# Patient Record
Sex: Female | Born: 1940 | Race: White | Hispanic: No | Smoking: Current every day smoker
Health system: Southern US, Community
[De-identification: ages and names within clinical notes are randomized; demographics above are authoritative.]

## PROBLEM LIST (undated history)

## (undated) DIAGNOSIS — G8929 Other chronic pain: Secondary | ICD-10-CM

## (undated) DIAGNOSIS — N183 Chronic kidney disease, stage 3 unspecified: Secondary | ICD-10-CM

## (undated) DIAGNOSIS — I10 Essential (primary) hypertension: Secondary | ICD-10-CM

## (undated) DIAGNOSIS — K8020 Calculus of gallbladder without cholecystitis without obstruction: Secondary | ICD-10-CM

## (undated) DIAGNOSIS — K635 Polyp of colon: Secondary | ICD-10-CM

## (undated) DIAGNOSIS — G4700 Insomnia, unspecified: Secondary | ICD-10-CM

## (undated) DIAGNOSIS — G629 Polyneuropathy, unspecified: Secondary | ICD-10-CM

## (undated) DIAGNOSIS — I4891 Unspecified atrial fibrillation: Secondary | ICD-10-CM

## (undated) DIAGNOSIS — I499 Cardiac arrhythmia, unspecified: Secondary | ICD-10-CM

## (undated) DIAGNOSIS — F419 Anxiety disorder, unspecified: Secondary | ICD-10-CM

## (undated) DIAGNOSIS — M1990 Unspecified osteoarthritis, unspecified site: Secondary | ICD-10-CM

## (undated) DIAGNOSIS — F32A Depression, unspecified: Secondary | ICD-10-CM

## (undated) DIAGNOSIS — F329 Major depressive disorder, single episode, unspecified: Secondary | ICD-10-CM

## (undated) DIAGNOSIS — M549 Dorsalgia, unspecified: Secondary | ICD-10-CM

## (undated) HISTORY — DX: Calculus of gallbladder without cholecystitis without obstruction: K80.20

## (undated) HISTORY — DX: Essential (primary) hypertension: I10

## (undated) HISTORY — DX: Anxiety disorder, unspecified: F41.9

## (undated) HISTORY — DX: Chronic kidney disease, stage 3 unspecified: N18.30

## (undated) HISTORY — DX: Insomnia, unspecified: G47.00

## (undated) HISTORY — DX: Cardiac arrhythmia, unspecified: I49.9

## (undated) HISTORY — PX: ABDOMINAL HYSTERECTOMY: SHX81

## (undated) HISTORY — DX: Depression, unspecified: F32.A

## (undated) HISTORY — DX: Chronic kidney disease, stage 3 (moderate): N18.3

## (undated) HISTORY — DX: Unspecified atrial fibrillation: I48.91

## (undated) HISTORY — DX: Polyneuropathy, unspecified: G62.9

## (undated) HISTORY — DX: Polyp of colon: K63.5

## (undated) HISTORY — DX: Dorsalgia, unspecified: M54.9

## (undated) HISTORY — DX: Major depressive disorder, single episode, unspecified: F32.9

## (undated) HISTORY — DX: Other chronic pain: G89.29

---

## 2004-12-12 ENCOUNTER — Ambulatory Visit: Payer: Self-pay

## 2004-12-15 ENCOUNTER — Ambulatory Visit: Payer: Self-pay

## 2005-11-21 ENCOUNTER — Ambulatory Visit: Payer: Self-pay

## 2007-01-13 ENCOUNTER — Emergency Department: Payer: Self-pay | Admitting: Emergency Medicine

## 2007-01-17 ENCOUNTER — Ambulatory Visit (HOSPITAL_COMMUNITY): Admission: RE | Admit: 2007-01-17 | Discharge: 2007-01-17 | Payer: Self-pay | Admitting: *Deleted

## 2007-03-04 ENCOUNTER — Ambulatory Visit: Payer: Self-pay | Admitting: Anesthesiology

## 2007-04-01 ENCOUNTER — Ambulatory Visit: Payer: Self-pay | Admitting: Anesthesiology

## 2007-05-05 ENCOUNTER — Ambulatory Visit: Payer: Self-pay | Admitting: Anesthesiology

## 2007-06-18 ENCOUNTER — Ambulatory Visit: Payer: Self-pay | Admitting: Anesthesiology

## 2007-07-02 ENCOUNTER — Ambulatory Visit: Payer: Self-pay | Admitting: *Deleted

## 2007-07-28 ENCOUNTER — Ambulatory Visit: Payer: Self-pay | Admitting: Anesthesiology

## 2007-09-16 ENCOUNTER — Ambulatory Visit: Payer: Self-pay | Admitting: Anesthesiology

## 2007-11-12 ENCOUNTER — Ambulatory Visit: Payer: Self-pay | Admitting: *Deleted

## 2007-11-24 ENCOUNTER — Ambulatory Visit: Payer: Self-pay | Admitting: Gastroenterology

## 2008-05-24 ENCOUNTER — Ambulatory Visit (HOSPITAL_COMMUNITY): Admission: RE | Admit: 2008-05-24 | Discharge: 2008-05-24 | Payer: Self-pay | Admitting: Neurosurgery

## 2008-06-10 ENCOUNTER — Ambulatory Visit (HOSPITAL_COMMUNITY): Admission: RE | Admit: 2008-06-10 | Discharge: 2008-06-10 | Payer: Self-pay | Admitting: Neurosurgery

## 2008-07-09 LAB — HM DEXA SCAN

## 2010-06-04 ENCOUNTER — Encounter: Payer: Self-pay | Admitting: Neurosurgery

## 2010-11-08 ENCOUNTER — Observation Stay: Payer: Self-pay | Admitting: Internal Medicine

## 2010-12-05 ENCOUNTER — Emergency Department: Payer: Self-pay | Admitting: *Deleted

## 2011-02-08 ENCOUNTER — Ambulatory Visit: Payer: Self-pay | Admitting: Urology

## 2011-02-16 ENCOUNTER — Ambulatory Visit: Payer: Self-pay | Admitting: Urology

## 2012-07-18 ENCOUNTER — Encounter: Payer: Self-pay | Admitting: Family Medicine

## 2012-07-18 DIAGNOSIS — F329 Major depressive disorder, single episode, unspecified: Secondary | ICD-10-CM | POA: Insufficient documentation

## 2012-08-04 ENCOUNTER — Telehealth: Payer: Self-pay | Admitting: Family Medicine

## 2012-08-04 NOTE — Telephone Encounter (Signed)
[facility]

## 2012-08-04 NOTE — Telephone Encounter (Signed)
Ok to refill 

## 2012-08-13 ENCOUNTER — Telehealth: Payer: Self-pay | Admitting: Family Medicine

## 2012-08-13 NOTE — Telephone Encounter (Signed)
?  ok to refill °

## 2012-08-13 NOTE — Telephone Encounter (Signed)
Ok to refill on Friday 4/4.  Too soon today

## 2012-08-14 ENCOUNTER — Encounter: Payer: Self-pay | Admitting: Family Medicine

## 2012-08-15 MED ORDER — HYDROCODONE-ACETAMINOPHEN 5-325 MG PO TABS: 1.0000 | ORAL_TABLET | Freq: Two times a day (BID) | ORAL | Status: DC

## 2012-08-15 NOTE — Telephone Encounter (Signed)
rx refilled.

## 2012-08-15 NOTE — Telephone Encounter (Signed)
This encounter was created in error - please disregard.

## 2012-08-17 ENCOUNTER — Other Ambulatory Visit: Payer: Self-pay | Admitting: Family Medicine

## 2012-09-04 ENCOUNTER — Telehealth: Payer: Self-pay | Admitting: Family Medicine

## 2012-09-04 MED ORDER — DIAZEPAM 5 MG PO TABS: 5.0000 mg | ORAL_TABLET | Freq: Two times a day (BID) | ORAL | Status: DC | PRN

## 2012-09-04 NOTE — Telephone Encounter (Signed)
?   OK to Refill  

## 2012-09-04 NOTE — Telephone Encounter (Signed)
Ok to refill 

## 2012-09-04 NOTE — Telephone Encounter (Signed)
Rx Refilled  

## 2012-09-05 ENCOUNTER — Encounter: Payer: Self-pay | Admitting: Family Medicine

## 2012-09-05 NOTE — Telephone Encounter (Signed)
This encounter was created in error - please disregard.

## 2012-09-14 ENCOUNTER — Other Ambulatory Visit: Payer: Self-pay | Admitting: Family Medicine

## 2012-09-15 NOTE — Telephone Encounter (Signed)
?   OK to Refill  

## 2012-09-15 NOTE — Telephone Encounter (Signed)
Ok to refill if it has been 1 month. 

## 2012-10-13 ENCOUNTER — Other Ambulatory Visit: Payer: Self-pay | Admitting: Family Medicine

## 2012-10-13 NOTE — Telephone Encounter (Signed)
Ok to refill 

## 2012-10-13 NOTE — Telephone Encounter (Signed)
?   OK to Refill  

## 2012-10-24 ENCOUNTER — Other Ambulatory Visit: Payer: Self-pay | Admitting: Family Medicine

## 2012-10-24 NOTE — Telephone Encounter (Signed)
Ok to refill 

## 2012-10-24 NOTE — Telephone Encounter (Signed)
?   OK to Refill  

## 2012-10-27 ENCOUNTER — Encounter: Payer: Self-pay | Admitting: Family Medicine

## 2012-10-27 ENCOUNTER — Ambulatory Visit (INDEPENDENT_AMBULATORY_CARE_PROVIDER_SITE_OTHER): Payer: Medicare Other | Admitting: Family Medicine

## 2012-10-27 VITALS — BP 100/62 | HR 78 | Temp 98.2°F | Resp 16 | Wt 172.0 lb

## 2012-10-27 DIAGNOSIS — R630 Anorexia: Secondary | ICD-10-CM

## 2012-10-27 DIAGNOSIS — R300 Dysuria: Secondary | ICD-10-CM

## 2012-10-27 DIAGNOSIS — M25559 Pain in unspecified hip: Secondary | ICD-10-CM

## 2012-10-27 LAB — URINALYSIS, ROUTINE W REFLEX MICROSCOPIC
Ketones, ur: NEGATIVE mg/dL
Nitrite: NEGATIVE
Protein, ur: NEGATIVE mg/dL
Specific Gravity, Urine: 1.02 (ref 1.005–1.030)
Urobilinogen, UA: 0.2 mg/dL (ref 0.0–1.0)

## 2012-10-27 LAB — URINALYSIS, MICROSCOPIC ONLY: Crystals: NONE SEEN

## 2012-10-27 NOTE — Progress Notes (Signed)
Subjective:    Patient ID: Catherine Orozco, female    DOB: 01/24/1941, 72 y.o.   MRN: [id number]  HPI Patient presents with 2 weeks of urinary frequency and hesitancy and mild dysuria. She denies fevers chills or CVA tenderness. She does have some suprapubic discomfort. Urinalysis has shown below and is relatively benign. Office Visit on 10/27/2012  Component Date Value Range Status  . Color, Urine 10/27/2012 YELLOW  YELLOW Final  . APPearance 10/27/2012 CLEAR  CLEAR Final  . Specific Gravity, Urine 10/27/2012 1.020  1.005 - 1.030 Final  . pH 10/27/2012 6.0  5.0 - 8.0 Final  . Glucose, UA 10/27/2012 NEG  NEG mg/dL Final  . Bilirubin Urine 10/27/2012 NEG  NEG Final  . Ketones, ur 10/27/2012 NEG  NEG mg/dL Final  . Hgb urine dipstick 10/27/2012 TRACE* NEG Final  . Protein, ur 10/27/2012 NEG  NEG mg/dL Final  . Urobilinogen, UA 10/27/2012 0.2  0.0 - 1.0 mg/dL Final  . Nitrite 40/98/1191 NEG  NEG Final  . Leukocytes, UA 10/27/2012 NEG  NEG Final  . Squamous Epithelial / LPF 10/27/2012 RARE  RARE Final  . Crystals 10/27/2012 NONE SEEN  NONE SEEN Final  . Casts 10/27/2012 Granular casts noted  NONE SEEN Final  . WBC, UA 10/27/2012 0-2  <3 WBC/hpf Final  . RBC / HPF 10/27/2012 0-2  <3 RBC/hpf Final  . Bacteria, UA 10/27/2012 RARE  RARE Final    However she is also complaining of fatigue, loss of appetite, and severe pain in her right hip. On exam the area she is referring to as her hip however is the superior iliac crest in her right pelvis. It is extremely tender to palpation. She has no pain with range of motion in the right hip. She is also complaining of some right-sided lower back pain.  The patient winces when I palpate the right anterior superior iliac spine. Past Medical History  Diagnosis Date  . Anxiety   . Depression   . 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 Hypertension   . Insomnia   . Peripheral neuropathy     no lumbar radiculopathy   Current Outpatient Prescriptions  on File Prior to Visit  Medication Sig Dispense Refill  . amLODipine (NORVASC) 5 MG tablet Take 5 mg by mouth daily.      . diazepam (VALIUM) 5 MG tablet TAKE 1 TABLET BY MOUTH TWICE A DAY  60 tablet  0  . gabapentin (NEURONTIN) 600 MG tablet Take 600 mg by mouth 2 (two) times daily.      . hydrochlorothiazide (HYDRODIURIL) 25 MG tablet Take 25 mg by mouth daily.      Marland Kitchen HYDROcodone-acetaminophen (NORCO/VICODIN) 5-325 MG per tablet TAKE 1 TABLET BY MOUTH TWICE A DAY  60 tablet  0  . traZODone (DESYREL) 50 MG tablet Take 50 mg by mouth at bedtime. 1-2 qhs prn       No current facility-administered medications on file prior to visit.   Allergies  Allergen Reactions  . Paxil (Paroxetine Hcl)     Can't sleep   History   Social History  . Marital Status: Widowed    Spouse Name: N/A    Number of Children: N/A  . Years of Education: N/A   Occupational History  . Not on file.   Social History Main Topics  . Smoking status: Current Every Day Smoker -- 1.00 packs/day  . Smokeless tobacco: Current User  . Alcohol Use: No  .  Drug Use: No  . Sexually Active: Not on file   Other Topics Concern  . Not on file   Social History Narrative  . No narrative on file     Review of Systems  All other systems reviewed and are negative.       Objective:   Physical Exam  Vitals reviewed. Constitutional: She appears well-developed and well-nourished.  Neck: Neck supple. No thyromegaly present.  Cardiovascular: Normal rate, regular rhythm and normal heart sounds.   No murmur heard. Pulmonary/Chest: Effort normal and breath sounds normal. No respiratory distress. She has no wheezes. She has no rales.  Abdominal: Soft. Bowel sounds are normal. She exhibits no distension. There is no tenderness. There is no rebound and no guarding.  Musculoskeletal:       Right hip: She exhibits tenderness (tender to palpation of the right anterior superior iliac spine) and bony tenderness. She exhibits  normal range of motion, normal strength, no swelling, no crepitus, no deformity and no laceration.  Lymphadenopathy:    She has no cervical adenopathy.          Assessment & Plan:  1. Dysuria Urinalysis is negative, I will await the results of the urine culture. - Urinalysis, Routine w reflex microscopic - Urine culture  2. Pain in joint, pelvic region and thigh, unspecified laterality The patient seems to be having bony pain within the pelvis itself.  I will obtain an x-ray of the pelvis and both hips to rule out skeletal neoplasm or possible occult fracture. - DG Hip Bilateral W/Pelvis; Future - Urine culture  3. Anorexia Given her poor appetite all began with baseline labs including a CMP, CBC and sedimentation rate. I will await the results of these studies prior to formulating a further plan of care. - COMPLETE METABOLIC PANEL WITH GFR - CBC with Differential - Sedimentation rate

## 2012-10-28 ENCOUNTER — Ambulatory Visit
Admission: RE | Admit: 2012-10-28 | Discharge: 2012-10-28 | Disposition: A | Discharge status: Home / Self Care | Payer: Medicare Other | Source: Ambulatory Visit | Attending: Family Medicine | Admitting: Family Medicine

## 2012-10-28 DIAGNOSIS — M25559 Pain in unspecified hip: Secondary | ICD-10-CM

## 2012-10-28 LAB — COMPLETE METABOLIC PANEL WITH GFR
AST: 27 U/L (ref 0–37)
Albumin: 4.2 g/dL (ref 3.5–5.2)
Alkaline Phosphatase: 100 U/L (ref 39–117)
BUN: 14 mg/dL (ref 6–23)
GFR, Est Non African American: 59 mL/min — ABNORMAL LOW
Glucose, Bld: 100 mg/dL — ABNORMAL HIGH (ref 70–99)
Potassium: 3.5 mEq/L (ref 3.5–5.3)
Total Bilirubin: 0.3 mg/dL (ref 0.3–1.2)

## 2012-10-28 LAB — CBC WITH DIFFERENTIAL/PLATELET
Eosinophils Relative: 1 % (ref 0–5)
HCT: 43.4 % (ref 36.0–46.0)
Hemoglobin: 15.4 g/dL — ABNORMAL HIGH (ref 12.0–15.0)
Lymphocytes Relative: 36 % (ref 12–46)
Lymphs Abs: 4.2 10*3/uL — ABNORMAL HIGH (ref 0.7–4.0)
MCV: 86.8 fL (ref 78.0–100.0)
Monocytes Absolute: 0.9 10*3/uL (ref 0.1–1.0)
Monocytes Relative: 7 % (ref 3–12)
Neutro Abs: 6.5 10*3/uL (ref 1.7–7.7)
RBC: 5 MIL/uL (ref 3.87–5.11)
WBC: 11.7 10*3/uL — ABNORMAL HIGH (ref 4.0–10.5)

## 2012-10-30 ENCOUNTER — Other Ambulatory Visit: Payer: Self-pay | Admitting: Family Medicine

## 2012-10-30 MED ORDER — MELOXICAM 15 MG PO TABS: 15.0000 mg | ORAL_TABLET | Freq: Every day | ORAL | Status: DC

## 2012-10-30 NOTE — Telephone Encounter (Signed)
rx filled

## 2012-11-10 ENCOUNTER — Other Ambulatory Visit: Payer: Self-pay | Admitting: Family Medicine

## 2012-11-11 NOTE — Telephone Encounter (Signed)
LOV note and XRay of Pelvis, Hips reviewed.  Approved. # 60 / 0 refills

## 2012-11-11 NOTE — Telephone Encounter (Signed)
?   OK to Refill  

## 2012-11-12 NOTE — Telephone Encounter (Signed)
Med refilled.

## 2012-11-18 ENCOUNTER — Other Ambulatory Visit: Payer: Self-pay | Admitting: Family Medicine

## 2012-11-25 ENCOUNTER — Ambulatory Visit: Payer: Medicare Other | Admitting: Family Medicine

## 2012-11-27 ENCOUNTER — Encounter: Payer: Self-pay | Admitting: Family Medicine

## 2012-11-27 ENCOUNTER — Ambulatory Visit (INDEPENDENT_AMBULATORY_CARE_PROVIDER_SITE_OTHER): Payer: Medicare Other | Admitting: Family Medicine

## 2012-11-27 VITALS — BP 110/68 | HR 74 | Temp 98.3°F | Resp 18 | Wt 174.0 lb

## 2012-11-27 DIAGNOSIS — I4891 Unspecified atrial fibrillation: Secondary | ICD-10-CM

## 2012-11-27 DIAGNOSIS — I499 Cardiac arrhythmia, unspecified: Secondary | ICD-10-CM

## 2012-11-27 LAB — COMPLETE METABOLIC PANEL WITH GFR
BUN: 18 mg/dL (ref 6–23)
CO2: 25 mEq/L (ref 19–32)
Calcium: 10 mg/dL (ref 8.4–10.5)
Chloride: 101 mEq/L (ref 96–112)
Creat: 1.18 mg/dL — ABNORMAL HIGH (ref 0.50–1.10)
GFR, Est African American: 54 mL/min — ABNORMAL LOW
Total Bilirubin: 0.4 mg/dL (ref 0.3–1.2)

## 2012-11-27 LAB — CBC WITH DIFFERENTIAL/PLATELET
Basophils Absolute: 0.1 10*3/uL (ref 0.0–0.1)
Eosinophils Absolute: 0.2 10*3/uL (ref 0.0–0.7)
Eosinophils Relative: 2 % (ref 0–5)
HCT: 43.2 % (ref 36.0–46.0)
Lymphocytes Relative: 36 % (ref 12–46)
Lymphs Abs: 3.8 10*3/uL (ref 0.7–4.0)
MCH: 31.1 pg (ref 26.0–34.0)
MCV: 86.1 fL (ref 78.0–100.0)
Monocytes Absolute: 0.8 10*3/uL (ref 0.1–1.0)
Platelets: 249 10*3/uL (ref 150–400)
RDW: 13 % (ref 11.5–15.5)
WBC: 10.6 10*3/uL — ABNORMAL HIGH (ref 4.0–10.5)

## 2012-11-27 LAB — TSH: TSH: 2.006 u[IU]/mL (ref 0.350–4.500)

## 2012-11-27 MED ORDER — METOPROLOL TARTRATE 25 MG PO TABS: 25.0000 mg | ORAL_TABLET | Freq: Two times a day (BID) | ORAL | Status: DC

## 2012-11-27 MED ORDER — APIXABAN 5 MG PO TABS: 5.0000 mg | ORAL_TABLET | Freq: Two times a day (BID) | ORAL | Status: DC

## 2012-11-27 NOTE — Progress Notes (Signed)
Subjective:    Patient ID: Catherine Orozco, female    DOB: Nov 10, 1940, 72 y.o.   MRN: [id number]  HPI 10/27/12 Patient presents with 2 weeks of urinary frequency and hesitancy and mild dysuria. She denies fevers chills or CVA tenderness. She does have some suprapubic discomfort. Urinalysis has shown below and is relatively benign. No visits with results within 1 Day(s) from this visit. Latest known visit with results is:  Office Visit on 10/27/2012  Component Date Value Range Status  . Color, Urine 10/27/2012 YELLOW  YELLOW Final  . APPearance 10/27/2012 CLEAR  CLEAR Final  . Specific Gravity, Urine 10/27/2012 1.020  1.005 - 1.030 Final  . pH 10/27/2012 6.0  5.0 - 8.0 Final  . Glucose, UA 10/27/2012 NEG  NEG mg/dL Final  . Bilirubin Urine 10/27/2012 NEG  NEG Final  . Ketones, ur 10/27/2012 NEG  NEG mg/dL Final  . Hgb urine dipstick 10/27/2012 TRACE* NEG Final  . Protein, ur 10/27/2012 NEG  NEG mg/dL Final  . Urobilinogen, UA 10/27/2012 0.2  0.0 - 1.0 mg/dL Final  . Nitrite 40/98/1191 NEG  NEG Final  . Leukocytes, UA 10/27/2012 NEG  NEG Final  . Sodium 10/27/2012 139  135 - 145 mEq/L Final  . Potassium 10/27/2012 3.5  3.5 - 5.3 mEq/L Final  . Chloride 10/27/2012 101  96 - 112 mEq/L Final  . CO2 10/27/2012 27  19 - 32 mEq/L Final  . Glucose, Bld 10/27/2012 100* 70 - 99 mg/dL Final  . BUN 47/82/9562 14  6 - 23 mg/dL Final  . Creat 13/12/6576 0.97  0.50 - 1.10 mg/dL Final  . Total Bilirubin 10/27/2012 0.3  0.3 - 1.2 mg/dL Final  . Alkaline Phosphatase 10/27/2012 100  39 - 117 U/L Final  . AST 10/27/2012 27  0 - 37 U/L Final  . ALT 10/27/2012 18  0 - 35 U/L Final  . Total Protein 10/27/2012 6.6  6.0 - 8.3 g/dL Final  . Albumin 46/96/2952 4.2  3.5 - 5.2 g/dL Final  . Calcium 84/13/2440 9.6  8.4 - 10.5 mg/dL Final  . GFR, Est African American 10/27/2012 68   Final  . GFR, Est Non African American 10/27/2012 59*  Final   Comment:                            The estimated GFR is a  calculation valid for adults (>=35 years old)                          that uses the CKD-EPI algorithm to adjust for age and sex. It is                            not to be used for children, pregnant women, hospitalized patients,                             patients on dialysis, or with rapidly changing kidney function.                          According to the NKDEP, eGFR >89 is normal, 60-89 shows mild                          impairment, 30-59 shows moderate impairment,  15-29 shows severe                          impairment and <15 is ESRD.                             . WBC 10/27/2012 11.7* 4.0 - 10.5 K/uL Final  . RBC 10/27/2012 5.00  3.87 - 5.11 MIL/uL Final  . Hemoglobin 10/27/2012 15.4* 12.0 - 15.0 g/dL Final  . HCT 40/98/1191 43.4  36.0 - 46.0 % Final  . MCV 10/27/2012 86.8  78.0 - 100.0 fL Final  . MCH 10/27/2012 30.8  26.0 - 34.0 pg Final  . MCHC 10/27/2012 35.5  30.0 - 36.0 g/dL Final  . RDW 47/82/9562 14.1  11.5 - 15.5 % Final  . Platelets 10/27/2012 292  150 - 400 K/uL Final  . Neutrophils Relative % 10/27/2012 56  43 - 77 % Final  . Neutro Abs 10/27/2012 6.5  1.7 - 7.7 K/uL Final  . Lymphocytes Relative 10/27/2012 36  12 - 46 % Final  . Lymphs Abs 10/27/2012 4.2* 0.7 - 4.0 K/uL Final  . Monocytes Relative 10/27/2012 7  3 - 12 % Final  . Monocytes Absolute 10/27/2012 0.9  0.1 - 1.0 K/uL Final  . Eosinophils Relative 10/27/2012 1  0 - 5 % Final  . Eosinophils Absolute 10/27/2012 0.1  0.0 - 0.7 K/uL Final  . Basophils Relative 10/27/2012 0  0 - 1 % Final  . Basophils Absolute 10/27/2012 0.1  0.0 - 0.1 K/uL Final  . Smear Review 10/27/2012 Criteria for review not met   Final  . Sed Rate 10/27/2012 13  0 - 22 mm/hr Final  . Squamous Epithelial / LPF 10/27/2012 RARE  RARE Final  . Crystals 10/27/2012 NONE SEEN  NONE SEEN Final  . Casts 10/27/2012 Granular casts noted  NONE SEEN Final  . WBC, UA 10/27/2012 0-2  <3 WBC/hpf Final  . RBC / HPF 10/27/2012 0-2  <3 RBC/hpf Final   . Bacteria, UA 10/27/2012 RARE  RARE Final  . Colony Count 10/27/2012 NO GROWTH   Final  . Organism ID, Bacteria 10/27/2012 NO GROWTH   Final    However she is also complaining of fatigue, loss of appetite, and severe pain in her right hip. On exam the area she is referring to as her hip however is the superior iliac crest in her right pelvis. It is extremely tender to palpation. She has no pain with range of motion in the right hip. She is also complaining of some right-sided lower back pain.  The patient winces when I palpate the right anterior superior iliac spine.  11/27/2012 X-ray was normal except for some degenerative changes in the right hip.  We started the patient on meloxicam 15 mg by mouth daily for her hip pain. She states that the pain in her right hip is now essentially resolved. She has full range of motion without pain in the hip. However on her physical exam today there is a coincidental finding of an irregular heartbeat. Past Medical History  Diagnosis Date  . Anxiety   . Depression   . 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 Hypertension   . Insomnia   . Peripheral neuropathy     no lumbar radiculopathy   Current Outpatient Prescriptions on File Prior to Visit  Medication Sig Dispense Refill  . amLODipine (NORVASC)  5 MG tablet Take 5 mg by mouth daily.      . diazepam (VALIUM) 5 MG tablet TAKE 1 TABLET BY MOUTH TWICE A DAY  60 tablet  0  . gabapentin (NEURONTIN) 600 MG tablet Take 600 mg by mouth 2 (two) times daily.      . hydrochlorothiazide (HYDRODIURIL) 25 MG tablet Take 25 mg by mouth daily.      Marland Kitchen HYDROcodone-acetaminophen (NORCO/VICODIN) 5-325 MG per tablet TAKE 1 TABLET BY MOUTH TWICE A DAY AS NEEDED  60 tablet  0  . hydrOXYzine (ATARAX/VISTARIL) 25 MG tablet TAKE 1 TABLET BY MOUTH EVERY EVENING AT BEDTIME (DISCONTINUE TRAZODOONE)  30 tablet  2  . meloxicam (MOBIC) 15 MG tablet Take 1 tablet (15 mg total) by mouth daily.  30 tablet  2  . pyridOXINE (VITAMIN  B-6) 100 MG tablet Take 100 mg by mouth daily.      . traZODone (DESYREL) 50 MG tablet Take 50 mg by mouth at bedtime. 1-2 qhs prn      . vitamin E 400 UNIT capsule Take 400 Units by mouth daily.       No current facility-administered medications on file prior to visit.   Allergies  Allergen Reactions  . Paxil (Paroxetine Hcl)     Can't sleep   History   Social History  . Marital Status: Widowed    Spouse Name: N/A    Number of Children: N/A  . Years of Education: N/A   Occupational History  . Not on file.   Social History Main Topics  . Smoking status: Current Every Day Smoker -- 1.00 packs/day  . Smokeless tobacco: Current User  . Alcohol Use: No  . Drug Use: No  . Sexually Active: Not on file   Other Topics Concern  . Not on file   Social History Narrative  . No narrative on file     Review of Systems  All other systems reviewed and are negative.       Objective:   Physical Exam  Vitals reviewed. Constitutional: She appears well-developed and well-nourished.  Neck: Neck supple. No thyromegaly present.  Cardiovascular: Normal rate and normal heart sounds.  An irregular rhythm present.  No murmur heard. Pulmonary/Chest: Effort normal and breath sounds normal. No respiratory distress. She has no wheezes. She has no rales.  Abdominal: Soft. Bowel sounds are normal. She exhibits no distension. There is no tenderness. There is no rebound and no guarding.  Musculoskeletal:       Right hip: She exhibits normal range of motion, normal strength, no tenderness, no swelling, no crepitus, no deformity and no laceration.  Lymphadenopathy:    She has no cervical adenopathy.    EKG shows an irregularly irregular rhythm with a rate of 102 beats per minute no discernible PR interval normal axis without ischemia or infarction. There are definite P waves but they are irregular in morphology with variable PP intervals.  I cannot rule out frequent PACs but I believe the patient  is in atrial fib.      Assessment & Plan:  1. Irregular heart beat - EKG 12-Lead  2. Atrial fibrillation Begin Eliquis 5 mg pobid.  Discontinue Mobic.  Begin Lopressor 25 mg twice a day.  Consult cardiology for an echocardiogram. - CBC with Differential - COMPLETE METABOLIC PANEL WITH GFR - TSH - metoprolol tartrate (LOPRESSOR) 25 MG tablet; Take 1 tablet (25 mg total) by mouth 2 (two) times daily.  Dispense: 180 tablet; Refill: 3

## 2012-12-01 ENCOUNTER — Other Ambulatory Visit: Payer: Self-pay | Admitting: Family Medicine

## 2012-12-02 NOTE — Telephone Encounter (Signed)
Ok to refill 

## 2012-12-02 NOTE — Telephone Encounter (Signed)
Med phoned in °

## 2012-12-10 ENCOUNTER — Ambulatory Visit: Payer: Medicare Other | Admitting: Cardiology

## 2012-12-10 ENCOUNTER — Other Ambulatory Visit: Payer: Self-pay | Admitting: Family Medicine

## 2012-12-10 NOTE — Telephone Encounter (Signed)
?   OK to Refill  

## 2012-12-11 NOTE — Telephone Encounter (Signed)
Ok to refill 

## 2012-12-11 NOTE — Telephone Encounter (Signed)
Med refilled.

## 2012-12-23 ENCOUNTER — Ambulatory Visit (INDEPENDENT_AMBULATORY_CARE_PROVIDER_SITE_OTHER): Payer: Medicare Other | Admitting: Cardiovascular Disease

## 2012-12-23 ENCOUNTER — Encounter: Payer: Self-pay | Admitting: Cardiovascular Disease

## 2012-12-23 VITALS — BP 112/70 | HR 57 | Ht 65.0 in | Wt 170.1 lb

## 2012-12-23 DIAGNOSIS — I4891 Unspecified atrial fibrillation: Secondary | ICD-10-CM

## 2012-12-23 DIAGNOSIS — F17200 Nicotine dependence, unspecified, uncomplicated: Secondary | ICD-10-CM | POA: Insufficient documentation

## 2012-12-23 DIAGNOSIS — I10 Essential (primary) hypertension: Secondary | ICD-10-CM

## 2012-12-23 DIAGNOSIS — Z79899 Other long term (current) drug therapy: Secondary | ICD-10-CM

## 2012-12-23 HISTORY — DX: Unspecified atrial fibrillation: I48.91

## 2012-12-23 NOTE — Assessment & Plan Note (Addendum)
We had a long discussion about atrial fibrillation . Asymptomatic paroxysmal atrial fibrillation, last seen on EKG in July 2014. Back in normal sinus rhythm today.  We will order an echocardiogram. Encouraged her to stay on eliquis and we have given samples, coupon card, and patient assistance paperwork. Suggested she also look at the Price of pradaxa or xarelto with her pharmacist.

## 2012-12-23 NOTE — Assessment & Plan Note (Signed)
We have encouraged her to continue to work on weaning her cigarettes and smoking cessation. She will continue to work on this and does not want any assistance with chantix.  

## 2012-12-23 NOTE — Patient Instructions (Addendum)
Please ask the pharmacy which is the cheapest: Eliquis, pradaxa or xarelto  Please cut the amlodipine in 1/2 daily If blood pressure continues to drop low, hold the amlodipine  We will order an echocardiogram for atrial fibrillation Lab draw when you come in for echocardiogram  Please call us if you have new issues that need to be addressed before your next appt.  Your physician wants you to follow-up in: 6 months.  You will receive a reminder letter in the mail two months in advance. If you don't receive a letter, please call our office to schedule the follow-up appointment.

## 2012-12-23 NOTE — Progress Notes (Signed)
Patient ID: Catherine Orozco, female    DOB: 10/29/1940, 72 y.o.   MRN: [id number]  HPI Comments: Catherine Orozco is a very pleasant 72 year old woman with a history of smoking for 50 years, lower extremity neuropathy, DJD per the patient/chronic back pain,  strong family history of heart disease with father who passed away from MI at age 66, mother with stroke in her 71s, who presents by referral from Dr. Tanya Nones  for atrial fibrillation.  She was recently seen in his clinic in July 2014 with an EKG showing atrial fibrillation. She was asymptomatic per the patient. She denies any lower extremity edema, shortness of breath, orthopnea, chest pain. She is unaware that she was having arrhythmia. She presents today with her daughter. Daughter reports that mother has otherwise been doing well, is relatively active with no complaints.   At her office visit with Dr. Tanya Nones him a she was started on metoprolol 25 mg twice a day, eliquis 5 mg twice a day. She reports that she is unable to afford the eliquis as it is $70.  EKG today shows normal sinus rhythm with rate 57 beats per minute, no significant ST or T wave changes    Outpatient Encounter Prescriptions as of 12/23/2012  Medication Sig Dispense Refill  . amLODipine (NORVASC) 5 MG tablet Take 5 mg by mouth daily.      Marland Kitchen apixaban (ELIQUIS) 5 MG TABS tablet Take 1 tablet (5 mg total) by mouth 2 (two) times daily.  60 tablet  5  . diazepam (VALIUM) 5 MG tablet TAKE 1 TABLET BY MOUTH TWICE A DAY  60 tablet  0  . gabapentin (NEURONTIN) 600 MG tablet Take 600 mg by mouth 2 (two) times daily.      . hydrochlorothiazide (HYDRODIURIL) 25 MG tablet Take 25 mg by mouth daily.      Marland Kitchen HYDROcodone-acetaminophen (NORCO/VICODIN) 5-325 MG per tablet TAKE 1 TABLET BY MOUTH TWICE A DAY AS NEEDED  60 tablet  0  . hydrOXYzine (ATARAX/VISTARIL) 25 MG tablet TAKE 1 TABLET BY MOUTH EVERY EVENING AT BEDTIME (DISCONTINUE TRAZODOONE)  30 tablet  2  . metoprolol tartrate  (LOPRESSOR) 25 MG tablet Take 1 tablet (25 mg total) by mouth 2 (two) times daily.  180 tablet  3  . pyridOXINE (VITAMIN B-6) 100 MG tablet Take 100 mg by mouth daily.      . vitamin E 400 UNIT capsule Take 400 Units by mouth daily.      . [DISCONTINUED] traZODone (DESYREL) 50 MG tablet Take 50 mg by mouth at bedtime. 1-2 qhs prn       No facility-administered encounter medications on file as of 12/23/2012.     Review of Systems  Constitutional: Negative.   HENT: Negative.   Eyes: Negative.   Respiratory: Negative.   Cardiovascular: Negative.   Gastrointestinal: Negative.   Musculoskeletal: Negative.   Skin: Negative.   Neurological: Negative.   Psychiatric/Behavioral: Negative.   All other systems reviewed and are negative.    BP 112/70  Pulse 57  Ht 5\' 5"  (1.651 m)  Wt 170 lb 1.9 oz (77.166 kg)  BMI 28.31 kg/m2  Physical Exam  Nursing note and vitals reviewed. Constitutional: She is oriented to person, place, and time. She appears well-developed and well-nourished.  HENT:  Head: Normocephalic.  Nose: Nose normal.  Mouth/Throat: Oropharynx is clear and moist.  Eyes: Conjunctivae are normal. Pupils are equal, round, and reactive to light.  Neck: Normal range of motion. Neck supple. No JVD  present.  Cardiovascular: Normal rate, regular rhythm, S1 normal, S2 normal, normal heart sounds and intact distal pulses.  Exam reveals no gallop and no friction rub.   No murmur heard. Pulmonary/Chest: Effort normal and breath sounds normal. No respiratory distress. She has no wheezes. She has no rales. She exhibits no tenderness.  Abdominal: Soft. Bowel sounds are normal. She exhibits no distension. There is no tenderness.  Musculoskeletal: Normal range of motion. She exhibits no edema and no tenderness.  Lymphadenopathy:    She has no cervical adenopathy.  Neurological: She is alert and oriented to person, place, and time. Coordination normal.  Skin: Skin is warm and dry. No rash  noted. No erythema.  Psychiatric: She has a normal mood and affect. Her behavior is normal. Judgment and thought content normal.    Assessment and Plan

## 2012-12-23 NOTE — Assessment & Plan Note (Signed)
Blood pressure running on the low side. We will decrease her amlodipine down to 2.5 mg daily. If blood pressure continues to run low, could hold the amlodipine. This could potentially be contributing to her foot swelling.

## 2013-01-05 ENCOUNTER — Other Ambulatory Visit: Payer: Self-pay | Admitting: Family Medicine

## 2013-01-05 NOTE — Telephone Encounter (Signed)
ok 

## 2013-01-05 NOTE — Telephone Encounter (Signed)
?   OK to Refill  

## 2013-01-07 ENCOUNTER — Other Ambulatory Visit: Payer: Self-pay | Admitting: Family Medicine

## 2013-01-08 ENCOUNTER — Telehealth: Payer: Self-pay | Admitting: Family Medicine

## 2013-01-08 NOTE — Telephone Encounter (Signed)
Has form for patient assistance for Eluquis.  Told to drop off and we can complete and fax to company.

## 2013-01-08 NOTE — Telephone Encounter (Signed)
Last OV 11/27/12  Last refill 12/10/12  #60  OK refill?

## 2013-01-08 NOTE — Telephone Encounter (Signed)
rx called in

## 2013-01-08 NOTE — Telephone Encounter (Signed)
ok 

## 2013-01-23 ENCOUNTER — Ambulatory Visit (INDEPENDENT_AMBULATORY_CARE_PROVIDER_SITE_OTHER): Payer: Medicare Other

## 2013-01-23 ENCOUNTER — Other Ambulatory Visit: Payer: Self-pay

## 2013-01-23 ENCOUNTER — Other Ambulatory Visit (INDEPENDENT_AMBULATORY_CARE_PROVIDER_SITE_OTHER): Payer: Medicare Other

## 2013-01-23 DIAGNOSIS — I4891 Unspecified atrial fibrillation: Secondary | ICD-10-CM

## 2013-01-23 DIAGNOSIS — Z79899 Other long term (current) drug therapy: Secondary | ICD-10-CM

## 2013-01-24 LAB — LIPID PANEL
Cholesterol, Total: 237 mg/dL — ABNORMAL HIGH (ref 100–199)
HDL: 38 mg/dL — ABNORMAL LOW (ref >39)
Triglycerides: 637 mg/dL (ref 0–149)

## 2013-01-27 ENCOUNTER — Telehealth: Payer: Self-pay

## 2013-01-27 NOTE — Telephone Encounter (Signed)
Spoke w/ Gunnar Fusi. Will leave samples of Eliquis 5 mg at the front desk.   Will call with echo results after Dr. Mariah Milling has reviewed.

## 2013-01-27 NOTE — Telephone Encounter (Signed)
Pt would like samples of Eliquis, also would like echo results.

## 2013-01-30 ENCOUNTER — Telehealth: Payer: Self-pay | Admitting: Family Medicine

## 2013-01-30 MED ORDER — APIXABAN 5 MG PO TABS: 5.0000 mg | ORAL_TABLET | Freq: Two times a day (BID) | ORAL | Status: DC

## 2013-01-30 NOTE — Telephone Encounter (Signed)
rx was printed and faxed

## 2013-01-30 NOTE — Telephone Encounter (Signed)
Eliquis 5 mg 1 BID #180  This needs to be sent to [facility], [id number], [place], [place] [address], [phone], [phone]. (Sorry, I could not find it in the pharmacy list)

## 2013-02-02 ENCOUNTER — Telehealth: Payer: Self-pay

## 2013-02-02 NOTE — Telephone Encounter (Signed)
Message copied by Marilynne Halsted on Mon Feb 02, 2013  8:20 AM ------      Message from: Antonieta Iba      Created: Thu Jan 29, 2013 10:23 PM       Recent Normal echo ------

## 2013-02-04 ENCOUNTER — Telehealth: Payer: Self-pay

## 2013-02-04 NOTE — Telephone Encounter (Signed)
Pt notified of normal ECHO results.

## 2013-02-06 ENCOUNTER — Encounter: Payer: Self-pay | Admitting: Family Medicine

## 2013-02-06 ENCOUNTER — Other Ambulatory Visit: Payer: Self-pay | Admitting: Family Medicine

## 2013-02-06 NOTE — Telephone Encounter (Signed)
ok 

## 2013-02-06 NOTE — Telephone Encounter (Signed)
?   OK to Refill  

## 2013-02-16 ENCOUNTER — Other Ambulatory Visit: Payer: Self-pay | Admitting: Family Medicine

## 2013-02-16 NOTE — Telephone Encounter (Signed)
?   OK to Refill  

## 2013-02-16 NOTE — Telephone Encounter (Signed)
ok 

## 2013-02-17 ENCOUNTER — Other Ambulatory Visit: Payer: Self-pay | Admitting: Family Medicine

## 2013-02-17 NOTE — Telephone Encounter (Signed)
Just refilled yesterday.  Pharmacy called never received, verbal order given

## 2013-02-27 ENCOUNTER — Other Ambulatory Visit: Payer: Self-pay | Admitting: Physician Assistant

## 2013-02-27 ENCOUNTER — Telehealth: Payer: Self-pay

## 2013-02-27 MED ORDER — FENOFIBRATE 145 MG PO TABS: 145.0000 mg | ORAL_TABLET | Freq: Every day | ORAL | Status: DC

## 2013-02-27 NOTE — Telephone Encounter (Signed)
Spoke w/ pt.  She is aware of results. She is agreeable to starting fenofibrate 145 mg po daily and have lab work in about 6 weeks.

## 2013-02-27 NOTE — Telephone Encounter (Signed)
Message copied by Marilynne Halsted on Fri Feb 27, 2013  3:59 PM ------      Message from: Odella Aquas A      Created: Fri Feb 27, 2013  3:23 PM       Markedly elevated triglycerides. Recommend diet, exercise and smoking cessation. Likely needs fibrate and even statin based on history. Start fibrate first, if tolerates well, consider adding statin to avoid risk of myopathy/rhabdo. Start fenofibrate 145mg  PO daily. Recheck lipids & LFTs in 6 weeks after starting. LFTs normal 11/2012. ------

## 2013-03-05 ENCOUNTER — Telehealth: Payer: Self-pay | Admitting: Family Medicine

## 2013-03-05 MED ORDER — HYDROCODONE-ACETAMINOPHEN 5-325 MG PO TABS: ORAL_TABLET | ORAL | Status: DC

## 2013-03-05 NOTE — Telephone Encounter (Signed)
ok 

## 2013-03-05 NOTE — Telephone Encounter (Signed)
Rx printed ready for approval signature and pt aware

## 2013-03-05 NOTE — Telephone Encounter (Signed)
Ok to refill Norco

## 2013-03-05 NOTE — Telephone Encounter (Signed)
Needs her NORCO/VICODIN refilled

## 2013-04-02 ENCOUNTER — Other Ambulatory Visit: Payer: Self-pay | Admitting: Family Medicine

## 2013-04-02 NOTE — Telephone Encounter (Signed)
?   OK to Refill  

## 2013-04-02 NOTE — Telephone Encounter (Signed)
ok 

## 2013-04-06 ENCOUNTER — Telehealth: Payer: Self-pay | Admitting: Family Medicine

## 2013-04-06 NOTE — Telephone Encounter (Signed)
?   OK to Refill  

## 2013-04-06 NOTE — Telephone Encounter (Signed)
Patient needs her NORCO/VICODIN refilled

## 2013-04-07 MED ORDER — HYDROCODONE-ACETAMINOPHEN 5-325 MG PO TABS: ORAL_TABLET | ORAL | Status: DC

## 2013-04-07 NOTE — Telephone Encounter (Signed)
RX printed, left up front and patient aware to pick up  

## 2013-04-07 NOTE — Telephone Encounter (Signed)
ok 

## 2013-04-20 ENCOUNTER — Other Ambulatory Visit: Payer: Self-pay | Admitting: Family Medicine

## 2013-04-21 NOTE — Telephone Encounter (Signed)
ok 

## 2013-04-21 NOTE — Telephone Encounter (Signed)
rx sent

## 2013-04-21 NOTE — Telephone Encounter (Signed)
?   OK to Refill  

## 2013-04-28 ENCOUNTER — Telehealth: Payer: Self-pay | Admitting: Family Medicine

## 2013-04-28 NOTE — Telephone Encounter (Signed)
ok 

## 2013-04-28 NOTE — Telephone Encounter (Signed)
Pt daughter is calling because the mother is needing a refill on her hydrocodone its not due till the 25th but we are out of the office and she wants to know if she can go ahead and pick it up Call back number is  [phone]

## 2013-04-28 NOTE — Telephone Encounter (Signed)
?   OK to Refill  

## 2013-04-29 MED ORDER — HYDROCODONE-ACETAMINOPHEN 5-325 MG PO TABS: ORAL_TABLET | ORAL | Status: DC

## 2013-04-29 NOTE — Telephone Encounter (Signed)
Informed dtr that it is fine for her to get rxs early per Dr. Tanya Nones. rxs printed, signed and left up front to be picked by at her convenience due to the office being closed for the holidays.

## 2013-05-11 ENCOUNTER — Other Ambulatory Visit: Payer: Self-pay | Admitting: Family Medicine

## 2013-05-20 ENCOUNTER — Telehealth: Payer: Self-pay | Admitting: Family Medicine

## 2013-05-20 MED ORDER — HYDROCHLOROTHIAZIDE 25 MG PO TABS: 25.0000 mg | ORAL_TABLET | Freq: Every day | ORAL | Status: DC

## 2013-05-20 MED ORDER — GABAPENTIN 600 MG PO TABS: 600.0000 mg | ORAL_TABLET | Freq: Two times a day (BID) | ORAL | Status: DC

## 2013-05-20 MED ORDER — AMLODIPINE BESYLATE 5 MG PO TABS: 5.0000 mg | ORAL_TABLET | Freq: Every day | ORAL | Status: DC

## 2013-05-20 NOTE — Telephone Encounter (Signed)
Pt switched insurances and she is now going to use [facility] mail order and she has a form that needs to be sent in with the prescriptions is that something that we will send or does she need to come pick it up she is wanting to know Pt is needing refills on Gabapentin, Norvasc, and Hydrochlorothiazide Call [name] at  [phone]

## 2013-05-20 NOTE — Telephone Encounter (Signed)
Med sent to pharm and [facility] aware per vm

## 2013-06-22 ENCOUNTER — Other Ambulatory Visit: Payer: Self-pay | Admitting: Family Medicine

## 2013-06-22 NOTE — Telephone Encounter (Signed)
?   OK to Refill  

## 2013-06-23 NOTE — Telephone Encounter (Signed)
ok 

## 2013-06-29 ENCOUNTER — Ambulatory Visit: Payer: Medicare Other | Admitting: Cardiovascular Disease

## 2013-07-04 ENCOUNTER — Other Ambulatory Visit: Payer: Self-pay | Admitting: Physician Assistant

## 2013-07-07 ENCOUNTER — Ambulatory Visit: Payer: Medicare Other | Admitting: Cardiovascular Disease

## 2013-07-09 ENCOUNTER — Ambulatory Visit: Payer: Self-pay | Admitting: Cardiovascular Disease

## 2013-07-16 ENCOUNTER — Other Ambulatory Visit: Payer: Self-pay | Admitting: Family Medicine

## 2013-07-16 ENCOUNTER — Telehealth: Payer: Self-pay | Admitting: *Deleted

## 2013-07-16 NOTE — Telephone Encounter (Signed)
Medication filled x1 with no refills.   Requires office visit before any further refills can be given.   Letter sent.  

## 2013-07-16 NOTE — Telephone Encounter (Signed)
Received fax from [facility] requesting PA for Hydroxyzine. PA submitted.

## 2013-07-17 NOTE — Telephone Encounter (Signed)
Approved until 07/17/2014 - faxed to pharmacy.

## 2013-07-29 ENCOUNTER — Telehealth: Payer: Self-pay | Admitting: Family Medicine

## 2013-07-29 NOTE — Telephone Encounter (Signed)
Call back number is [phone] Pt is needing her hydrocodone refilled

## 2013-07-30 NOTE — Telephone Encounter (Signed)
?   OK to Refill  

## 2013-07-30 NOTE — Telephone Encounter (Signed)
ok 

## 2013-07-31 MED ORDER — HYDROCODONE-ACETAMINOPHEN 5-325 MG PO TABS: ORAL_TABLET | ORAL | Status: DC

## 2013-07-31 NOTE — Telephone Encounter (Signed)
Med printed ready for provider signature and pt pic up

## 2013-08-04 ENCOUNTER — Ambulatory Visit (INDEPENDENT_AMBULATORY_CARE_PROVIDER_SITE_OTHER): Payer: Medicare Other | Admitting: Family Medicine

## 2013-08-04 ENCOUNTER — Encounter: Payer: Self-pay | Admitting: Family Medicine

## 2013-08-04 VITALS — BP 126/74 | HR 78 | Temp 97.5°F | Resp 16 | Ht 65.0 in | Wt 179.0 lb

## 2013-08-04 DIAGNOSIS — G589 Mononeuropathy, unspecified: Secondary | ICD-10-CM

## 2013-08-04 DIAGNOSIS — I4891 Unspecified atrial fibrillation: Secondary | ICD-10-CM

## 2013-08-04 DIAGNOSIS — G629 Polyneuropathy, unspecified: Secondary | ICD-10-CM

## 2013-08-04 DIAGNOSIS — E785 Hyperlipidemia, unspecified: Secondary | ICD-10-CM

## 2013-08-04 LAB — CBC WITH DIFFERENTIAL/PLATELET
Basophils Absolute: 0 10*3/uL (ref 0.0–0.1)
Basophils Relative: 0 % (ref 0–1)
Eosinophils Absolute: 0.1 10*3/uL (ref 0.0–0.7)
Eosinophils Relative: 1 % (ref 0–5)
HCT: 44.1 % (ref 36.0–46.0)
Hemoglobin: 16 g/dL — ABNORMAL HIGH (ref 12.0–15.0)
LYMPHS PCT: 36 % (ref 12–46)
Lymphs Abs: 3.1 10*3/uL (ref 0.7–4.0)
MCH: 30.1 pg (ref 26.0–34.0)
MCHC: 36.3 g/dL — ABNORMAL HIGH (ref 30.0–36.0)
MCV: 83.1 fL (ref 78.0–100.0)
Monocytes Absolute: 0.5 10*3/uL (ref 0.1–1.0)
Monocytes Relative: 6 % (ref 3–12)
NEUTROS ABS: 5 10*3/uL (ref 1.7–7.7)
Neutrophils Relative %: 57 % (ref 43–77)
PLATELETS: 360 10*3/uL (ref 150–400)
RBC: 5.31 MIL/uL — ABNORMAL HIGH (ref 3.87–5.11)
RDW: 13.8 % (ref 11.5–15.5)
WBC: 8.7 10*3/uL (ref 4.0–10.5)

## 2013-08-04 MED ORDER — HYDROCODONE-ACETAMINOPHEN 5-325 MG PO TABS: ORAL_TABLET | ORAL | Status: DC

## 2013-08-04 MED ORDER — LYRICA 100 MG PO CAPS: ORAL_CAPSULE | ORAL | Status: DC

## 2013-08-04 NOTE — Progress Notes (Signed)
Subjective:    Patient ID: Catherine Orozco, female    DOB: 1940-05-21, 73 y.o.   MRN: [id number]  HPI  Patient has a history of atrial fibrillation. However today she is in normal sinus rhythm. He denies any palpitations. She cannot feel any change in her heart rhythm or heart rate. He denies any presyncope or syncope. She denies any chest pain shortness of breath or dyspnea on exertion. She continues to take eliquis 5 mg by mouth twice a day for stroke prevention. She also has a history of hypertension. Her blood pressures well controlled today at 126/74. She also has a history of hyperlipidemia/hypertriglyceridemia. She is currently on fenofibrate 145 mg by mouth daily. She denies any myalgias or right upper quadrant pain. She is due for a fasting lipid panel. She also has a history of chronic low back pain and neuropathic pain due to  peripheral neuropathy.  She is currently on gabapentin 600 mg by mouth twice a day and Norco 5/325 one by mouth twice a day. She is requesting that we increase the Norco to 3 times a day because the pain returns during the middle of the day and is severe. She has failed Cymbalta. She has also seen a neurologist but did not experience any relief from her pain.  She does not believe she has tried Lyrica. Past Medical History  Diagnosis Date  . Anxiety   . Depression   . 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 Hypertension   . Insomnia   . Peripheral neuropathy     no lumbar radiculopathy  . A-fib    Current Outpatient Prescriptions on File Prior to Visit  Medication Sig Dispense Refill  . amLODipine (NORVASC) 5 MG tablet Take 1 tablet (5 mg total) by mouth daily.  90 tablet  1  . apixaban (ELIQUIS) 5 MG TABS tablet Take 1 tablet (5 mg total) by mouth 2 (two) times daily.  180 tablet  4  . diazepam (VALIUM) 5 MG tablet TAKE 1 TABLET BY MOUTH TWICE A DAY AS NEEDED  60 tablet  2  . fenofibrate (TRICOR) 145 MG tablet TAKE 1 TABLET BY MOUTH EVERY DAY  30 tablet  3    . gabapentin (NEURONTIN) 600 MG tablet Take 1 tablet (600 mg total) by mouth 2 (two) times daily.  180 tablet  1  . hydrochlorothiazide (HYDRODIURIL) 25 MG tablet Take 1 tablet (25 mg total) by mouth daily.  90 tablet  1  . hydrOXYzine (ATARAX/VISTARIL) 25 MG tablet TAKE 1 TABLET BY MOUTH AT BEDTIME (DISCONTINUE TRAZODONE)  30 tablet  0  . metoprolol tartrate (LOPRESSOR) 25 MG tablet Take 1 tablet (25 mg total) by mouth 2 (two) times daily.  180 tablet  3  . pyridOXINE (VITAMIN B-6) 100 MG tablet Take 100 mg by mouth daily.      . vitamin E 400 UNIT capsule Take 400 Units by mouth daily.       No current facility-administered medications on file prior to visit.   Allergies  Allergen Reactions  . Paxil [Paroxetine Hcl]     Can't sleep   History   Social History  . Marital Status: Widowed    Spouse Name: N/A    Number of Children: N/A  . Years of Education: N/A   Occupational History  . Not on file.   Social History Main Topics  . Smoking status: Current Every Day Smoker -- 1.00 packs/day for 55 years  . Smokeless  tobacco: Current User  . Alcohol Use: No  . Drug Use: No  . Sexual Activity: Not on file   Other Topics Concern  . Not on file   Social History Narrative  . No narrative on file     Review of Systems  All other systems reviewed and are negative.       Objective:   Physical Exam  Vitals reviewed. Cardiovascular: Normal rate, regular rhythm and normal heart sounds.  Exam reveals no gallop and no friction rub.   No murmur heard. Pulmonary/Chest: Effort normal and breath sounds normal. No respiratory distress. She has no wheezes. She has no rales. She exhibits no tenderness.  Abdominal: Soft. Bowel sounds are normal. She exhibits no distension and no mass. There is no tenderness. There is no rebound and no guarding.  Musculoskeletal: She exhibits no edema.          Assessment & Plan:  1. HLD (hyperlipidemia) Check a CBC and fasting lipid panel and  CMP. Goal LDL is less than 130. Patient's blood pressure is well controlled. Will not make any changes to her blood pressure medication - CBC with Differential - Lipid panel - COMPLETE METABOLIC PANEL WITH GFR  2. Atrial fibrillation Patient is currently in normal sinus rhythm. I believe the patient may benefit from a one-month event monitor to see if she is to he experiencing paroxysmal A. fib ablation. If not we could discontinue eliquis.  I have recommended that she discuss this with her cardiologist.  3. Neuropathy Continue Vicodin BID and discontinue gabapentin and replace with Lyrica 100 mg by mouth twice a day. If this does not benefit the patient I would increase the Norco to 3 times a day.

## 2013-08-05 LAB — LIPID PANEL
CHOLESTEROL: 228 mg/dL — AB (ref 0–200)
HDL: 48 mg/dL (ref >39)
Total CHOL/HDL Ratio: 4.8 Ratio
Triglycerides: 537 mg/dL — ABNORMAL HIGH (ref <150)

## 2013-08-05 LAB — COMPLETE METABOLIC PANEL WITH GFR
ALBUMIN: 4.4 g/dL (ref 3.5–5.2)
ALK PHOS: 45 U/L (ref 39–117)
ALT: 18 U/L (ref 0–35)
AST: 30 U/L (ref 0–37)
BUN: 15 mg/dL (ref 6–23)
CO2: 24 mEq/L (ref 19–32)
CREATININE: 1 mg/dL (ref 0.50–1.10)
Calcium: 9.9 mg/dL (ref 8.4–10.5)
Chloride: 100 mEq/L (ref 96–112)
GFR, Est African American: 65 mL/min
GFR, Est Non African American: 57 mL/min — ABNORMAL LOW
Glucose, Bld: 127 mg/dL — ABNORMAL HIGH (ref 70–99)
POTASSIUM: 3.7 meq/L (ref 3.5–5.3)
Sodium: 139 mEq/L (ref 135–145)
Total Bilirubin: 0.4 mg/dL (ref 0.2–1.2)
Total Protein: 6.9 g/dL (ref 6.0–8.3)

## 2013-08-07 ENCOUNTER — Ambulatory Visit (INDEPENDENT_AMBULATORY_CARE_PROVIDER_SITE_OTHER): Payer: Medicare Other | Admitting: Cardiovascular Disease

## 2013-08-07 ENCOUNTER — Encounter: Payer: Self-pay | Admitting: Cardiovascular Disease

## 2013-08-07 VITALS — BP 132/72 | HR 60 | Ht 65.0 in | Wt 179.5 lb

## 2013-08-07 DIAGNOSIS — M549 Dorsalgia, unspecified: Secondary | ICD-10-CM

## 2013-08-07 DIAGNOSIS — E785 Hyperlipidemia, unspecified: Secondary | ICD-10-CM

## 2013-08-07 DIAGNOSIS — I4891 Unspecified atrial fibrillation: Secondary | ICD-10-CM

## 2013-08-07 DIAGNOSIS — E7849 Other hyperlipidemia: Secondary | ICD-10-CM | POA: Insufficient documentation

## 2013-08-07 DIAGNOSIS — F17200 Nicotine dependence, unspecified, uncomplicated: Secondary | ICD-10-CM

## 2013-08-07 DIAGNOSIS — G8929 Other chronic pain: Secondary | ICD-10-CM

## 2013-08-07 DIAGNOSIS — I10 Essential (primary) hypertension: Secondary | ICD-10-CM

## 2013-08-07 MED ORDER — ATORVASTATIN CALCIUM 10 MG PO TABS: 10.0000 mg | ORAL_TABLET | Freq: Every day | ORAL | Status: DC

## 2013-08-07 MED ORDER — FENOFIBRATE 145 MG PO TABS: 145.0000 mg | ORAL_TABLET | Freq: Every day | ORAL | Status: DC

## 2013-08-07 NOTE — Assessment & Plan Note (Signed)
She is maintaining normal sinus rhythm. She has history of asymptomatic atrial fibrillation. As she is unaware when she has arrhythmia, would recommend she continue her current medications.

## 2013-08-07 NOTE — Progress Notes (Signed)
Patient ID: Catherine Orozco, female    DOB: 08/04/1940, 73 y.o.   MRN: [id number]  HPI Comments: Catherine Orozco is a very pleasant 73 year old woman with a history of smoking for 50 years, lower extremity neuropathy, DJD per the patient/chronic back pain,  strong family history of heart disease with father who passed away from MI at age 38, mother with stroke in her 45s, history of atrial fibrillation.  seen in his clinic in July 2014 with an EKG showing atrial fibrillation. She was asymptomatic.  No lower extremity edema, shortness of breath, orthopnea, chest pain. She was unaware that she was having arrhythmia.   Underlies clinic visit, she was doing well with no complaints. Was active at baseline  She presents today by herself. Again with no new cardiac complaints. She continues to smoke one pack per day. Significant back pain, neuropathy, leg pain. She takes pain medication She is tolerating anticoagulation and metoprolol twice a day  EKG today shows normal sinus rhythm with rate 60 beats per minute, no significant ST or T wave changes    Outpatient Encounter Prescriptions as of 08/07/2013  Medication Sig  . amLODipine (NORVASC) 5 MG tablet Take 1 tablet (5 mg total) by mouth daily.  Marland Kitchen apixaban (ELIQUIS) 5 MG TABS tablet Take 1 tablet (5 mg total) by mouth 2 (two) times daily.  . diazepam (VALIUM) 5 MG tablet TAKE 1 TABLET BY MOUTH TWICE A DAY AS NEEDED  . fenofibrate (TRICOR) 145 MG tablet TAKE 1 TABLET BY MOUTH EVERY DAY  . gabapentin (NEURONTIN) 600 MG tablet Take 1 tablet (600 mg total) by mouth 2 (two) times daily.  Marland Kitchen HYDROcodone-acetaminophen (NORCO/VICODIN) 5-325 MG per tablet TAKE 1 TABLET BY MOUTH TWICE A DAY AS NEEDED  . HYDROcodone-acetaminophen (NORCO/VICODIN) 5-325 MG per tablet TAKE 1 TABLET BY MOUTH TWICE A DAY AS NEEDED  . hydrOXYzine (ATARAX/VISTARIL) 25 MG tablet TAKE 1 TABLET BY MOUTH AT BEDTIME (DISCONTINUE TRAZODONE)  . metoprolol tartrate (LOPRESSOR) 25 MG tablet  Take 1 tablet (25 mg total) by mouth 2 (two) times daily.  Marland Kitchen pyridOXINE (VITAMIN B-6) 100 MG tablet Take 100 mg by mouth daily.  . vitamin E 400 UNIT capsule Take 400 Units by mouth daily.   Review of Systems  Constitutional: Negative.   HENT: Negative.   Eyes: Negative.   Respiratory: Negative.   Cardiovascular: Negative.   Gastrointestinal: Negative.   Endocrine: Negative.   Musculoskeletal: Positive for arthralgias and back pain.  Skin: Negative.   Allergic/Immunologic: Negative.   Neurological: Negative.   Hematological: Negative.   Psychiatric/Behavioral: Negative.   All other systems reviewed and are negative.    BP 132/72  Pulse 60  Ht 5\' 5"  (1.651 m)  Wt 179 lb 8 oz (81.421 kg)  BMI 29.87 kg/m2  Physical Exam  Nursing note and vitals reviewed. Constitutional: She is oriented to person, place, and time. She appears well-developed and well-nourished.  HENT:  Head: Normocephalic.  Nose: Nose normal.  Mouth/Throat: Oropharynx is clear and moist.  Eyes: Conjunctivae are normal. Pupils are equal, round, and reactive to light.  Neck: Normal range of motion. Neck supple. No JVD present.  Cardiovascular: Normal rate, regular rhythm, S1 normal, S2 normal, normal heart sounds and intact distal pulses.  Exam reveals no gallop and no friction rub.   No murmur heard. Pulmonary/Chest: Effort normal and breath sounds normal. No respiratory distress. She has no wheezes. She has no rales. She exhibits no tenderness.  Abdominal: Soft. Bowel sounds are normal. She  exhibits no distension. There is no tenderness.  Musculoskeletal: Normal range of motion. She exhibits no edema and no tenderness.  Lymphadenopathy:    She has no cervical adenopathy.  Neurological: She is alert and oriented to person, place, and time. Coordination normal.  Skin: Skin is warm and dry. No rash noted. No erythema.  Psychiatric: She has a normal mood and affect. Her behavior is normal. Judgment and thought  content normal.    Assessment and Plan

## 2013-08-07 NOTE — Assessment & Plan Note (Signed)
We have encouraged her to continue to work on weaning her cigarettes and smoking cessation. She will continue to work on this and does not want any assistance with chantix.  

## 2013-08-07 NOTE — Assessment & Plan Note (Signed)
We have recommended she start Lipitor 10 mg daily, continue fenofibrate. She is high risk of CAD given her continued smoking and long history of hyperlipidemia

## 2013-08-07 NOTE — Patient Instructions (Addendum)
You are doing well. Rhythm is normal  Please start atorvastatin 10 mg daily Continue fenofibrate one a day  look at the Price of pradaxa or xarelto with her pharmacist. Eliquis can be expensive  Please call us if you have new issues that need to be addressed before your next appt.  Your physician wants you to follow-up in: 6 months.  You will receive a reminder letter in the mail two months in advance. If you don't receive a letter, please call our office to schedule the follow-up appointment.

## 2013-08-07 NOTE — Assessment & Plan Note (Signed)
Blood pressure is well controlled on today's visit. No changes made to the medications. 

## 2013-08-07 NOTE — Assessment & Plan Note (Signed)
Managed by primary care. Chronic issue

## 2013-08-10 ENCOUNTER — Other Ambulatory Visit: Payer: Self-pay | Admitting: Family Medicine

## 2013-08-10 ENCOUNTER — Telehealth: Payer: Self-pay

## 2013-08-10 DIAGNOSIS — E785 Hyperlipidemia, unspecified: Secondary | ICD-10-CM

## 2013-08-10 NOTE — Telephone Encounter (Signed)
Pt daughter called and states she has pt in a program where the Eliquis is not costing her a lot, and at the time pt was unaware she had this assistance, and does not need to be switched. Would like to stay on Eliquis.

## 2013-08-10 NOTE — Telephone Encounter (Signed)
Spoke w/ pt.  She would like to make sure that her records indicate that she will continue taking Eliquis.

## 2013-08-11 ENCOUNTER — Other Ambulatory Visit: Payer: Medicare Other

## 2013-08-11 DIAGNOSIS — E785 Hyperlipidemia, unspecified: Secondary | ICD-10-CM

## 2013-08-11 LAB — LIPID PANEL
CHOL/HDL RATIO: 4 ratio
Cholesterol: 214 mg/dL — ABNORMAL HIGH (ref 0–200)
HDL: 53 mg/dL (ref >39)
LDL Cholesterol: 90 mg/dL (ref 0–99)
TRIGLYCERIDES: 356 mg/dL — AB (ref <150)
VLDL: 71 mg/dL — AB (ref 0–40)

## 2013-08-18 ENCOUNTER — Ambulatory Visit: Payer: Medicare Other | Admitting: Family Medicine

## 2013-08-18 ENCOUNTER — Other Ambulatory Visit: Payer: Self-pay | Admitting: Family Medicine

## 2013-08-20 ENCOUNTER — Telehealth: Payer: Self-pay | Admitting: *Deleted

## 2013-08-20 NOTE — Telephone Encounter (Signed)
Patient's daughter called and having a reaction to the new cholestrol medicine w/ diarreah. It started about two weeks ago

## 2013-08-20 NOTE — Telephone Encounter (Signed)
Left message for Catherine Orozco to call back.

## 2013-08-20 NOTE — Telephone Encounter (Signed)
Spoke w/ pt's daughter, Catherine Orozco.  She reports that pt has been experiencing diarrhea since starting atorvastatin. She has taken kaopectate w/ no relief. Advised her that this is a side effect of her medication and not to take it until hearing back from our office.  Pt is there w/ her and states that she does not want to take any cholesterol medication. Reviewed lipid results w/ her and discussed the need for medication, as well as diet and exercise. She is agreeable to this and is willing to try another type of med if will not cause diarrhea. Please advise.  Thank you.

## 2013-08-21 NOTE — Telephone Encounter (Signed)
Not a usual reaction to the cholesterol medication that perhaps it is happening for her Would call us back when diarrhea has resolved We will try an alternate medication

## 2013-08-24 MED ORDER — SIMVASTATIN 20 MG PO TABS: 20.0000 mg | ORAL_TABLET | Freq: Every day | ORAL | Status: DC

## 2013-08-24 NOTE — Telephone Encounter (Signed)
Would start the simvastatin 20 mg daily

## 2013-08-24 NOTE — Telephone Encounter (Signed)
Spoke w/ [name].  Pt reports that diarrhea has resolved since holding atorvastatin.  She would like to switch to a different medication.  Please advise.  Thank you.

## 2013-08-24 NOTE — Telephone Encounter (Signed)
Spoke w/ [name].  She is agreeable to Dr. Donivan Scull recommendation and will call w/ further questions or concerns.

## 2013-09-04 ENCOUNTER — Telehealth: Payer: Self-pay | Admitting: Family Medicine

## 2013-09-04 MED ORDER — DIPHENOXYLATE-ATROPINE 2.5-0.025 MG PO TABS: 2.0000 | ORAL_TABLET | Freq: Four times a day (QID) | ORAL | Status: DC | PRN

## 2013-09-04 NOTE — Telephone Encounter (Signed)
RX to pharmacy and pt aware

## 2013-09-04 NOTE — Telephone Encounter (Signed)
Lomotil 2 tabs po q 6 hrs prn diarrhea. NTBS if no better by monday

## 2013-09-04 NOTE — Telephone Encounter (Signed)
Message copied by Alyson Locket on Fri Sep 04, 2013 12:49 PM ------      Message from: Lenore Manner      Created: Fri Sep 04, 2013 12:35 PM      Regarding: diarrhea      Contact: [phone]       Can Dr Dennard Schaumann write a prescription for diarrhea she has been taking over counter medication and not getting better ------

## 2013-09-10 ENCOUNTER — Other Ambulatory Visit: Payer: Self-pay | Admitting: Family Medicine

## 2013-09-10 NOTE — Telephone Encounter (Signed)
Refill appropriate and filled per protocol. 

## 2013-10-12 ENCOUNTER — Other Ambulatory Visit: Payer: Self-pay | Admitting: Family Medicine

## 2013-10-12 NOTE — Telephone Encounter (Signed)
Rx called in 

## 2013-10-12 NOTE — Telephone Encounter (Signed)
?   OK to Refill  

## 2013-10-12 NOTE — Telephone Encounter (Signed)
ok 

## 2013-10-28 ENCOUNTER — Telehealth: Payer: Self-pay | Admitting: Family Medicine

## 2013-10-28 MED ORDER — HYDROCODONE-ACETAMINOPHEN 5-325 MG PO TABS: ORAL_TABLET | ORAL | Status: DC

## 2013-10-28 NOTE — Telephone Encounter (Signed)
Pt is needing a refill on HYDROcodone-acetaminophen (NORCO/VICODIN) 5-325 MG per tablet   Call back number is [phone]

## 2013-10-28 NOTE — Telephone Encounter (Signed)
?   OK to Refill  

## 2013-10-28 NOTE — Telephone Encounter (Signed)
Approved.  

## 2013-10-28 NOTE — Telephone Encounter (Signed)
RX printed x 3 months, left up front and patient aware to pick up  

## 2013-11-03 ENCOUNTER — Other Ambulatory Visit: Payer: Self-pay | Admitting: Family Medicine

## 2013-11-09 ENCOUNTER — Encounter: Payer: Self-pay | Admitting: Physician Assistant

## 2013-11-09 ENCOUNTER — Ambulatory Visit (INDEPENDENT_AMBULATORY_CARE_PROVIDER_SITE_OTHER): Payer: Medicare Other | Admitting: Physician Assistant

## 2013-11-09 VITALS — BP 122/70 | HR 64 | Temp 98.2°F | Resp 20 | Wt 185.0 lb

## 2013-11-09 DIAGNOSIS — R109 Unspecified abdominal pain: Secondary | ICD-10-CM

## 2013-11-09 DIAGNOSIS — R197 Diarrhea, unspecified: Secondary | ICD-10-CM

## 2013-11-09 NOTE — Progress Notes (Signed)
Patient ID: Catherine Orozco MRN: [id number], DOB: November 13, 1940, 73 y.o. Date of Encounter: @DATE @  Chief Complaint:  Chief Complaint  Patient presents with  . diarrhea x 1 month    can't eat  no appetite, abd pain, weight gain    HPI: 73 y.o. year old white female  presents with above symptoms.   Says that she's been having diffuse abdominal pain for one month now. When I asked her where her pain is, she points all across her entire abdomen.  Says that she has been having diarrhea 2-3 episodes every morning as well. Says that this only happens during the morning and then no further episodes later in the day.  Says that she has had no appetite for the past month. It is now about 4:00 PM. She says that today she has had a piece of toast this morning and ate half of a sandwich-for lunch. Is all she has had today. Says for the past month she has barely been eating. Says that her daughter lives with her. Asked if her daughter had noticed and she her response is "Yeaahm my daughter's noticed". (???) Asked if the pain got better or worse with eating and she says it really doesn't change-- does not matter whether she has eaten or not. Says it hurts all the time regardless.  States that she has had no vomiting. Has seen no melena or hematochezia.  Has never had a colonoscopy and has never seen a GI doctor per her report.    Past Medical History  Diagnosis Date  . Anxiety   . Depression   . 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 Hypertension   . Insomnia   . Peripheral neuropathy     no lumbar radiculopathy  . A-fib   . Arrhythmia      Home Meds: Outpatient Prescriptions Prior to Visit  Medication Sig Dispense Refill  . amLODipine (NORVASC) 5 MG tablet Take 1 tablet by mouth  daily  90 tablet  1  . apixaban (ELIQUIS) 5 MG TABS tablet Take 1 tablet (5 mg total) by mouth 2 (two) times daily.  180 tablet  4  . diazepam (VALIUM) 5 MG tablet TAKE 1 TABLET BY MOUTH TWICE A DAY AS  NEEDED-MUST LAST 30 DAYS  60 tablet  2  . fenofibrate (TRICOR) 145 MG tablet Take 1 tablet (145 mg total) by mouth daily.  90 tablet  6  . gabapentin (NEURONTIN) 600 MG tablet Take 1 tablet by mouth two  times daily  180 tablet  1  . hydrochlorothiazide (HYDRODIURIL) 25 MG tablet Take 1 tablet by mouth  daily  90 tablet  1  . HYDROcodone-acetaminophen (NORCO/VICODIN) 5-325 MG per tablet TAKE 1 TABLET BY MOUTH TWICE A DAY AS NEEDED  60 tablet  0  . hydrOXYzine (ATARAX/VISTARIL) 25 MG tablet TAKE 1 TABLET BY MOUTH AT BEDTIME (DISCONTINUE TRAZODONE)  30 tablet  3  . metoprolol tartrate (LOPRESSOR) 25 MG tablet Take 1 tablet (25 mg total) by mouth 2 (two) times daily.  180 tablet  3  . pyridOXINE (VITAMIN B-6) 100 MG tablet Take 100 mg by mouth daily.      . simvastatin (ZOCOR) 20 MG tablet Take 1 tablet (20 mg total) by mouth at bedtime.  90 tablet  3  . vitamin E 400 UNIT capsule Take 400 Units by mouth daily.      . diphenoxylate-atropine (LOMOTIL) 2.5-0.025 MG per tablet Take 2 tablets by  mouth every 6 (six) hours as needed for diarrhea or loose stools.  30 tablet  0   No facility-administered medications prior to visit.    Allergies:  Allergies  Allergen Reactions  . Paxil [Paroxetine Hcl]     Can't sleep    History   Social History  . Marital Status: Widowed    Spouse Name: N/A    Number of Children: N/A  . Years of Education: N/A   Occupational History  . Not on file.   Social History Main Topics  . Smoking status: Current Every Day Smoker -- 1.00 packs/day for 55 years  . Smokeless tobacco: Current User  . Alcohol Use: No  . Drug Use: No  . Sexual Activity: Not on file   Other Topics Concern  . Not on file   Social History Narrative  . No narrative on file    No family history on file.   Review of Systems:  See HPI for pertinent ROS. All other ROS negative.    Physical Exam: Blood pressure 122/70, pulse 64, temperature 98.2 F (36.8 C), temperature source  Oral, resp. rate 20, weight 185 lb (83.915 kg)., Body mass index is 30.79 kg/(m^2). General: Obese WF. Appears in no acute distress. Neck: Supple. No thyromegaly. No lymphadenopathy. Lungs: Clear bilaterally to auscultation without wheezes, rales, or rhonchi. Breathing is unlabored. Heart: RRR with S1 S2. No murmurs, rubs, or gallops. Abdomen:  normoactive bowel sounds. No hepatomegaly. Abdomen is protuberant and has areas of firmness. She has multiple areas that she reports are tender with palpation--mostly at periumbilical and left abdomen area.  No increased tenderness/pain with palpation of right upper quadrant or right lower quadrant (no suggestion of cholecystitis or appendicitis on exam) Musculoskeletal:  Strength and tone normal for age. Extremities/Skin: Warm and dry.  Neuro: Alert and oriented X 3. Moves all extremities spontaneously. Gait is normal. CNII-XII grossly in tact. Psych:  Responds to questions appropriately with a normal affect.     ASSESSMENT AND PLAN:  73 y.o. year old female with  1. Abdominal pain, unspecified site - CBC with Differential - COMPLETE METABOLIC PANEL WITH GFR - TSH - Clostridium difficile EIA - Ova and parasite examination - Stool culture; Future - Celiac panel 10 - CT Abdomen Pelvis W Contrast; Future  2. Diarrhea - CBC with Differential - COMPLETE METABOLIC PANEL WITH GFR - TSH - Clostridium difficile EIA - Ova and parasite examination - Stool culture; Future - Celiac panel 10 - CT Abdomen Pelvis W Contrast; Future  Will obtain the above labs and also will obtain CT scan. Will followup with patient is seen as I get these results. If symptoms worsen significantly in the interim,  she needs to go to the ER.  Marin Olp [place], [place], [facility] 11/09/2013 5:19 PM

## 2013-11-10 LAB — COMPLETE METABOLIC PANEL WITH GFR
ALT: 28 U/L (ref 0–35)
AST: 40 U/L — ABNORMAL HIGH (ref 0–37)
Albumin: 4.2 g/dL (ref 3.5–5.2)
Alkaline Phosphatase: 114 U/L (ref 39–117)
BILIRUBIN TOTAL: 0.6 mg/dL (ref 0.2–1.2)
BUN: 11 mg/dL (ref 6–23)
CO2: 27 mEq/L (ref 19–32)
Calcium: 9.6 mg/dL (ref 8.4–10.5)
Chloride: 100 mEq/L (ref 96–112)
Creat: 0.93 mg/dL (ref 0.50–1.10)
GFR, EST NON AFRICAN AMERICAN: 62 mL/min
GFR, Est African American: 72 mL/min
GLUCOSE: 112 mg/dL — AB (ref 70–99)
Potassium: 3.4 mEq/L — ABNORMAL LOW (ref 3.5–5.3)
SODIUM: 142 meq/L (ref 135–145)
TOTAL PROTEIN: 6.7 g/dL (ref 6.0–8.3)

## 2013-11-10 LAB — CELIAC PANEL 10
Endomysial Screen: NEGATIVE
Gliadin IgA: 5.4 U/mL (ref <20)
Gliadin IgG: 16.3 U/mL (ref <20)
IgA: 101 mg/dL (ref 69–380)
TISSUE TRANSGLUT AB: 4.3 U/mL (ref <20)
Tissue Transglutaminase Ab, IgA: 2.3 U/mL (ref <20)

## 2013-11-10 LAB — CBC WITH DIFFERENTIAL/PLATELET
BASOS PCT: 0 % (ref 0–1)
Basophils Absolute: 0 10*3/uL (ref 0.0–0.1)
Eosinophils Absolute: 0.2 10*3/uL (ref 0.0–0.7)
Eosinophils Relative: 2 % (ref 0–5)
HCT: 42.8 % (ref 36.0–46.0)
Hemoglobin: 15.2 g/dL — ABNORMAL HIGH (ref 12.0–15.0)
Lymphocytes Relative: 36 % (ref 12–46)
Lymphs Abs: 3.7 10*3/uL (ref 0.7–4.0)
MCH: 30.5 pg (ref 26.0–34.0)
MCHC: 35.5 g/dL (ref 30.0–36.0)
MCV: 85.9 fL (ref 78.0–100.0)
Monocytes Absolute: 0.9 10*3/uL (ref 0.1–1.0)
Monocytes Relative: 9 % (ref 3–12)
NEUTROS PCT: 53 % (ref 43–77)
Neutro Abs: 5.4 10*3/uL (ref 1.7–7.7)
PLATELETS: 252 10*3/uL (ref 150–400)
RBC: 4.98 MIL/uL (ref 3.87–5.11)
RDW: 14.7 % (ref 11.5–15.5)
WBC: 10.2 10*3/uL (ref 4.0–10.5)

## 2013-11-10 LAB — TSH: TSH: 2.706 u[IU]/mL (ref 0.350–4.500)

## 2013-11-11 ENCOUNTER — Other Ambulatory Visit: Payer: Medicare Other

## 2013-11-12 ENCOUNTER — Ambulatory Visit: Payer: Self-pay | Admitting: Family Medicine

## 2013-11-12 LAB — OVA AND PARASITE EXAMINATION: OP: NONE SEEN

## 2013-11-16 ENCOUNTER — Other Ambulatory Visit: Payer: Self-pay | Admitting: *Deleted

## 2013-11-16 DIAGNOSIS — R932 Abnormal findings on diagnostic imaging of liver and biliary tract: Secondary | ICD-10-CM

## 2013-11-16 MED ORDER — FLUCONAZOLE 150 MG PO TABS: 150.0000 mg | ORAL_TABLET | ORAL | Status: DC

## 2013-11-18 ENCOUNTER — Ambulatory Visit: Payer: Self-pay | Admitting: Family Medicine

## 2013-11-18 ENCOUNTER — Other Ambulatory Visit: Payer: Self-pay | Admitting: Family Medicine

## 2013-11-23 ENCOUNTER — Other Ambulatory Visit: Payer: Self-pay | Admitting: *Deleted

## 2013-11-23 DIAGNOSIS — K8000 Calculus of gallbladder with acute cholecystitis without obstruction: Secondary | ICD-10-CM

## 2013-12-02 ENCOUNTER — Encounter: Payer: Self-pay | Admitting: Family Medicine

## 2013-12-09 ENCOUNTER — Encounter: Payer: Self-pay | Admitting: Family Medicine

## 2013-12-11 ENCOUNTER — Encounter (INDEPENDENT_AMBULATORY_CARE_PROVIDER_SITE_OTHER): Payer: Self-pay | Admitting: Surgery

## 2013-12-11 ENCOUNTER — Encounter (INDEPENDENT_AMBULATORY_CARE_PROVIDER_SITE_OTHER): Payer: Self-pay | Admitting: General Surgery

## 2013-12-11 ENCOUNTER — Ambulatory Visit (INDEPENDENT_AMBULATORY_CARE_PROVIDER_SITE_OTHER): Payer: Medicare Other | Admitting: Surgery

## 2013-12-11 VITALS — BP 126/72 | HR 71 | Temp 97.2°F | Ht 65.0 in | Wt 186.0 lb

## 2013-12-11 DIAGNOSIS — K8020 Calculus of gallbladder without cholecystitis without obstruction: Secondary | ICD-10-CM

## 2013-12-11 HISTORY — DX: Calculus of gallbladder without cholecystitis without obstruction: K80.20

## 2013-12-11 NOTE — Progress Notes (Signed)
Patient ID: Catherine Orozco, female   DOB: 1941-05-06, 73 y.o.   MRN: [id number]  Chief Complaint  Patient presents with  . Abdominal Pain    cholelithiasis    HPI Catherine Orozco is a 73 y.o. female.    HPI This is a lady who is referred to me by Dr. Dennard Schaumann for possible symptomatic cholelithiasis. She has been having abdominal pain for over 2 years which she describes as a diffuse abdominal pain. She is now having diarrhea for 2-3 months. She is very nonspecific about her pain. Her appetite has been decreasing. Again, her pain is sharp, diffuse, and daily. It is intermittent.  She denies jaundice. There is no blood in her bowel movement. She had a CAT scan that showed possible cholecystitis. Her ultrasound confirms gallstones there is minimal evidence of cholecystitis. Past Medical History  Diagnosis Date  . Anxiety   . Depression   . 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 Hypertension   . Insomnia   . Peripheral neuropathy     no lumbar radiculopathy  . A-fib   . Arrhythmia     Past Surgical History  Procedure Laterality Date  . Abdominal hysterectomy      History reviewed. No pertinent family history.  Social History History  Substance Use Topics  . Smoking status: Current Every Day Smoker -- 1.00 packs/day for 55 years  . Smokeless tobacco: Current User  . Alcohol Use: No    Allergies  Allergen Reactions  . Paxil [Paroxetine Hcl]     Can't sleep    Current Outpatient Prescriptions  Medication Sig Dispense Refill  . amLODipine (NORVASC) 5 MG tablet Take 1 tablet by mouth  daily  90 tablet  1  . apixaban (ELIQUIS) 5 MG TABS tablet Take 1 tablet (5 mg total) by mouth 2 (two) times daily.  180 tablet  4  . diazepam (VALIUM) 5 MG tablet TAKE 1 TABLET BY MOUTH TWICE A DAY AS NEEDED-MUST LAST 30 DAYS  60 tablet  2  . diphenoxylate-atropine (LOMOTIL) 2.5-0.025 MG per tablet Take 2 tablets by mouth every 6 (six) hours as needed for diarrhea or loose stools.  30  tablet  0  . fenofibrate (TRICOR) 145 MG tablet Take 1 tablet (145 mg total) by mouth daily.  90 tablet  6  . fluconazole (DIFLUCAN) 150 MG tablet Take 1 tablet (150 mg total) by mouth every other day.  4 tablet  0  . gabapentin (NEURONTIN) 600 MG tablet Take 1 tablet by mouth two  times daily  180 tablet  1  . hydrochlorothiazide (HYDRODIURIL) 25 MG tablet Take 1 tablet by mouth  daily  90 tablet  1  . HYDROcodone-acetaminophen (NORCO/VICODIN) 5-325 MG per tablet TAKE 1 TABLET BY MOUTH TWICE A DAY AS NEEDED  60 tablet  0  . hydrOXYzine (ATARAX/VISTARIL) 25 MG tablet TAKE 1 TABLET BY MOUTH AT BEDTIME (DISCONTINUE TRAZODONE)  30 tablet  3  . metoprolol tartrate (LOPRESSOR) 25 MG tablet TAKE 1 TABLET BY MOUTH TWICE A DAY  180 tablet  3  . pyridOXINE (VITAMIN B-6) 100 MG tablet Take 100 mg by mouth daily.      . simvastatin (ZOCOR) 20 MG tablet Take 1 tablet (20 mg total) by mouth at bedtime.  90 tablet  3  . vitamin E 400 UNIT capsule Take 400 Units by mouth daily.       No current facility-administered medications for this visit.    Review  of Systems Review of Systems  Constitutional: Negative for fever, chills and unexpected weight change.  HENT: Negative for congestion, hearing loss, sore throat, trouble swallowing and voice change.   Eyes: Negative for visual disturbance.  Respiratory: Negative for cough and wheezing.   Cardiovascular: Positive for palpitations. Negative for chest pain and leg swelling.  Gastrointestinal: Positive for abdominal pain, diarrhea and abdominal distention. Negative for nausea, vomiting, constipation, blood in stool and anal bleeding.  Genitourinary: Negative for hematuria, vaginal bleeding and difficulty urinating.  Musculoskeletal: Positive for arthralgias and back pain.  Skin: Negative for rash and wound.  Neurological: Negative for seizures, syncope and headaches.  Hematological: Negative for adenopathy. Does not bruise/bleed easily.   Psychiatric/Behavioral: Negative for confusion.    Blood pressure 126/72, pulse 71, temperature 97.2 F (36.2 C), height 5\' 5"  (1.651 m), weight 186 lb (84.369 kg).  Physical Exam Physical Exam  Constitutional: She appears well-developed and well-nourished. No distress.  HENT:  Head: Normocephalic and atraumatic.  Right Ear: External ear normal.  Nose: Nose normal.  Mouth/Throat: Oropharynx is clear and moist. No oropharyngeal exudate.  Eyes: Conjunctivae are normal. Pupils are equal, round, and reactive to light.  Neck: Normal range of motion. Neck supple. No tracheal deviation present.  Cardiovascular: Normal rate and intact distal pulses.   No murmur heard. Irregular rhythm  Pulmonary/Chest: Effort normal and breath sounds normal. No respiratory distress. She has no wheezes.  Abdominal: Soft. Bowel sounds are normal. There is tenderness. There is guarding.  She has diffuse abdominal tenderness in all quadrants. One area does not seem anymore tender than any other area. Her abdomen is soft. There are no hernias  Musculoskeletal: Normal range of motion. She exhibits no edema and no tenderness.  Lymphadenopathy:    She has no cervical adenopathy.  Neurological: She is alert.  Psychiatric: Her behavior is normal. Judgment normal.    Data Reviewed Her most recent laboratory data showed a normal white count and mostly normal liver function tests. Her CAT scan suggested thickening of the gallbladder wall. Her O2 sat showed gallstones and maybe one small focal area of thickening of the gallbladder wall but the rest of the gallbladder wall is normal.  Assessment    Abdominal pain of uncertain etiology, cholelithiasis     Plan    She and her daughter are insistent that she undergo a laparoscopic cholecystectomy if they feel that this is the source of her discomfort. Certainly, based on ultrasound and CAT scan this may be causing some of her abdominal discomfort and tenderness. I do  believe it is reasonable to proceed with a laparoscopic cholecystectomy although one of her risk of surgery is this may not resolve all of her abdominal complaints. I discussed the other risks of surgery which includes but is not limited to bleeding, infection, injury to surrounding structures, need to convert to an open procedure, cardiopulmonary issues, DVT, etc. She will need to stop her chronic anticoagulation medications at least 5 days preoperatively and we will have to get cardiac clearance from her cardiologist for general anesthesia.        Quinnlyn Hearns A 12/11/2013, 10:31 AM

## 2013-12-15 ENCOUNTER — Other Ambulatory Visit (INDEPENDENT_AMBULATORY_CARE_PROVIDER_SITE_OTHER): Payer: Self-pay | Admitting: Surgery

## 2013-12-24 ENCOUNTER — Telehealth: Payer: Self-pay

## 2013-12-24 NOTE — Telephone Encounter (Signed)
Patient was switched from atorvastatin to simvastatin due to diarrhea, now is having diarrhea due to simvastatin. Please contact the patient's daughter [name]) for further information.

## 2013-12-24 NOTE — Telephone Encounter (Signed)
Spoke w/ pt's daughter.  She reports that pt has had diarrhea and bowel incontinence since switching to atorvastatin in March.  She has been to her PCP several times for this issue and is certain this is a side effect of atorvastatin, she states it is debilitating and embarrassing for pt and she does not want to take anymore. Also, pt was referred by her PCP to Dr. Ninfa Linden for gallbladder issues.   She reports that pt "really wants her gallbladder out", but would like another surgeon to perform procedure. She will ask PCP for a referral to someone local, but would like to know if Dr. Rockey Situ has a recommendation.  Please advise.  Thank you.

## 2013-12-24 NOTE — Telephone Encounter (Signed)
She can stop the statin for now and see if sx resolve If it clears up, would could try an alternate medication at a later date  Don't have a particular surgeon for gall bladder, Would defer to her primary care

## 2013-12-25 NOTE — Telephone Encounter (Signed)
Spoke w/ [name].  Advised her of Dr. Donivan Scull recommendation.  She is agreeable and will call back w/ any questions or concerns.

## 2013-12-28 ENCOUNTER — Telehealth: Payer: Self-pay | Admitting: Family Medicine

## 2013-12-28 NOTE — Telephone Encounter (Signed)
[phone]  PT takes HYDROcodone-acetaminophen (NORCO/VICODIN) 5-325 MG per tablet and the date im guessing that is on there to be filled is on the 20th and she will be out before then and they are wanting to know if they can get that changed

## 2013-12-29 ENCOUNTER — Encounter: Payer: Self-pay | Admitting: Family Medicine

## 2013-12-29 ENCOUNTER — Encounter: Payer: Self-pay | Admitting: *Deleted

## 2013-12-29 ENCOUNTER — Telehealth: Payer: Self-pay | Admitting: *Deleted

## 2013-12-29 NOTE — Telephone Encounter (Signed)
[phone] Pt is needing to know if her heart pills are here?

## 2013-12-29 NOTE — Telephone Encounter (Signed)
Samples of Eliquis 5 mg @ front desk for pick up.

## 2013-12-29 NOTE — Telephone Encounter (Signed)
This encounter was created in error - please disregard.

## 2013-12-29 NOTE — Telephone Encounter (Signed)
Date on RX is correct. The previous months were written for different day due to the due dates being on the weekend or a holiday. She should have enough to last her through til the 20th. If not then she is taking too many. Pt aware and agrees.

## 2013-12-29 NOTE — Telephone Encounter (Signed)
She needs samples of Eloquis. Please call when ready for pick up.

## 2013-12-30 NOTE — Telephone Encounter (Signed)
Pt daughter called, states  This encounter was created in error - please disregard.

## 2014-01-01 ENCOUNTER — Telehealth (INDEPENDENT_AMBULATORY_CARE_PROVIDER_SITE_OTHER): Payer: Self-pay

## 2014-01-01 ENCOUNTER — Ambulatory Visit (INDEPENDENT_AMBULATORY_CARE_PROVIDER_SITE_OTHER): Payer: Medicare Other | Admitting: Cardiovascular Disease

## 2014-01-01 ENCOUNTER — Encounter: Payer: Self-pay | Admitting: Cardiovascular Disease

## 2014-01-01 ENCOUNTER — Encounter (INDEPENDENT_AMBULATORY_CARE_PROVIDER_SITE_OTHER): Payer: Self-pay

## 2014-01-01 VITALS — BP 130/80 | HR 75 | Ht 65.0 in | Wt 186.3 lb

## 2014-01-01 DIAGNOSIS — I10 Essential (primary) hypertension: Secondary | ICD-10-CM

## 2014-01-01 DIAGNOSIS — R197 Diarrhea, unspecified: Secondary | ICD-10-CM | POA: Insufficient documentation

## 2014-01-01 DIAGNOSIS — F419 Anxiety disorder, unspecified: Secondary | ICD-10-CM

## 2014-01-01 DIAGNOSIS — K8020 Calculus of gallbladder without cholecystitis without obstruction: Secondary | ICD-10-CM

## 2014-01-01 DIAGNOSIS — E785 Hyperlipidemia, unspecified: Secondary | ICD-10-CM

## 2014-01-01 DIAGNOSIS — F17200 Nicotine dependence, unspecified, uncomplicated: Secondary | ICD-10-CM

## 2014-01-01 DIAGNOSIS — M549 Dorsalgia, unspecified: Secondary | ICD-10-CM

## 2014-01-01 DIAGNOSIS — I4891 Unspecified atrial fibrillation: Secondary | ICD-10-CM

## 2014-01-01 DIAGNOSIS — G8929 Other chronic pain: Secondary | ICD-10-CM

## 2014-01-01 DIAGNOSIS — F411 Generalized anxiety disorder: Secondary | ICD-10-CM

## 2014-01-01 NOTE — Patient Instructions (Signed)
You are doing well. No medication changes were made.  Please call us if you have new issues that need to be addressed before your next appt.  Your physician wants you to follow-up in: 6 months.  You will receive a reminder letter in the mail two months in advance. If you don't receive a letter, please call our office to schedule the follow-up appointment.   

## 2014-01-01 NOTE — Assessment & Plan Note (Signed)
We have encouraged her to continue to work on weaning her cigarettes and smoking cessation. She will continue to work on this and does not want any assistance with chantix.  

## 2014-01-01 NOTE — Progress Notes (Signed)
Patient ID: Catherine Orozco, female    DOB: July 16, 1940, 73 y.o.   MRN: [id number]  HPI Comments: Catherine Orozco is a very pleasant 73 year old woman with a history of smoking for 50 years, lower extremity neuropathy, DJD per the patient/chronic back pain,  strong family history of heart disease with father who passed away from MI at age 21, mother with stroke in her 23s, history of atrial fibrillation. She presents for routine followup  On today's visit, she reports that she hurts all over in her back, legs. She continues to have chronic diarrhea. She initially attributed this to her cholesterol medication that holding the medication did not help her symptoms. She takes Imodium sometimes. She states her memory is poor. She has difficulty remembering her medications or her doctors. She denies any tachycardia or shortness of breath. No chest pain symptoms. She is relatively sedentary, no regular exercise She continues to smoke one pack per day Significant back pain, neuropathy, leg pain. She takes pain medication She is tolerating anticoagulation and metoprolol twice a day  seen in his clinic in July 2014 with an EKG showing atrial fibrillation. She was asymptomatic.   EKG today shows normal sinus rhythm with rate 75 beats per minute, no significant ST or T wave changes, APCs in a bigeminal pattern    Outpatient Encounter Prescriptions as of 01/01/2014  Medication Sig  . amLODipine (NORVASC) 5 MG tablet Take 1 tablet by mouth  daily  . apixaban (ELIQUIS) 5 MG TABS tablet Take 1 tablet (5 mg total) by mouth 2 (two) times daily.  . diazepam (VALIUM) 5 MG tablet TAKE 1 TABLET BY MOUTH TWICE A DAY AS NEEDED-MUST LAST 30 DAYS  . diphenoxylate-atropine (LOMOTIL) 2.5-0.025 MG per tablet Take 2 tablets by mouth every 6 (six) hours as needed for diarrhea or loose stools.  . fenofibrate (TRICOR) 145 MG tablet Take 1 tablet (145 mg total) by mouth daily.  . fluconazole (DIFLUCAN) 150 MG tablet Take 1  tablet (150 mg total) by mouth every other day.  . gabapentin (NEURONTIN) 600 MG tablet Take 1 tablet by mouth two  times daily  . hydrochlorothiazide (HYDRODIURIL) 25 MG tablet Take 1 tablet by mouth  daily  . HYDROcodone-acetaminophen (NORCO/VICODIN) 5-325 MG per tablet TAKE 1 TABLET BY MOUTH TWICE A DAY AS NEEDED  . hydrOXYzine (ATARAX/VISTARIL) 25 MG tablet TAKE 1 TABLET BY MOUTH AT BEDTIME (DISCONTINUE TRAZODONE)  . metoprolol tartrate (LOPRESSOR) 25 MG tablet TAKE 1 TABLET BY MOUTH TWICE A DAY  . pyridOXINE (VITAMIN B-6) 100 MG tablet Take 100 mg by mouth daily.  . vitamin E 400 UNIT capsule Take 400 Units by mouth daily.    Review of Systems  Constitutional: Negative.   HENT: Negative.   Eyes: Negative.   Respiratory: Negative.   Cardiovascular: Negative.   Gastrointestinal: Negative.   Endocrine: Negative.   Musculoskeletal: Positive for arthralgias and back pain.  Skin: Negative.   Allergic/Immunologic: Negative.   Neurological: Negative.   Hematological: Negative.   Psychiatric/Behavioral: Negative.   All other systems reviewed and are negative.   BP 130/80  Ht 5\' 5"  (1.651 m)  Wt 186 lb 5 oz (84.511 kg)  BMI 31.00 kg/m2  Physical Exam  Nursing note and vitals reviewed. Constitutional: She is oriented to person, place, and time. She appears well-developed and well-nourished.  HENT:  Head: Normocephalic.  Nose: Nose normal.  Mouth/Throat: Oropharynx is clear and moist.  Eyes: Conjunctivae are normal. Pupils are equal, round, and reactive to light.  Neck: Normal range of motion. Neck supple. No JVD present.  Cardiovascular: Normal rate, regular rhythm, S1 normal, S2 normal, normal heart sounds and intact distal pulses.  Exam reveals no gallop and no friction rub.   No murmur heard. Pulmonary/Chest: Effort normal and breath sounds normal. No respiratory distress. She has no wheezes. She has no rales. She exhibits no tenderness.  Abdominal: Soft. Bowel sounds are  normal. She exhibits no distension. There is no tenderness.  Musculoskeletal: Normal range of motion. She exhibits no edema and no tenderness.  Lymphadenopathy:    She has no cervical adenopathy.  Neurological: She is alert and oriented to person, place, and time. Coordination normal.  Skin: Skin is warm and dry. No rash noted. No erythema.  Psychiatric: She has a normal mood and affect. Her behavior is normal. Judgment and thought content normal.    Assessment and Plan

## 2014-01-01 NOTE — Telephone Encounter (Signed)
LMOM for pt's daughter to call me. I want to make sure the pt know that she is to HOLD eliquis 2 days before sx and then to restart eliquis no earlier than 2 days after surgery per Dr Rockey Situ.

## 2014-01-01 NOTE — Assessment & Plan Note (Signed)
Encouraged her to stay on her cholesterol medication.

## 2014-01-01 NOTE — Assessment & Plan Note (Signed)
Blood pressure is well controlled on today's visit. No changes made to the medications. 

## 2014-01-01 NOTE — Assessment & Plan Note (Signed)
On chronic pain medication

## 2014-01-01 NOTE — Assessment & Plan Note (Signed)
Acceptable risk for upcoming gallbladder surgery. Suggested she stop her anticoagulation 3 days prior to surgery. She is maintaining normal sinus rhythm. This could be restarted when she is felt to be in appropriate anticoagulation candidate, would recommend at least 2 or 3 days post procedure

## 2014-01-01 NOTE — Assessment & Plan Note (Signed)
Maintaining normal sinus rhythm. Suggested she continue on anticoagulation and metoprolol

## 2014-01-01 NOTE — Assessment & Plan Note (Signed)
She has some anxiety, seems to have some depression. Also with memory issues. Daughter does much of her affairs. Patient does not remember her medications or her doctor's names

## 2014-01-01 NOTE — Assessment & Plan Note (Signed)
Etiology unclear. She hopes that cholecystectomy will improve her symptoms. She does not know who her surgeon is. She will call us next week. Workup through primary care. She reports that she does not see GI

## 2014-01-07 ENCOUNTER — Other Ambulatory Visit: Payer: Self-pay | Admitting: Family Medicine

## 2014-01-07 ENCOUNTER — Encounter (HOSPITAL_COMMUNITY): Payer: Self-pay | Admitting: Pharmacy Technician

## 2014-01-08 ENCOUNTER — Other Ambulatory Visit (INDEPENDENT_AMBULATORY_CARE_PROVIDER_SITE_OTHER): Payer: Self-pay | Admitting: Surgery

## 2014-01-08 ENCOUNTER — Encounter (HOSPITAL_COMMUNITY): Payer: Self-pay

## 2014-01-08 ENCOUNTER — Encounter (HOSPITAL_COMMUNITY)
Admission: RE | Admit: 2014-01-08 | Discharge: 2014-01-08 | Disposition: A | Discharge status: Home / Self Care | Payer: Medicare Other | Source: Ambulatory Visit | Attending: Surgery | Admitting: Surgery

## 2014-01-08 ENCOUNTER — Encounter (HOSPITAL_COMMUNITY)
Admission: RE | Admit: 2014-01-08 | Discharge: 2014-01-08 | Disposition: A | Discharge status: Home / Self Care | Payer: Medicare Other | Source: Ambulatory Visit | Attending: Anesthesiology | Admitting: Anesthesiology

## 2014-01-08 ENCOUNTER — Telehealth (INDEPENDENT_AMBULATORY_CARE_PROVIDER_SITE_OTHER): Payer: Self-pay

## 2014-01-08 DIAGNOSIS — R911 Solitary pulmonary nodule: Secondary | ICD-10-CM

## 2014-01-08 DIAGNOSIS — Z01818 Encounter for other preprocedural examination: Secondary | ICD-10-CM | POA: Insufficient documentation

## 2014-01-08 DIAGNOSIS — K8020 Calculus of gallbladder without cholecystitis without obstruction: Secondary | ICD-10-CM | POA: Insufficient documentation

## 2014-01-08 HISTORY — DX: Unspecified osteoarthritis, unspecified site: M19.90

## 2014-01-08 LAB — BASIC METABOLIC PANEL
Anion gap: 16 — ABNORMAL HIGH (ref 5–15)
BUN: 11 mg/dL (ref 6–23)
CHLORIDE: 96 meq/L (ref 96–112)
CO2: 24 mEq/L (ref 19–32)
CREATININE: 0.88 mg/dL (ref 0.50–1.10)
Calcium: 9.6 mg/dL (ref 8.4–10.5)
GFR calc non Af Amer: 65 mL/min — ABNORMAL LOW (ref >90)
GFR, EST AFRICAN AMERICAN: 75 mL/min — AB (ref >90)
Glucose, Bld: 114 mg/dL — ABNORMAL HIGH (ref 70–99)
POTASSIUM: 3.5 meq/L — AB (ref 3.7–5.3)
SODIUM: 136 meq/L — AB (ref 137–147)

## 2014-01-08 LAB — CBC
HCT: 44.3 % (ref 36.0–46.0)
Hemoglobin: 15.7 g/dL — ABNORMAL HIGH (ref 12.0–15.0)
MCH: 31.3 pg (ref 26.0–34.0)
MCHC: 35.4 g/dL (ref 30.0–36.0)
MCV: 88.4 fL (ref 78.0–100.0)
Platelets: 219 10*3/uL (ref 150–400)
RBC: 5.01 MIL/uL (ref 3.87–5.11)
RDW: 13.7 % (ref 11.5–15.5)
WBC: 11 10*3/uL — ABNORMAL HIGH (ref 4.0–10.5)

## 2014-01-08 NOTE — Progress Notes (Signed)
Primary - dr. Dewayne Shorter [facility] Cardiologist - dr. Mercy Moore in epic 12/2013 Heart clearance on chart

## 2014-01-08 NOTE — Progress Notes (Signed)
I have just received a report of the patient's preoperative chest x-ray showing a right lung nodule. CAT scan of the chest is then recommended without contrast to rule out a malignancy. I have ordered this and left a phone message with the patient's cell phone.

## 2014-01-08 NOTE — Telephone Encounter (Signed)
Called pt to notify her that preop cxr came back showing that she needs to get a Chest CT to eval.nodular density. Per Dr Ninfa Linden to get CT of Chest on Monday before her surgery on Tuesday. I advised pt that she is scheduled for CT chest on 8/31 at 9:10 [facility].

## 2014-01-09 ENCOUNTER — Other Ambulatory Visit: Payer: Self-pay | Admitting: Family Medicine

## 2014-01-09 NOTE — Telephone Encounter (Signed)
Refill appropriate and filled per protocol. 

## 2014-01-11 ENCOUNTER — Ambulatory Visit
Admission: RE | Admit: 2014-01-11 | Discharge: 2014-01-11 | Disposition: A | Discharge status: Home / Self Care | Payer: Medicare Other | Source: Ambulatory Visit | Attending: Surgery | Admitting: Surgery

## 2014-01-11 DIAGNOSIS — R911 Solitary pulmonary nodule: Secondary | ICD-10-CM

## 2014-01-11 MED ORDER — CEFAZOLIN SODIUM-DEXTROSE 2-3 GM-% IV SOLR
2.0000 g | INTRAVENOUS | Status: AC
  Administered 2014-01-12: 2 g via INTRAVENOUS
  Filled 2014-01-11: qty 50

## 2014-01-11 NOTE — Telephone Encounter (Signed)
This encounter was created in error - please disregard.

## 2014-01-11 NOTE — Telephone Encounter (Signed)
Called pt's daughter to give the good news the CT of chest is normal per Dr Ninfa Linden. The daughter will call pt to give her good news.

## 2014-01-11 NOTE — Progress Notes (Signed)
Anesthesia Chart Review:  Patient is a 73 year old female scheduled for laparoscopic cholecystectomy on 01/12/14 by Dr. Coralie Keens.  History includes smoking, afib, HTN, anxiety, depression, peripheral neuropathy, insomnia, arthritis, hysterectomy. BMI is consistent with mild obesity.  PCP is Dr. Dennard Schaumann.  Cardiologist is Dr. Rockey Situ who felt she was acceptable risk for surgery.  He recommended holding Eliquis for three days prior to surgery.  EKG on 01/01/14 showed SR, frequent PAC's, non-specific ST depression. HR was 56 bpm at PAT.  Echo on 01/23/13 showed:  - Left ventricle: The cavity size was normal. Systolic function was normal. The estimated ejection fraction was in the range of 60% to 65%. Wall motion was normal; there were no regional wall motion abnormalities. Left ventricular diastolic function parameters were normal. - Right ventricle: Systolic function was normal. - Pulmonary arteries: Systolic pressure was within the normal range. Impressions: Normal study.  CXR on 01/08/14 showed: 1. Nodular density in the medial aspect of the lower right hemi thorax. CT chest without contrast is recommended in further evaluation, especially in this patient with a significant smoking history. These results will be called to the ordering clinician or representative by the Radiologist Assistant, and communication documented in the PACS or zVision Dashboard. 2. Probable scarring in the lingula.  Subsequently, Dr. Ninfa Linden ordered a chest CT which was done on 01/11/2014 and showed: 1. No evidence of pulmonary nodule.  2. No acute chest findings.  3. Mild pulmonary scarring and atelectasis are noted.  4. Mild atherosclerosis.  5. Prominent heterogeneous hepatic steatosis.   Preoperative labs noted. AST minimally elevated at 40 on 11/09/13. AST, ALK phone, total Bilirubin WNL 11/09/13. She is for PT/PTT on the day of surgery.  If no acute changes then I would anticipate that she could proceed as  planned.    George Hugh [facility] Stay [facility]/Anesthesiology Phone [phone] 01/11/2014 10:41 AM

## 2014-01-11 NOTE — H&P (Signed)
Chief Complaint   Patient presents with   .  Abdominal Pain     cholelithiasis   HPI  Catherine Orozco is a 73 y.o. female.  HPI  This is a lady who is referred to me by Dr. Dennard Schaumann for possible symptomatic cholelithiasis. She has been having abdominal pain for over 2 years which she describes as a diffuse abdominal pain. She is now having diarrhea for 2-3 months. She is very nonspecific about her pain. Her appetite has been decreasing. Again, her pain is sharp, diffuse, and daily. It is intermittent. She denies jaundice. There is no blood in her bowel movement. She had a CAT scan that showed possible cholecystitis. Her ultrasound confirms gallstones there is minimal evidence of cholecystitis.  Past Medical History   Diagnosis  Date   .  Anxiety    .  Depression    .  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  Hypertension    .  Insomnia    .  Peripheral neuropathy      no lumbar radiculopathy   .  A-fib    .  Arrhythmia     Past Surgical History   Procedure  Laterality  Date   .  Abdominal hysterectomy     History reviewed. No pertinent family history.  Social History  History   Substance Use Topics   .  Smoking status:  Current Every Day Smoker -- 1.00 packs/day for 55 years   .  Smokeless tobacco:  Current User   .  Alcohol Use:  No    Allergies   Allergen  Reactions   .  Paxil [Paroxetine Hcl]      Can't sleep    Current Outpatient Prescriptions   Medication  Sig  Dispense  Refill   .  amLODipine (NORVASC) 5 MG tablet  Take 1 tablet by mouth daily  90 tablet  1   .  apixaban (ELIQUIS) 5 MG TABS tablet  Take 1 tablet (5 mg total) by mouth 2 (two) times daily.  180 tablet  4   .  diazepam (VALIUM) 5 MG tablet  TAKE 1 TABLET BY MOUTH TWICE A DAY AS NEEDED-MUST LAST 30 DAYS  60 tablet  2   .  diphenoxylate-atropine (LOMOTIL) 2.5-0.025 MG per tablet  Take 2 tablets by mouth every 6 (six) hours as needed for diarrhea or loose stools.  30 tablet  0   .  fenofibrate (TRICOR)  145 MG tablet  Take 1 tablet (145 mg total) by mouth daily.  90 tablet  6   .  fluconazole (DIFLUCAN) 150 MG tablet  Take 1 tablet (150 mg total) by mouth every other day.  4 tablet  0   .  gabapentin (NEURONTIN) 600 MG tablet  Take 1 tablet by mouth two times daily  180 tablet  1   .  hydrochlorothiazide (HYDRODIURIL) 25 MG tablet  Take 1 tablet by mouth daily  90 tablet  1   .  HYDROcodone-acetaminophen (NORCO/VICODIN) 5-325 MG per tablet  TAKE 1 TABLET BY MOUTH TWICE A DAY AS NEEDED  60 tablet  0   .  hydrOXYzine (ATARAX/VISTARIL) 25 MG tablet  TAKE 1 TABLET BY MOUTH AT BEDTIME (DISCONTINUE TRAZODONE)  30 tablet  3   .  metoprolol tartrate (LOPRESSOR) 25 MG tablet  TAKE 1 TABLET BY MOUTH TWICE A DAY  180 tablet  3   .  pyridOXINE (VITAMIN B-6) 100 MG tablet  Take 100 mg by mouth daily.     .  simvastatin (ZOCOR) 20 MG tablet  Take 1 tablet (20 mg total) by mouth at bedtime.  90 tablet  3   .  vitamin E 400 UNIT capsule  Take 400 Units by mouth daily.      No current facility-administered medications for this visit.   Review of Systems  Review of Systems  Constitutional: Negative for fever, chills and unexpected weight change.  HENT: Negative for congestion, hearing loss, sore throat, trouble swallowing and voice change.  Eyes: Negative for visual disturbance.  Respiratory: Negative for cough and wheezing.  Cardiovascular: Positive for palpitations. Negative for chest pain and leg swelling.  Gastrointestinal: Positive for abdominal pain, diarrhea and abdominal distention. Negative for nausea, vomiting, constipation, blood in stool and anal bleeding.  Genitourinary: Negative for hematuria, vaginal bleeding and difficulty urinating.  Musculoskeletal: Positive for arthralgias and back pain.  Skin: Negative for rash and wound.  Neurological: Negative for seizures, syncope and headaches.  Hematological: Negative for adenopathy. Does not bruise/bleed easily.  Psychiatric/Behavioral: Negative for  confusion.  Blood pressure 126/72, pulse 71, temperature 97.2 F (36.2 C), height 5\' 5"  (1.651 m), weight 186 lb (84.369 kg).  Physical Exam  Physical Exam  Constitutional: She appears well-developed and well-nourished. No distress.  HENT:  Head: Normocephalic and atraumatic.  Right Ear: External ear normal.  Nose: Nose normal.  Mouth/Throat: Oropharynx is clear and moist. No oropharyngeal exudate.  Eyes: Conjunctivae are normal. Pupils are equal, round, and reactive to light.  Neck: Normal range of motion. Neck supple. No tracheal deviation present.  Cardiovascular: Normal rate and intact distal pulses.  No murmur heard. Irregular rhythm  Pulmonary/Chest: Effort normal and breath sounds normal. No respiratory distress. She has no wheezes.  Abdominal: Soft. Bowel sounds are normal. There is tenderness. There is guarding.  She has diffuse abdominal tenderness in all quadrants. One area does not seem anymore tender than any other area. Her abdomen is soft. There are no hernias  Musculoskeletal: Normal range of motion. She exhibits no edema and no tenderness.  Lymphadenopathy:  She has no cervical adenopathy.  Neurological: She is alert.  Psychiatric: Her behavior is normal. Judgment normal.  Data Reviewed  Her most recent laboratory data showed a normal white count and mostly normal liver function tests. Her CAT scan suggested thickening of the gallbladder wall. Her O2 sat showed gallstones and maybe one small focal area of thickening of the gallbladder wall but the rest of the gallbladder wall is normal.  Assessment  Abdominal pain of uncertain etiology, cholelithiasis  Plan  She and her daughter are insistent that she undergo a laparoscopic cholecystectomy if they feel that this is the source of her discomfort. Certainly, based on ultrasound and CAT scan this may be causing some of her abdominal discomfort and tenderness. I do believe it is reasonable to proceed with a laparoscopic  cholecystectomy although one of her risk of surgery is this may not resolve all of her abdominal complaints. I discussed the other risks of surgery which includes but is not limited to bleeding, infection, injury to surrounding structures, need to convert to an open procedure, cardiopulmonary issues, DVT, etc. She will need to stop her chronic anticoagulation medications at least 5 days preoperatively and we will have to get cardiac clearance from her cardiologist for general anesthesia.   Addendum: Her preoperative chest x-ray suggested a right pulmonary nodule. CAT scan of this shows no evidence of nodule or any  findings suggestive of malignancy.

## 2014-01-12 ENCOUNTER — Ambulatory Visit (HOSPITAL_COMMUNITY): Payer: Medicare Other | Admitting: Anesthesiology

## 2014-01-12 ENCOUNTER — Encounter (HOSPITAL_COMMUNITY)
Admission: RE | Disposition: A | Discharge status: Home / Self Care | Payer: Self-pay | Source: Ambulatory Visit | Attending: Surgery

## 2014-01-12 ENCOUNTER — Observation Stay (HOSPITAL_COMMUNITY)
Admission: RE | Admit: 2014-01-12 | Discharge: 2014-01-12 | Disposition: A | Discharge status: Home / Self Care | Payer: Medicare Other | Source: Ambulatory Visit | Attending: Surgery | Admitting: Surgery

## 2014-01-12 ENCOUNTER — Encounter (HOSPITAL_COMMUNITY): Payer: Self-pay | Admitting: *Deleted

## 2014-01-12 ENCOUNTER — Encounter (HOSPITAL_COMMUNITY): Payer: Medicare Other | Admitting: Vascular Surgery

## 2014-01-12 DIAGNOSIS — G4700 Insomnia, unspecified: Secondary | ICD-10-CM | POA: Insufficient documentation

## 2014-01-12 DIAGNOSIS — Z9049 Acquired absence of other specified parts of digestive tract: Secondary | ICD-10-CM

## 2014-01-12 DIAGNOSIS — G8929 Other chronic pain: Secondary | ICD-10-CM | POA: Diagnosis not present

## 2014-01-12 DIAGNOSIS — M545 Low back pain, unspecified: Secondary | ICD-10-CM | POA: Diagnosis not present

## 2014-01-12 DIAGNOSIS — G609 Hereditary and idiopathic neuropathy, unspecified: Secondary | ICD-10-CM | POA: Diagnosis not present

## 2014-01-12 DIAGNOSIS — K8020 Calculus of gallbladder without cholecystitis without obstruction: Secondary | ICD-10-CM | POA: Diagnosis present

## 2014-01-12 DIAGNOSIS — Z79899 Other long term (current) drug therapy: Secondary | ICD-10-CM | POA: Diagnosis not present

## 2014-01-12 DIAGNOSIS — Z888 Allergy status to other drugs, medicaments and biological substances status: Secondary | ICD-10-CM | POA: Diagnosis not present

## 2014-01-12 DIAGNOSIS — F17200 Nicotine dependence, unspecified, uncomplicated: Secondary | ICD-10-CM | POA: Diagnosis not present

## 2014-01-12 DIAGNOSIS — F329 Major depressive disorder, single episode, unspecified: Secondary | ICD-10-CM | POA: Diagnosis not present

## 2014-01-12 DIAGNOSIS — Z90710 Acquired absence of both cervix and uterus: Secondary | ICD-10-CM | POA: Diagnosis not present

## 2014-01-12 DIAGNOSIS — K8010 Calculus of gallbladder with chronic cholecystitis without obstruction: Secondary | ICD-10-CM | POA: Diagnosis not present

## 2014-01-12 DIAGNOSIS — I10 Essential (primary) hypertension: Secondary | ICD-10-CM | POA: Diagnosis not present

## 2014-01-12 DIAGNOSIS — F3289 Other specified depressive episodes: Secondary | ICD-10-CM | POA: Insufficient documentation

## 2014-01-12 DIAGNOSIS — F411 Generalized anxiety disorder: Secondary | ICD-10-CM | POA: Diagnosis not present

## 2014-01-12 DIAGNOSIS — I4891 Unspecified atrial fibrillation: Secondary | ICD-10-CM | POA: Insufficient documentation

## 2014-01-12 HISTORY — PX: CHOLECYSTECTOMY: SHX55

## 2014-01-12 LAB — PROTIME-INR
INR: 0.98 (ref 0.00–1.49)
Prothrombin Time: 13 seconds (ref 11.6–15.2)

## 2014-01-12 LAB — APTT: aPTT: 27 seconds (ref 24–37)

## 2014-01-12 SURGERY — LAPAROSCOPIC CHOLECYSTECTOMY WITH INTRAOPERATIVE CHOLANGIOGRAM
Anesthesia: General | Site: Abdomen

## 2014-01-12 MED ORDER — OXYCODONE HCL 5 MG PO TABS
ORAL_TABLET | ORAL | Status: AC
  Filled 2014-01-12: qty 2

## 2014-01-12 MED ORDER — PROPOFOL 10 MG/ML IV BOLUS
INTRAVENOUS | Status: DC | PRN
  Administered 2014-01-12: 140 mg via INTRAVENOUS

## 2014-01-12 MED ORDER — LIDOCAINE HCL (CARDIAC) 20 MG/ML IV SOLN
INTRAVENOUS | Status: DC | PRN
  Administered 2014-01-12: 60 mg via INTRAVENOUS

## 2014-01-12 MED ORDER — PROPOFOL 10 MG/ML IV BOLUS
INTRAVENOUS | Status: AC
  Filled 2014-01-12: qty 20

## 2014-01-12 MED ORDER — FENTANYL CITRATE 0.05 MG/ML IJ SOLN
INTRAMUSCULAR | Status: DC | PRN
  Administered 2014-01-12 (×3): 50 ug via INTRAVENOUS
  Administered 2014-01-12: 100 ug via INTRAVENOUS

## 2014-01-12 MED ORDER — HYDROMORPHONE HCL PF 1 MG/ML IJ SOLN
INTRAMUSCULAR | Status: AC
  Filled 2014-01-12: qty 1

## 2014-01-12 MED ORDER — NEOSTIGMINE METHYLSULFATE 10 MG/10ML IV SOLN
INTRAVENOUS | Status: DC | PRN
  Administered 2014-01-12: 3 mg via INTRAVENOUS

## 2014-01-12 MED ORDER — 0.9 % SODIUM CHLORIDE (POUR BTL) OPTIME
TOPICAL | Status: DC | PRN
Start: 2014-01-12 — End: 2014-01-12
  Administered 2014-01-12: 1000 mL

## 2014-01-12 MED ORDER — SUCCINYLCHOLINE CHLORIDE 20 MG/ML IJ SOLN
INTRAMUSCULAR | Status: AC
  Filled 2014-01-12: qty 1

## 2014-01-12 MED ORDER — SUCCINYLCHOLINE CHLORIDE 20 MG/ML IJ SOLN
INTRAMUSCULAR | Status: DC | PRN
  Administered 2014-01-12: 100 mg via INTRAVENOUS

## 2014-01-12 MED ORDER — ACETAMINOPHEN 325 MG PO TABS
650.0000 mg | ORAL_TABLET | ORAL | Status: DC | PRN
  Filled 2014-01-12: qty 2

## 2014-01-12 MED ORDER — ONDANSETRON HCL 4 MG/2ML IJ SOLN
INTRAMUSCULAR | Status: DC | PRN
Start: 2014-01-12 — End: 2014-01-12
  Administered 2014-01-12: 4 mg via INTRAVENOUS

## 2014-01-12 MED ORDER — GLYCOPYRROLATE 0.2 MG/ML IJ SOLN
INTRAMUSCULAR | Status: AC
  Filled 2014-01-12: qty 2

## 2014-01-12 MED ORDER — SCOPOLAMINE 1 MG/3DAYS TD PT72
MEDICATED_PATCH | TRANSDERMAL | Status: AC
  Filled 2014-01-12: qty 2

## 2014-01-12 MED ORDER — ARTIFICIAL TEARS OP OINT
TOPICAL_OINTMENT | OPHTHALMIC | Status: AC
  Filled 2014-01-12: qty 3.5

## 2014-01-12 MED ORDER — ONDANSETRON HCL 4 MG/2ML IJ SOLN
INTRAMUSCULAR | Status: AC
  Filled 2014-01-12: qty 2

## 2014-01-12 MED ORDER — AMLODIPINE BESYLATE 5 MG PO TABS: 5.0000 mg | ORAL_TABLET | Freq: Every day | ORAL | Status: DC

## 2014-01-12 MED ORDER — HYDROMORPHONE HCL PF 1 MG/ML IJ SOLN
0.5000 mg | INTRAMUSCULAR | Status: DC | PRN
  Administered 2014-01-12 (×3): 0.5 mg via INTRAVENOUS

## 2014-01-12 MED ORDER — ROCURONIUM BROMIDE 50 MG/5ML IV SOLN
INTRAVENOUS | Status: AC
  Filled 2014-01-12: qty 1

## 2014-01-12 MED ORDER — HYDROMORPHONE HCL PF 1 MG/ML IJ SOLN: 0.2500 mg | INTRAMUSCULAR | Status: DC | PRN

## 2014-01-12 MED ORDER — DEXAMETHASONE SODIUM PHOSPHATE 4 MG/ML IJ SOLN
INTRAMUSCULAR | Status: AC
  Filled 2014-01-12: qty 1

## 2014-01-12 MED ORDER — LACTATED RINGERS IV SOLN
INTRAVENOUS | Status: DC
  Administered 2014-01-12 (×2): via INTRAVENOUS

## 2014-01-12 MED ORDER — SODIUM CHLORIDE 0.9 % IR SOLN
Status: DC | PRN
  Administered 2014-01-12: 1

## 2014-01-12 MED ORDER — MORPHINE SULFATE 4 MG/ML IJ SOLN
INTRAMUSCULAR | Status: AC
  Filled 2014-01-12: qty 1

## 2014-01-12 MED ORDER — KETOROLAC TROMETHAMINE 30 MG/ML IJ SOLN
INTRAMUSCULAR | Status: AC
  Filled 2014-01-12: qty 2

## 2014-01-12 MED ORDER — FENTANYL CITRATE 0.05 MG/ML IJ SOLN
INTRAMUSCULAR | Status: AC
  Filled 2014-01-12: qty 5

## 2014-01-12 MED ORDER — METOPROLOL TARTRATE 25 MG PO TABS: 25.0000 mg | ORAL_TABLET | Freq: Two times a day (BID) | ORAL | Status: DC

## 2014-01-12 MED ORDER — LIDOCAINE HCL (CARDIAC) 20 MG/ML IV SOLN
INTRAVENOUS | Status: AC
  Filled 2014-01-12: qty 5

## 2014-01-12 MED ORDER — GLYCOPYRROLATE 0.2 MG/ML IJ SOLN
INTRAMUSCULAR | Status: DC | PRN
  Administered 2014-01-12: 0.4 mg via INTRAVENOUS

## 2014-01-12 MED ORDER — OXYCODONE HCL 5 MG PO TABS
5.0000 mg | ORAL_TABLET | ORAL | Status: DC | PRN
  Administered 2014-01-12: 10 mg via ORAL

## 2014-01-12 MED ORDER — NEOSTIGMINE METHYLSULFATE 10 MG/10ML IV SOLN
INTRAVENOUS | Status: AC
  Filled 2014-01-12: qty 1

## 2014-01-12 MED ORDER — BUPIVACAINE-EPINEPHRINE 0.25% -1:200000 IJ SOLN
INTRAMUSCULAR | Status: DC | PRN
  Administered 2014-01-12: 20 mL

## 2014-01-12 MED ORDER — VECURONIUM BROMIDE 10 MG IV SOLR
INTRAVENOUS | Status: DC | PRN
  Administered 2014-01-12: 2 mg via INTRAVENOUS

## 2014-01-12 MED ORDER — MORPHINE SULFATE 2 MG/ML IJ SOLN
1.0000 mg | INTRAMUSCULAR | Status: DC | PRN
  Administered 2014-01-12: 4 mg via INTRAVENOUS

## 2014-01-12 MED ORDER — ACETAMINOPHEN 650 MG RE SUPP
650.0000 mg | RECTAL | Status: DC | PRN
  Filled 2014-01-12: qty 1

## 2014-01-12 MED ORDER — HYDROCODONE-ACETAMINOPHEN 5-325 MG PO TABS: 1.0000 | ORAL_TABLET | ORAL | Status: DC | PRN

## 2014-01-12 MED ORDER — HYDROMORPHONE HCL PF 1 MG/ML IJ SOLN
INTRAMUSCULAR | Status: AC
  Administered 2014-01-12: 0.5 mg
  Filled 2014-01-12: qty 1

## 2014-01-12 SURGICAL SUPPLY — 38 items
APPLIER CLIP 5 13 M/L LIGAMAX5 (MISCELLANEOUS) ×2
BANDAGE ADH SHEER 1  50/CT (GAUZE/BANDAGES/DRESSINGS) ×8 IMPLANT
BENZOIN TINCTURE PRP APPL 2/3 (GAUZE/BANDAGES/DRESSINGS) ×2 IMPLANT
CANISTER SUCTION 2500CC (MISCELLANEOUS) ×2 IMPLANT
CHLORAPREP W/TINT 26ML (MISCELLANEOUS) ×2 IMPLANT
CLIP APPLIE 5 13 M/L LIGAMAX5 (MISCELLANEOUS) ×1 IMPLANT
COVER MAYO STAND STRL (DRAPES) ×2 IMPLANT
COVER SURGICAL LIGHT HANDLE (MISCELLANEOUS) ×2 IMPLANT
DRAPE C-ARM 42X72 X-RAY (DRAPES) ×2 IMPLANT
DRAPE UTILITY 15X26 W/TAPE STR (DRAPE) ×4 IMPLANT
ELECT REM PT RETURN 9FT ADLT (ELECTROSURGICAL) ×2
ELECTRODE REM PT RTRN 9FT ADLT (ELECTROSURGICAL) ×1 IMPLANT
GLOVE BIOGEL PI IND STRL 6.5 (GLOVE) ×1 IMPLANT
GLOVE BIOGEL PI INDICATOR 6.5 (GLOVE) ×1
GLOVE ORTHOPEDIC STR SZ6.5 (GLOVE) ×2 IMPLANT
GLOVE SURG SIGNA 7.5 PF LTX (GLOVE) ×2 IMPLANT
GOWN STRL REUS W/ TWL LRG LVL3 (GOWN DISPOSABLE) ×2 IMPLANT
GOWN STRL REUS W/ TWL XL LVL3 (GOWN DISPOSABLE) ×1 IMPLANT
GOWN STRL REUS W/TWL LRG LVL3 (GOWN DISPOSABLE) ×2
GOWN STRL REUS W/TWL XL LVL3 (GOWN DISPOSABLE) ×1
KIT BASIN OR (CUSTOM PROCEDURE TRAY) ×2 IMPLANT
KIT ROOM TURNOVER OR (KITS) ×2 IMPLANT
NS IRRIG 1000ML POUR BTL (IV SOLUTION) ×2 IMPLANT
PAD ARMBOARD 7.5X6 YLW CONV (MISCELLANEOUS) ×2 IMPLANT
POUCH SPECIMEN RETRIEVAL 10MM (ENDOMECHANICALS) ×2 IMPLANT
SCISSORS LAP 5X35 DISP (ENDOMECHANICALS) ×2 IMPLANT
SET CHOLANGIOGRAPH 5 50 .035 (SET/KITS/TRAYS/PACK) ×2 IMPLANT
SET IRRIG TUBING LAPAROSCOPIC (IRRIGATION / IRRIGATOR) ×2 IMPLANT
SLEEVE ENDOPATH XCEL 5M (ENDOMECHANICALS) ×4 IMPLANT
SPECIMEN JAR SMALL (MISCELLANEOUS) ×2 IMPLANT
STRIP CLOSURE SKIN 1/2X4 (GAUZE/BANDAGES/DRESSINGS) ×2 IMPLANT
SUT MON AB 4-0 PC3 18 (SUTURE) ×2 IMPLANT
TOWEL OR 17X24 6PK STRL BLUE (TOWEL DISPOSABLE) ×2 IMPLANT
TOWEL OR 17X26 10 PK STRL BLUE (TOWEL DISPOSABLE) ×2 IMPLANT
TRAY LAPAROSCOPIC (CUSTOM PROCEDURE TRAY) ×2 IMPLANT
TROCAR XCEL BLUNT TIP 100MML (ENDOMECHANICALS) ×2 IMPLANT
TROCAR XCEL NON-BLD 5MMX100MML (ENDOMECHANICALS) ×2 IMPLANT
TUBING INSUFF HIGH FLOW RTP (TUBING) ×2 IMPLANT

## 2014-01-12 NOTE — Op Note (Signed)
Laparoscopic Cholecystectomy Procedure Note  Indications: This patient presents with symptomatic gallbladder disease and will undergo laparoscopic cholecystectomy.  Pre-operative Diagnosis: Calculus of gallbladder without mention of cholecystitis or obstruction  Post-operative Diagnosis: Same  Surgeon: Coralie Keens A   Assistants: 0  Anesthesia: General endotracheal anesthesia  ASA Class: 3  Procedure Details  The patient was seen again in the Holding Room. The risks, benefits, complications, treatment options, and expected outcomes were discussed with the patient. The possibilities of reaction to medication, pulmonary aspiration, perforation of viscus, bleeding, recurrent infection, finding a normal gallbladder, the need for additional procedures, failure to diagnose a condition, the possible need to convert to an open procedure, and creating a complication requiring transfusion or operation were discussed with the patient. The likelihood of improving the patient's symptoms with return to their baseline status is good.  The patient and/or family concurred with the proposed plan, giving informed consent. The site of surgery properly noted. The patient was taken to Operating Room, identified as Catherine Orozco and the procedure verified as Laparoscopic Cholecystectomy with Intraoperative Cholangiogram. A Time Out was held and the above information confirmed.  Prior to the induction of general anesthesia, antibiotic prophylaxis was administered. General endotracheal anesthesia was then administered and tolerated well. After the induction, the abdomen was prepped with Chloraprep and draped in sterile fashion. The patient was positioned in the supine position.  Local anesthetic agent was injected into the skin near the umbilicus and an incision made. We dissected down to the abdominal fascia with blunt dissection.  The fascia was incised vertically and we entered the peritoneal cavity bluntly.   A pursestring suture of 0-Vicryl was placed around the fascial opening.  The Hasson cannula was inserted and secured with the stay suture.  Pneumoperitoneum was then created with CO2 and tolerated well without any adverse changes in the patient's vital signs. An 11-mm port was placed in the subxiphoid position.  Two 5-mm ports were placed in the right upper quadrant. All skin incisions were infiltrated with a local anesthetic agent before making the incision and placing the trocars.   We positioned the patient in reverse Trendelenburg, tilted slightly to the patient's left.  The gallbladder was identified, the fundus grasped and retracted cephalad. Adhesions were lysed bluntly and with the electrocautery where indicated, taking care not to injure any adjacent organs or viscus. The infundibulum was grasped and retracted laterally, exposing the peritoneum overlying the triangle of Calot. This was then divided and exposed in a blunt fashion. The cystic duct was clearly identified and bluntly dissected circumferentially. A critical view of the cystic duct and cystic artery was obtained.  The cystic duct was then ligated with clips and divided. The cystic artery was, dissected free, ligated with clips and divided as well.   The gallbladder was dissected from the liver bed in retrograde fashion with the electrocautery. The gallbladder was removed and placed in an Endocatch sac. The liver bed was irrigated and inspected. Hemostasis was achieved with the electrocautery. Copious irrigation was utilized and was repeatedly aspirated until clear.  The gallbladder and Endocatch sac were then removed through the umbilical port site.  The pursestring suture was used to close the umbilical fascia.    We again inspected the right upper quadrant for hemostasis.  Pneumoperitoneum was released as we removed the trocars.  4-0 Monocryl was used to close the skin.   Benzoin, steri-strips, and clean dressings were applied. The  patient was then extubated and brought to the recovery room  in stable condition. Instrument, sponge, and needle counts were correct at closure and at the conclusion of the case.   Findings: Chronic Cholecystitis with Cholelithiasis  Estimated Blood Loss: Minimal         Drains: 0         Specimens: Gallbladder           Complications: None; patient tolerated the procedure well.         Disposition: PACU - hemodynamically stable.         Condition: stable

## 2014-01-12 NOTE — Progress Notes (Signed)
Beta blocker held due to heart rate of 60.

## 2014-01-12 NOTE — Interval H&P Note (Signed)
History and Physical Interval Note: no change in H and P  01/12/2014 10:46 AM  Catherine Orozco  has presented today for surgery, with the diagnosis of symptomatic cholelithiasis  The various methods of treatment have been discussed with the patient and family. After consideration of risks, benefits and other options for treatment, the patient has consented to  Procedure(s): LAPAROSCOPIC CHOLECYSTECTOMY WITH INTRAOPERATIVE CHOLANGIOGRAM (N/A) as a surgical intervention .  The patient's history has been reviewed, patient examined, no change in status, stable for surgery.  I have reviewed the patient's chart and labs.  Questions were answered to the patient's satisfaction.     Vilas Edgerly A

## 2014-01-12 NOTE — Anesthesia Preprocedure Evaluation (Addendum)
Anesthesia Evaluation  Patient identified by MRN, date of birth, ID band Patient awake    Reviewed: Allergy & Precautions, H&P , NPO status , Patient's Chart, lab work & pertinent test results  Airway Mallampati: II      Dental   Pulmonary Current Smoker,  breath sounds clear to auscultation        Cardiovascular hypertension, Rhythm:Regular Rate:Normal     Neuro/Psych Anxiety Depression    GI/Hepatic Neg liver ROS, GI history noted. CE   Endo/Other    Renal/GU negative Renal ROS     Musculoskeletal   Abdominal   Peds  Hematology   Anesthesia Other Findings   Reproductive/Obstetrics                          Anesthesia Physical Anesthesia Plan  ASA: III  Anesthesia Plan: General   Post-op Pain Management:    Induction: Intravenous  Airway Management Planned: Oral ETT  Additional Equipment:   Intra-op Plan:   Post-operative Plan: Extubation in OR  Informed Consent: I have reviewed the patients History and Physical, chart, labs and discussed the procedure including the risks, benefits and alternatives for the proposed anesthesia with the patient or authorized representative who has indicated his/her understanding and acceptance.   Dental advisory given  Plan Discussed with: CRNA, Anesthesiologist and Surgeon  Anesthesia Plan Comments:         Anesthesia Quick Evaluation

## 2014-01-12 NOTE — Transfer of Care (Signed)
Immediate Anesthesia Transfer of Care Note  Patient: Catherine Orozco  Procedure(s) Performed: Procedure(s): LAPAROSCOPIC CHOLECYSTECTOMY  (N/A)  Patient Location: PACU  Anesthesia Type:General  Level of Consciousness: awake, alert  and oriented  Airway & Oxygen Therapy: Patient Spontanous Breathing  Post-op Assessment: Report given to PACU RN and Post -op Vital signs reviewed and stable  Post vital signs: Reviewed and stable  Complications: No apparent anesthesia complications

## 2014-01-12 NOTE — Anesthesia Postprocedure Evaluation (Signed)
  Anesthesia Post-op Note  Patient: Catherine Orozco  Procedure(s) Performed: Procedure(s): LAPAROSCOPIC CHOLECYSTECTOMY  (N/A)  Patient Location: PACU  Anesthesia Type:General  Level of Consciousness: awake  Airway and Oxygen Therapy: Patient Spontanous Breathing  Post-op Pain: mild  Post-op Assessment: Post-op Vital signs reviewed  Post-op Vital Signs: Reviewed  Last Vitals:  Filed Vitals:   01/12/14 1230  BP: 140/66  Pulse: 44  Temp:   Resp: 16    Complications: No apparent anesthesia complications

## 2014-01-12 NOTE — Discharge Instructions (Signed)
[facility] ______[facility] °LAPAROSCOPIC SURGERY: POST OP INSTRUCTIONS °Always review your discharge instruction sheet given to you by the facility where your surgery was performed. °IF YOU HAVE DISABILITY OR FAMILY LEAVE FORMS, YOU MUST BRING THEM TO THE OFFICE FOR PROCESSING.   °DO NOT GIVE THEM TO YOUR DOCTOR. ° °1. A prescription for pain medication may be given to you upon discharge.  Take your pain medication as prescribed, if needed.  If narcotic pain medicine is not needed, then you may take acetaminophen (Tylenol) or ibuprofen (Advil) as needed. °2. Take your usually prescribed medications unless otherwise directed. °3. If you need a refill on your pain medication, please contact your pharmacy.  They will contact our office to request authorization. Prescriptions will not be filled after 5pm or on week-ends. °4. You should follow a light diet the first few days after arrival home, such as soup and crackers, etc.  Be sure to include lots of fluids daily. °5. Most patients will experience some swelling and bruising in the area of the incisions.  Ice packs will help.  Swelling and bruising can take several days to resolve.  °6. It is common to experience some constipation if taking pain medication after surgery.  Increasing fluid intake and taking a stool softener (such as Colace) will usually help or prevent this problem from occurring.  A mild laxative (Milk of Magnesia or Miralax) should be taken according to package instructions if there are no bowel movements after 48 hours. °7. Unless discharge instructions indicate otherwise, you may remove your bandages 24-48 hours after surgery, and you may shower at that time.  You may have steri-strips (small skin tapes) in place directly over the incision.  These strips should be left on the skin for 7-10 days.  If your surgeon used skin glue on the incision, you may shower in 24 hours.  The glue will flake off over the next 2-3 weeks.  Any sutures or  staples will be removed at the office during your follow-up visit. °8. ACTIVITIES:  You may resume regular (light) daily activities beginning the next day--such as daily self-care, walking, climbing stairs--gradually increasing activities as tolerated.  You may have sexual intercourse when it is comfortable.  Refrain from any heavy lifting or straining until approved by your doctor. °a. You may drive when you are no longer taking prescription pain medication, you can comfortably wear a seatbelt, and you can safely maneuver your car and apply brakes. °b. RETURN TO WORK:  __________________________________________________________ °9. You should see your doctor in the office for a follow-up appointment approximately 2-3 weeks after your surgery.  Make sure that you call for this appointment within a day or two after you arrive home to insure a convenient appointment time. °10. OTHER INSTRUCTIONS: __________________________________________________________________________________________________________________________ __________________________________________________________________________________________________________________________ °WHEN TO CALL YOUR DOCTOR: °1. Fever over 101.0 °2. Inability to urinate °3. Continued bleeding from incision. °4. Increased pain, redness, or drainage from the incision. °5. Increasing abdominal pain ° °The clinic staff is available to answer your questions during regular business hours.  Please don’t hesitate to call and ask to speak to one of the nurses for clinical concerns.  If you have a medical emergency, go to the nearest emergency room or call 911.  A surgeon from [facility] is always on call at the hospital. °[address], [address], [place], [place]  [address] ? [address] [address], [place],    [address] °[phone] ? [phone] ? FAX [phone] °Web site:   [email or web address] °

## 2014-01-14 ENCOUNTER — Encounter (HOSPITAL_COMMUNITY): Payer: Self-pay | Admitting: Surgery

## 2014-02-01 ENCOUNTER — Telehealth: Payer: Self-pay | Admitting: Family Medicine

## 2014-02-01 MED ORDER — HYDROCODONE-ACETAMINOPHEN 5-325 MG PO TABS: 1.0000 | ORAL_TABLET | ORAL | Status: DC | PRN

## 2014-02-01 NOTE — Telephone Encounter (Signed)
Last RF 01/12/14  #40  1-2 Q4hrs as needed.  OK refill?

## 2014-02-01 NOTE — Telephone Encounter (Signed)
[phone]  Pt is needing a refill on hydrocodone

## 2014-02-01 NOTE — Telephone Encounter (Signed)
Left message Rx ready for pick up

## 2014-02-01 NOTE — Telephone Encounter (Signed)
ok 

## 2014-02-02 ENCOUNTER — Other Ambulatory Visit: Payer: Self-pay | Admitting: *Deleted

## 2014-02-02 ENCOUNTER — Telehealth: Payer: Self-pay | Admitting: Family Medicine

## 2014-02-02 MED ORDER — HYDROCODONE-ACETAMINOPHEN 5-325 MG PO TABS: 1.0000 | ORAL_TABLET | ORAL | Status: DC | PRN

## 2014-02-02 NOTE — Telephone Encounter (Signed)
Wrote RX for Hydrocodone yesterday x 1.  Daughter calls today and says she picks up 3 month refills at one time and wants the other two.  Not seeing in chart where we have been doing this??  Can we do?

## 2014-02-02 NOTE — Telephone Encounter (Signed)
[place] with 3 months

## 2014-02-02 NOTE — Telephone Encounter (Signed)
Patient daughter in office and prescriptions printed and given to MD for signature.

## 2014-02-03 ENCOUNTER — Telehealth: Payer: Self-pay | Admitting: *Deleted

## 2014-02-03 NOTE — Telephone Encounter (Signed)
Patient's daughter called and was asking about Eloquis form was ready? Please calle

## 2014-02-03 NOTE — Telephone Encounter (Signed)
Spoke w/ pt's daughter.  She states that she dropped off paperwork to be completed by our office for pt to receive assistance w/ the cost of her Eliquis. Advised her that I am leaving this at the front desk for her to pick up at her convenience.  She reports that pt recently had her gallbladder removed and that she is not feeling any better.  She would like for pt to have a colonoscopy, but will speak w/ PCP about setting this up.

## 2014-02-09 ENCOUNTER — Other Ambulatory Visit: Payer: Self-pay | Admitting: Family Medicine

## 2014-02-09 NOTE — Telephone Encounter (Signed)
OK refill?  No date of last refill in chart!

## 2014-02-09 NOTE — Telephone Encounter (Signed)
rx faxed to pharmacy

## 2014-02-15 ENCOUNTER — Ambulatory Visit (INDEPENDENT_AMBULATORY_CARE_PROVIDER_SITE_OTHER): Payer: Medicare Other | Admitting: Family Medicine

## 2014-02-15 ENCOUNTER — Telehealth: Payer: Self-pay | Admitting: Family Medicine

## 2014-02-15 ENCOUNTER — Encounter: Payer: Self-pay | Admitting: Family Medicine

## 2014-02-15 VITALS — BP 100/60 | HR 68 | Temp 97.9°F | Resp 18 | Ht 64.5 in | Wt 183.0 lb

## 2014-02-15 DIAGNOSIS — K529 Noninfective gastroenteritis and colitis, unspecified: Secondary | ICD-10-CM

## 2014-02-15 NOTE — Telephone Encounter (Signed)
Patient is calling to let us know she wants her colonscopy done at [facility] clinic phone number is [phone]

## 2014-02-15 NOTE — Progress Notes (Signed)
Subjective:    Patient ID: Catherine Orozco, female    DOB: 07-Nov-1940, 73 y.o.   MRN: [id number]  HPI Patient was originally scheduled for a complete physical exam today however she comes in complaining of severe abdominal pain. She was seen in June in this clinic with abdominal pain and diarrhea. At that time a CT scan of the abdomen and pelvis was performed at an outside hospital. It showed gallbladder thickening and inflammation around the gallbladder. At that point the patient underwent a cholecystectomy. Unfortunately she continues to have chronic diarrhea. It has been four months duration.  Approach to 6 bowel movements per day which are watery and yellow. She denies any melena or hematochezia. She does report episodic. For instance she is sweating right now complaining of a fever and chills. However she is afebrile on her vital signs. Her abdomen is tender to palpation in all 4 quadrants. It is somewhat distended.  However she has normal bowel sounds.  Patient uses a flu shot. She also refuses a pneumonia vaccine. She is due for Pap smear as well as a mammogram however we determined to hold the physical exam and instead try to determine the cause of her chronic diarrhea and abdominal pain.  He gained 3 pounds since I last patient in March. She denies any weight loss. Past Medical History  Diagnosis Date  . Anxiety   . Depression   . 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 Hypertension   . Insomnia   . Peripheral neuropathy     no lumbar radiculopathy  . A-fib   . Gallstones   . Neuropathy   . Arrhythmia     afib  . Arthritis    Past Surgical History  Procedure Laterality Date  . Abdominal hysterectomy    . Cholecystectomy N/A 01/12/2014    Procedure: LAPAROSCOPIC CHOLECYSTECTOMY ;  Surgeon: Coralie Keens, MD;  Location: [place] [facility];  Service: General;  Laterality: N/A;   Current Outpatient Prescriptions on File Prior to Visit  Medication Sig Dispense Refill  . amLODipine (NORVASC)  5 MG tablet Take 5 mg by mouth daily.      Marland Kitchen apixaban (ELIQUIS) 5 MG TABS tablet Take 5 mg by mouth 2 (two) times daily.      . diazepam (VALIUM) 5 MG tablet TAKE 1 TABLET BY MOUTH TWICE A DAY AS NEEDED-MUST LAST 30 DAYS  60 tablet  2  . fenofibrate (TRICOR) 145 MG tablet Take 145 mg by mouth daily.      Marland Kitchen gabapentin (NEURONTIN) 600 MG tablet Take 600 mg by mouth 2 (two) times daily.      . hydrochlorothiazide (HYDRODIURIL) 25 MG tablet Take 25 mg by mouth daily.      Marland Kitchen HYDROcodone-acetaminophen (NORCO/VICODIN) 5-325 MG per tablet Take 1-2 tablets by mouth every 4 (four) hours as needed for moderate pain.  40 tablet  0  . hydrOXYzine (ATARAX/VISTARIL) 25 MG tablet TAKE 1 TABLET BY MOUTH EVERY NIGHT AT BEDTIME( STOP TAKING TRAZODONE)  30 tablet  3  . metoprolol tartrate (LOPRESSOR) 25 MG tablet Take 25 mg by mouth 2 (two) times daily.      Marland Kitchen pyridOXINE (VITAMIN B-6) 100 MG tablet Take 100 mg by mouth daily.      . vitamin E 400 UNIT capsule Take 400 Units by mouth daily.      . fluconazole (DIFLUCAN) 150 MG tablet Take 1 tablet (150 mg total) by mouth every other day.  4 tablet  0   No current facility-administered medications on file prior to visit.   Allergies  Allergen Reactions  . Paxil [Paroxetine Hcl]     Can't sleep   History   Social History  . Marital Status: Widowed    Spouse Name: N/A    Number of Children: N/A  . Years of Education: N/A   Occupational History  . Not on file.   Social History Main Topics  . 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 Smokeless tobacco: Current User  . Alcohol Use: No  . Drug Use: No  . Sexual Activity: Not on file   Other Topics Concern  . Not on file   Social History Narrative  . No narrative on file   No family history on file.    Review of Systems  All other systems reviewed and are negative.      Objective:   Physical Exam  Vitals reviewed. Constitutional: She  is oriented to person, place, and time. She appears well-developed and well-nourished. No distress.  HENT:  Right Ear: External ear normal.  Left Ear: External ear normal.  Nose: Nose normal.  Mouth/Throat: Oropharynx is clear and moist. No oropharyngeal exudate.  Eyes: Conjunctivae are normal. Right eye exhibits no discharge. Left eye exhibits no discharge. No scleral icterus.  Neck: Normal range of motion. Neck supple. No JVD present. No thyromegaly present.  Cardiovascular: Normal rate and normal heart sounds.  An irregularly irregular rhythm present.  Pulmonary/Chest: Effort normal and breath sounds normal. No respiratory distress. She has no wheezes. She has no rales. She exhibits no tenderness.  Abdominal: Soft. Bowel sounds are normal. She exhibits distension. She exhibits no mass. There is tenderness. There is guarding. There is no rebound.  Lymphadenopathy:    She has no cervical adenopathy.  Neurological: She is alert and oriented to person, place, and time. She has normal reflexes. She displays normal reflexes. No cranial nerve deficit. She exhibits normal muscle tone.  Skin: No rash noted. She is not diaphoretic. No erythema.  Patient is sweating today for no reason but afebrile.          Assessment & Plan:  Chronic diarrhea - Plan: CBC with Differential, COMPLETE METABOLIC PANEL WITH GFR, Sedimentation rate  Patient symptoms have now been continuing for almost 4 months.  They are no better after the gallbladder was removed. Plan concerned about inflammatory bowel disease. I believe the patient requires a GI consultation for colonoscopy. She is tender to palpation in the lower abdomen. She is bloated. Patient had a thyroid checked as well as a celiac panel checked in July which were normal. I will not repeat those today. I will check a sedimentation rate as well as a CBC to look for signs of inflammation or possible autoimmune diseases. I also check a CMP to evaluate for liver  irritation. The patient's colonoscopy is normal, I would treat the patient for irritable bowel disease. Consider using cholestyramine for diarrhea if colonoscopy is normal. I do not believe the diarrhea is infectious as it has been going on since early June making infection unlikely. I will do further mammogram and Pap smear for now. I did recommend immunizations but the patient declined.

## 2014-02-16 LAB — CBC WITH DIFFERENTIAL/PLATELET
Basophils Absolute: 0 10*3/uL (ref 0.0–0.1)
Basophils Relative: 0 % (ref 0–1)
Eosinophils Absolute: 0.1 10*3/uL (ref 0.0–0.7)
Eosinophils Relative: 1 % (ref 0–5)
HEMATOCRIT: 48.7 % — AB (ref 36.0–46.0)
HEMOGLOBIN: 18 g/dL — AB (ref 12.0–15.0)
LYMPHS ABS: 3.1 10*3/uL (ref 0.7–4.0)
Lymphocytes Relative: 27 % (ref 12–46)
MCH: 31.7 pg (ref 26.0–34.0)
MCHC: 37 g/dL — ABNORMAL HIGH (ref 30.0–36.0)
MCV: 85.9 fL (ref 78.0–100.0)
MONOS PCT: 7 % (ref 3–12)
Monocytes Absolute: 0.8 10*3/uL (ref 0.1–1.0)
NEUTROS ABS: 7.3 10*3/uL (ref 1.7–7.7)
NEUTROS PCT: 65 % (ref 43–77)
Platelets: 281 10*3/uL (ref 150–400)
RBC: 5.67 MIL/uL — ABNORMAL HIGH (ref 3.87–5.11)
RDW: 14.3 % (ref 11.5–15.5)
WBC: 11.3 10*3/uL — ABNORMAL HIGH (ref 4.0–10.5)

## 2014-02-16 LAB — COMPLETE METABOLIC PANEL WITH GFR
ALBUMIN: 4.3 g/dL (ref 3.5–5.2)
ALT: 21 U/L (ref 0–35)
AST: 36 U/L (ref 0–37)
Alkaline Phosphatase: 82 U/L (ref 39–117)
BUN: 13 mg/dL (ref 6–23)
CALCIUM: 9.3 mg/dL (ref 8.4–10.5)
CHLORIDE: 98 meq/L (ref 96–112)
CO2: 23 meq/L (ref 19–32)
Creat: 1.09 mg/dL (ref 0.50–1.10)
GFR, EST AFRICAN AMERICAN: 59 mL/min — AB
GFR, Est Non African American: 51 mL/min — ABNORMAL LOW
Glucose, Bld: 122 mg/dL — ABNORMAL HIGH (ref 70–99)
POTASSIUM: 3.2 meq/L — AB (ref 3.5–5.3)
SODIUM: 136 meq/L (ref 135–145)
TOTAL PROTEIN: 6.6 g/dL (ref 6.0–8.3)
Total Bilirubin: 0.5 mg/dL (ref 0.2–1.2)

## 2014-02-16 LAB — SEDIMENTATION RATE: SED RATE: 4 mm/h (ref 0–22)

## 2014-02-18 ENCOUNTER — Other Ambulatory Visit: Payer: Self-pay | Admitting: Family Medicine

## 2014-02-18 DIAGNOSIS — Z1211 Encounter for screening for malignant neoplasm of colon: Secondary | ICD-10-CM

## 2014-02-18 NOTE — Telephone Encounter (Signed)
Referral placed for gastroenterology to [facility] clinic at pt preference

## 2014-02-19 ENCOUNTER — Other Ambulatory Visit: Payer: Self-pay | Admitting: Family Medicine

## 2014-02-19 MED ORDER — POTASSIUM CHLORIDE CRYS ER 20 MEQ PO TBCR
20.0000 meq | EXTENDED_RELEASE_TABLET | Freq: Every day | ORAL | Status: DC
Start: 2014-02-19 — End: 2016-02-20

## 2014-02-24 ENCOUNTER — Other Ambulatory Visit: Payer: Self-pay | Admitting: Family Medicine

## 2014-02-24 DIAGNOSIS — E876 Hypokalemia: Secondary | ICD-10-CM

## 2014-02-24 DIAGNOSIS — D582 Other hemoglobinopathies: Secondary | ICD-10-CM

## 2014-03-01 ENCOUNTER — Other Ambulatory Visit: Payer: Medicare Other

## 2014-03-01 DIAGNOSIS — D582 Other hemoglobinopathies: Secondary | ICD-10-CM

## 2014-03-01 DIAGNOSIS — E876 Hypokalemia: Secondary | ICD-10-CM

## 2014-03-01 LAB — CBC WITH DIFFERENTIAL/PLATELET
BASOS ABS: 0.1 10*3/uL (ref 0.0–0.1)
BASOS PCT: 1 % (ref 0–1)
EOS ABS: 0.1 10*3/uL (ref 0.0–0.7)
Eosinophils Relative: 1 % (ref 0–5)
HCT: 44.1 % (ref 36.0–46.0)
HEMOGLOBIN: 16 g/dL — AB (ref 12.0–15.0)
Lymphocytes Relative: 23 % (ref 12–46)
Lymphs Abs: 2.5 10*3/uL (ref 0.7–4.0)
MCH: 30.9 pg (ref 26.0–34.0)
MCHC: 36.3 g/dL — AB (ref 30.0–36.0)
MCV: 85.3 fL (ref 78.0–100.0)
MONOS PCT: 6 % (ref 3–12)
Monocytes Absolute: 0.7 10*3/uL (ref 0.1–1.0)
NEUTROS PCT: 69 % (ref 43–77)
Neutro Abs: 7.5 10*3/uL (ref 1.7–7.7)
PLATELETS: 275 10*3/uL (ref 150–400)
RBC: 5.17 MIL/uL — ABNORMAL HIGH (ref 3.87–5.11)
RDW: 13.8 % (ref 11.5–15.5)
WBC: 10.9 10*3/uL — ABNORMAL HIGH (ref 4.0–10.5)

## 2014-03-01 LAB — BASIC METABOLIC PANEL
BUN: 10 mg/dL (ref 6–23)
CALCIUM: 9.5 mg/dL (ref 8.4–10.5)
CO2: 24 mEq/L (ref 19–32)
Chloride: 100 mEq/L (ref 96–112)
Creat: 0.99 mg/dL (ref 0.50–1.10)
GLUCOSE: 149 mg/dL — AB (ref 70–99)
Potassium: 3.9 mEq/L (ref 3.5–5.3)
SODIUM: 137 meq/L (ref 135–145)

## 2014-03-02 ENCOUNTER — Encounter: Payer: Self-pay | Admitting: Family Medicine

## 2014-03-22 ENCOUNTER — Telehealth: Payer: Self-pay | Admitting: Family Medicine

## 2014-03-22 NOTE — Telephone Encounter (Signed)
[name] calling stating that patient is needing a refill on hydrocodone, states only has two left, says she is taking 2 tablets every 4hrs as needed. Says she does not have enough to last her until the 20th of November. Please advise!  Last refill was 03/02/14 states normally gets 3 paper prescriptions.   Call [place] with any questions [phone])

## 2014-03-22 NOTE — Telephone Encounter (Signed)
Patient should get 60 Vicodin per month. That is her chronic dose in which she receives every month. She was inadvertently given 40 last month. Go ahead and give the patient 60 right now and she will not be due again for 60 until December 9

## 2014-03-22 NOTE — Telephone Encounter (Signed)
[name] is calling regarding patient regarding   [phone]

## 2014-03-23 MED ORDER — HYDROCODONE-ACETAMINOPHEN 5-325 MG PO TABS: 1.0000 | ORAL_TABLET | Freq: Two times a day (BID) | ORAL | Status: DC

## 2014-03-23 NOTE — Telephone Encounter (Signed)
RX printed x 3 months, left up front and patient aware to pick up  

## 2014-03-30 ENCOUNTER — Telehealth: Payer: Self-pay | Admitting: Cardiovascular Disease

## 2014-03-30 NOTE — Telephone Encounter (Signed)
Pt daughter called asking if we could give some samples of eliquis to the pt. Pt only has two pills left. Please call pt

## 2014-03-30 NOTE — Telephone Encounter (Signed)
Samples of eliquis available to pick up.

## 2014-04-13 ENCOUNTER — Ambulatory Visit: Payer: Self-pay | Admitting: Unknown Physician Specialty

## 2014-04-13 DIAGNOSIS — F411 Generalized anxiety disorder: Secondary | ICD-10-CM | POA: Insufficient documentation

## 2014-04-13 DIAGNOSIS — M549 Dorsalgia, unspecified: Secondary | ICD-10-CM | POA: Insufficient documentation

## 2014-04-13 DIAGNOSIS — F329 Major depressive disorder, single episode, unspecified: Secondary | ICD-10-CM | POA: Insufficient documentation

## 2014-04-13 DIAGNOSIS — R1084 Generalized abdominal pain: Secondary | ICD-10-CM | POA: Insufficient documentation

## 2014-04-13 DIAGNOSIS — G64 Other disorders of peripheral nervous system: Secondary | ICD-10-CM | POA: Insufficient documentation

## 2014-04-15 ENCOUNTER — Ambulatory Visit: Payer: Self-pay | Admitting: Unknown Physician Specialty

## 2014-04-15 LAB — CLOSTRIDIUM DIFFICILE(ARMC)

## 2014-04-18 LAB — STOOL CULTURE

## 2014-04-20 ENCOUNTER — Telehealth: Payer: Self-pay | Admitting: Cardiovascular Disease

## 2014-04-20 NOTE — Telephone Encounter (Signed)
Paperwork received.  On Christell Faith, PA's desk for review.

## 2014-04-20 NOTE — Telephone Encounter (Signed)
Dr.Elliots office calling Caryl Pina) she wanted to know, since she has faxed over twice paper work that the patient is getting a colonscopy done in the next 5 days. Patient is one eliquis and they need to know today, how long should patient be off it. Please call Caryl Pina her number is [phone]. Thank you.

## 2014-04-20 NOTE — Telephone Encounter (Signed)
Filled out, signed, and ready to fax.

## 2014-04-20 NOTE — Telephone Encounter (Signed)
Spoke w/ Caryl Pina.  Advised her that Thurmond Butts has cleared pt to proceed w/ colonoscopy and advised that pt hold Eliquis for 2 days prior to procedure, to restart as soon as GI feels able.  Faxed to Ashley's attn at [facility] [phone].

## 2014-04-26 ENCOUNTER — Ambulatory Visit: Payer: Self-pay | Admitting: Unknown Physician Specialty

## 2014-04-27 ENCOUNTER — Encounter: Payer: Self-pay | Admitting: Family Medicine

## 2014-04-29 ENCOUNTER — Telehealth: Payer: Self-pay

## 2014-04-29 NOTE — Telephone Encounter (Signed)
Pt daughter called, has some questions regarding Eliquis. Please call.

## 2014-04-30 NOTE — Telephone Encounter (Signed)
Spoke w/ pt's daughter.  She reports that pt is approved for assistance w/ Eliquis, but their paperwork indicated that pt's meds will be sent to our office. Spoke w/ Abigail Butts at [facility].  Updated office address and asked her to mail meds to pt's home.

## 2014-05-03 ENCOUNTER — Telehealth: Payer: Self-pay | Admitting: *Deleted

## 2014-05-03 ENCOUNTER — Telehealth: Payer: Self-pay

## 2014-05-03 NOTE — Telephone Encounter (Signed)
Spoke w/ [name].  Advised her that I am leaving samples of Eliquis 5 mg at the front desk for pt to pick up at her convenience.  She reports that has had mild chest pain since stopping her Eliquis 2 days ago.  She states that pt denies SOB, n/v or sweating.  Advised her to have pt call 911 if sx become emergent.

## 2014-05-03 NOTE — Telephone Encounter (Signed)
Patient has been approved for Eliquis assistance foundation. Patient is able to receive Eliquis free of cost from 04/29/2014-05/13/2014.

## 2014-05-03 NOTE — Telephone Encounter (Signed)
Pt daughter called, would like Eliquis samples, states pt has been off for 2 days, and has been having chest pain. Please call.

## 2014-05-04 ENCOUNTER — Telehealth: Payer: Self-pay | Admitting: *Deleted

## 2014-05-04 NOTE — Telephone Encounter (Signed)
Eliquis 5 mg #60 tablets #3 bottles received from assistance program.  Lmovm to pick medications @ front desk.

## 2014-05-10 ENCOUNTER — Other Ambulatory Visit: Payer: Self-pay | Admitting: Physician Assistant

## 2014-05-10 NOTE — Telephone Encounter (Signed)
Medication refilled per protocol. 

## 2014-05-13 ENCOUNTER — Other Ambulatory Visit: Payer: Self-pay | Admitting: Physician Assistant

## 2014-05-13 NOTE — Telephone Encounter (Signed)
Duplicate request

## 2014-05-21 ENCOUNTER — Encounter: Payer: Self-pay | Admitting: Family Medicine

## 2014-05-25 ENCOUNTER — Ambulatory Visit: Payer: Medicare Other | Admitting: Cardiovascular Disease

## 2014-05-27 ENCOUNTER — Encounter: Payer: Self-pay | Admitting: Family Medicine

## 2014-05-27 DIAGNOSIS — K635 Polyp of colon: Secondary | ICD-10-CM | POA: Insufficient documentation

## 2014-06-14 ENCOUNTER — Ambulatory Visit: Payer: Self-pay | Admitting: Cardiovascular Disease

## 2014-06-15 ENCOUNTER — Other Ambulatory Visit: Payer: Self-pay | Admitting: Family Medicine

## 2014-06-15 NOTE — Telephone Encounter (Signed)
?   OK to Refill  

## 2014-06-15 NOTE — Telephone Encounter (Signed)
Ok NTBS next month fasting for labs.

## 2014-06-17 ENCOUNTER — Encounter: Payer: Self-pay | Admitting: Family Medicine

## 2014-06-17 ENCOUNTER — Ambulatory Visit (INDEPENDENT_AMBULATORY_CARE_PROVIDER_SITE_OTHER): Payer: Medicare Other | Admitting: Family Medicine

## 2014-06-17 VITALS — BP 102/70 | HR 68 | Temp 97.8°F | Resp 18 | Wt 186.0 lb

## 2014-06-17 DIAGNOSIS — K529 Noninfective gastroenteritis and colitis, unspecified: Secondary | ICD-10-CM | POA: Diagnosis not present

## 2014-06-17 MED ORDER — HYDROCODONE-ACETAMINOPHEN 5-325 MG PO TABS: 1.0000 | ORAL_TABLET | Freq: Two times a day (BID) | ORAL | Status: DC

## 2014-06-17 NOTE — Progress Notes (Signed)
Subjective:    Patient ID: Catherine Orozco, female    DOB: 01/09/1941, 74 y.o.   MRN: [id number]  Diarrhea   02/26/15 Patient was originally scheduled for a complete physical exam today however she comes in complaining of severe abdominal pain. She was seen in June in this clinic with abdominal pain and diarrhea. At that time a CT scan of the abdomen and pelvis was performed at an outside hospital. It showed gallbladder thickening and inflammation around the gallbladder. At that point the patient underwent a cholecystectomy. Unfortunately she continues to have chronic diarrhea. It has been four months duration.  Approach to 6 bowel movements per day which are watery and yellow. She denies any melena or hematochezia. She does report episodic. For instance she is sweating right now complaining of a fever and chills. However she is afebrile on her vital signs. Her abdomen is tender to palpation in all 4 quadrants. It is somewhat distended.  However she has normal bowel sounds.  Patient uses a flu shot. She also refuses a pneumonia vaccine. She is due for Pap smear as well as a mammogram however we determined to hold the physical exam and instead try to determine the cause of her chronic diarrhea and abdominal pain.  He gained 3 pounds since I last patient in March. She denies any weight loss.  At that time, my plan was: Patient symptoms have now been continuing for almost 4 months.  They are no better after the gallbladder was removed. Plan concerned about inflammatory bowel disease. I believe the patient requires a GI consultation for colonoscopy. She is tender to palpation in the lower abdomen. She is bloated. Patient had a thyroid checked as well as a celiac panel checked in July which were normal. I will not repeat those today. I will check a sedimentation rate as well as a CBC to look for signs of inflammation or possible autoimmune diseases. I also check a CMP to evaluate for liver irritation. The  patient's colonoscopy is normal, I would treat the patient for irritable bowel disease. Consider using cholestyramine for diarrhea if colonoscopy is normal. I do not believe the diarrhea is infectious as it has been going on since early June making infection unlikely. I will do further mammogram and Pap smear for now. I did recommend immunizations but the patient declined.  06/17/14  a she had a colonoscopy in December which revealed mild inflammation in the colon but biopsies were negative for any colitis. The patient continues to have 2-3 watery bowel movements a day and occasional crampy abdominal pain. She denies any fevers or chills. She has not lost any weight. She denies any melena or hematochezia Past Medical History  Diagnosis Date  . Anxiety   . Depression   . 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 Hypertension   . Insomnia   . Peripheral neuropathy     no lumbar radiculopathy  . A-fib   . Gallstones   . Neuropathy   . Arrhythmia     afib  . Arthritis   . Colon polyps     due again 04/2017   Past Surgical History  Procedure Laterality Date  . Abdominal hysterectomy    . Cholecystectomy N/A 01/12/2014    Procedure: LAPAROSCOPIC CHOLECYSTECTOMY ;  Surgeon: Coralie Keens, MD;  Location: [place];  Service: General;  Laterality: N/A;   Current Outpatient Prescriptions on File Prior to Visit  Medication Sig Dispense Refill  . amLODipine (  NORVASC) 5 MG tablet Take 5 mg by mouth daily.    Marland Kitchen apixaban (ELIQUIS) 5 MG TABS tablet Take 5 mg by mouth 2 (two) times daily.    . diazepam (VALIUM) 5 MG tablet TAKE 1 TABLET BY MOUTH TWICE A DAY AS NEEDED-MUST LAST 30 DAYS 60 tablet 2  . fenofibrate (TRICOR) 145 MG tablet Take 145 mg by mouth daily.    Marland Kitchen gabapentin (NEURONTIN) 600 MG tablet Take 600 mg by mouth 2 (two) times daily.    . hydrochlorothiazide (HYDRODIURIL) 25 MG tablet Take 25 mg by mouth daily.    . hydrOXYzine (ATARAX/VISTARIL) 25 MG tablet TAKE 1 TABLET BY MOUTH EVERY NIGHT  AT BEDTIME. ** STOP TAKING TRAZODONE** 30 tablet 1  . metoprolol tartrate (LOPRESSOR) 25 MG tablet Take 25 mg by mouth 2 (two) times daily.    . potassium chloride SA (K-DUR,KLOR-CON) 20 MEQ tablet Take 1 tablet (20 mEq total) by mouth daily. 7 tablet 0  . pyridOXINE (VITAMIN B-6) 100 MG tablet Take 100 mg by mouth daily.    . vitamin E 400 UNIT capsule Take 400 Units by mouth daily.     No current facility-administered medications on file prior to visit.   Allergies  Allergen Reactions  . Paxil [Paroxetine Hcl]     Can't sleep   History   Social History  . Marital Status: Widowed    Spouse Name: N/A    Number of Children: N/A  . Years of Education: N/A   Occupational History  . Not on file.   Social History Main Topics  . 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 Smokeless tobacco: Current User  . Alcohol Use: No  . Drug Use: No  . Sexual Activity: Not on file   Other Topics Concern  . Not on file   Social History Narrative   No family history on file.    Review of Systems  Gastrointestinal: Positive for diarrhea.  All other systems reviewed and are negative.      Objective:   Physical Exam  Constitutional: She is oriented to person, place, and time. She appears well-developed and well-nourished. No distress.  HENT:  Right Ear: External ear normal.  Left Ear: External ear normal.  Nose: Nose normal.  Mouth/Throat: Oropharynx is clear and moist. No oropharyngeal exudate.  Eyes: Conjunctivae are normal. Right eye exhibits no discharge. Left eye exhibits no discharge. No scleral icterus.  Neck: Normal range of motion. Neck supple. No JVD present. No thyromegaly present.  Cardiovascular: Normal rate and normal heart sounds.  An irregularly irregular rhythm present.  Pulmonary/Chest: Effort normal and breath sounds normal. No respiratory distress. She has no wheezes. She has no rales. She exhibits no  tenderness.  Abdominal: Soft. Bowel sounds are normal. She exhibits distension. She exhibits no mass. There is tenderness. There is guarding. There is no rebound.  Lymphadenopathy:    She has no cervical adenopathy.  Neurological: She is alert and oriented to person, place, and time. She has normal reflexes. No cranial nerve deficit. She exhibits normal muscle tone.  Skin: No rash noted. She is not diaphoretic. No erythema.  Vitals reviewed.           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 I will have the patient start align 1 poqday.  Consider xifaxan.   I also asked the patient to discontinue  potassium. I would like to see the patient back in 3 weeks to see if this is beneficial. I did give patient refill on her hydrocodone 60 tablets. Also gave her 2 refills.   When she returns in 3 weeks I would like to check fasting blood work including a CBC, CMP, fasting lipid panel, and TSH. At that time I'll also check a celiac panel.

## 2014-06-17 NOTE — Telephone Encounter (Signed)
?   OK to Refill  

## 2014-06-18 ENCOUNTER — Telehealth: Payer: Self-pay | Admitting: *Deleted

## 2014-06-18 NOTE — Telephone Encounter (Signed)
Just stop potassium  Not BP med.

## 2014-06-18 NOTE — Telephone Encounter (Signed)
Received call from patient daughter, [name].   Reports that patient was told during OV to stop one of her HTN meds, but could not remember which one.   Noted that K+ was to de discontinued from [facility].   MD please advise.

## 2014-06-18 NOTE — Telephone Encounter (Signed)
ok 

## 2014-06-18 NOTE — Telephone Encounter (Signed)
Call placed to patient daughter and she was made aware.

## 2014-07-08 ENCOUNTER — Encounter: Payer: Self-pay | Admitting: Family Medicine

## 2014-07-08 ENCOUNTER — Ambulatory Visit (INDEPENDENT_AMBULATORY_CARE_PROVIDER_SITE_OTHER): Payer: Medicare Other | Admitting: Family Medicine

## 2014-07-08 VITALS — BP 128/78 | HR 74 | Temp 97.6°F | Resp 20 | Ht 64.5 in | Wt 187.0 lb

## 2014-07-08 DIAGNOSIS — Z13220 Encounter for screening for lipoid disorders: Secondary | ICD-10-CM | POA: Diagnosis not present

## 2014-07-08 DIAGNOSIS — K529 Noninfective gastroenteritis and colitis, unspecified: Secondary | ICD-10-CM

## 2014-07-08 DIAGNOSIS — E785 Hyperlipidemia, unspecified: Secondary | ICD-10-CM | POA: Diagnosis not present

## 2014-07-08 LAB — CBC WITH DIFFERENTIAL/PLATELET
BASOS ABS: 0 10*3/uL (ref 0.0–0.1)
BASOS PCT: 0 % (ref 0–1)
EOS ABS: 0.1 10*3/uL (ref 0.0–0.7)
EOS PCT: 1 % (ref 0–5)
HCT: 46.3 % — ABNORMAL HIGH (ref 36.0–46.0)
Hemoglobin: 16.6 g/dL — ABNORMAL HIGH (ref 12.0–15.0)
Lymphocytes Relative: 32 % (ref 12–46)
Lymphs Abs: 3.1 10*3/uL (ref 0.7–4.0)
MCH: 31.7 pg (ref 26.0–34.0)
MCHC: 35.9 g/dL (ref 30.0–36.0)
MCV: 88.4 fL (ref 78.0–100.0)
MPV: 10 fL (ref 8.6–12.4)
Monocytes Absolute: 0.6 10*3/uL (ref 0.1–1.0)
Monocytes Relative: 6 % (ref 3–12)
NEUTROS PCT: 61 % (ref 43–77)
Neutro Abs: 5.9 10*3/uL (ref 1.7–7.7)
PLATELETS: 250 10*3/uL (ref 150–400)
RBC: 5.24 MIL/uL — ABNORMAL HIGH (ref 3.87–5.11)
RDW: 13.7 % (ref 11.5–15.5)
WBC: 9.6 10*3/uL (ref 4.0–10.5)

## 2014-07-08 LAB — COMPLETE METABOLIC PANEL WITH GFR
ALBUMIN: 4.3 g/dL (ref 3.5–5.2)
ALK PHOS: 85 U/L (ref 39–117)
ALT: 22 U/L (ref 0–35)
AST: 34 U/L (ref 0–37)
BILIRUBIN TOTAL: 0.7 mg/dL (ref 0.2–1.2)
BUN: 11 mg/dL (ref 6–23)
CHLORIDE: 97 meq/L (ref 96–112)
CO2: 24 meq/L (ref 19–32)
Calcium: 9.5 mg/dL (ref 8.4–10.5)
Creat: 1.08 mg/dL (ref 0.50–1.10)
GFR, Est African American: 59 mL/min — ABNORMAL LOW
GFR, Est Non African American: 51 mL/min — ABNORMAL LOW
GLUCOSE: 103 mg/dL — AB (ref 70–99)
Potassium: 3.7 mEq/L (ref 3.5–5.3)
Sodium: 136 mEq/L (ref 135–145)
TOTAL PROTEIN: 6.7 g/dL (ref 6.0–8.3)

## 2014-07-08 LAB — LIPID PANEL
CHOLESTEROL: 215 mg/dL — AB (ref 0–200)
HDL: 37 mg/dL — ABNORMAL LOW (ref >=46)
Total CHOL/HDL Ratio: 5.8 Ratio
Triglycerides: 551 mg/dL — ABNORMAL HIGH (ref <150)

## 2014-07-08 LAB — TSH: TSH: 1.945 u[IU]/mL (ref 0.350–4.500)

## 2014-07-08 NOTE — Progress Notes (Signed)
Subjective:    Patient ID: Catherine Orozco, female    DOB: 03/07/1941, 74 y.o.   MRN: [id number]  Diarrhea   02/26/15 Patient was originally scheduled for a complete physical exam today however she comes in complaining of severe abdominal pain. She was seen in June in this clinic with abdominal pain and diarrhea. At that time a CT scan of the abdomen and pelvis was performed at an outside hospital. It showed gallbladder thickening and inflammation around the gallbladder. At that point the patient underwent a cholecystectomy. Unfortunately she continues to have chronic diarrhea. It has been four months duration.  Approach to 6 bowel movements per day which are watery and yellow. She denies any melena or hematochezia. She does report episodic. For instance she is sweating right now complaining of a fever and chills. However she is afebrile on her vital signs. Her abdomen is tender to palpation in all 4 quadrants. It is somewhat distended.  However she has normal bowel sounds.  Patient uses a flu shot. She also refuses a pneumonia vaccine. She is due for Pap smear as well as a mammogram however we determined to hold the physical exam and instead try to determine the cause of her chronic diarrhea and abdominal pain.  He gained 3 pounds since I last patient in March. She denies any weight loss.  At that time, my plan was: Patient symptoms have now been continuing for almost 4 months.  They are no better after the gallbladder was removed. Plan concerned about inflammatory bowel disease. I believe the patient requires a GI consultation for colonoscopy. She is tender to palpation in the lower abdomen. She is bloated. Patient had a thyroid checked as well as a celiac panel checked in July which were normal. I will not repeat those today. I will check a sedimentation rate as well as a CBC to look for signs of inflammation or possible autoimmune diseases. I also check a CMP to evaluate for liver irritation. The  patient's colonoscopy is normal, I would treat the patient for irritable bowel disease. Consider using cholestyramine for diarrhea if colonoscopy is normal. I do not believe the diarrhea is infectious as it has been going on since early June making infection unlikely. I will do further mammogram and Pap smear for now. I did recommend immunizations but the patient declined.  06/17/14  a she had a colonoscopy in December which revealed mild inflammation in the colon but biopsies were negative for any colitis. The patient continues to have 2-3 watery bowel movements a day and occasional crampy abdominal pain. She denies any fevers or chills. She has not lost any weight. She denies any melena or hematochezia.  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 I will have the patient start align 1 poqday.  Consider xifaxan.   I also asked the patient to discontinue potassium. I would like to see the patient back in 3 weeks to see if this is beneficial. I did give patient refill on her hydrocodone 60 tablets. Also gave her 2 refills.   When she returns in 3 weeks I would like to check fasting blood work including a CBC, CMP, fasting lipid panel, and TSH. At that time I'll also check a celiac panel.  07/08/14 Patient's diarrhea did not improve after we stopped the potassium and added the probiotic. She is here fasting today to check lab work including a CBC, CMP, lipid panel, celiac panel, and  TSH. Past Medical History  Diagnosis Date  . Anxiety   . Depression   . 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 Hypertension   . Insomnia   . Peripheral neuropathy     no lumbar radiculopathy  . A-fib   . Gallstones   . Neuropathy   . Arrhythmia     afib  . Arthritis   . Colon polyps     due again 04/2017   Past Surgical History  Procedure Laterality Date  . Abdominal hysterectomy    . Cholecystectomy N/A 01/12/2014    Procedure: LAPAROSCOPIC CHOLECYSTECTOMY ;   Surgeon: Coralie Keens, MD;  Location: [facility];  Service: General;  Laterality: N/A;   Current Outpatient Prescriptions on File Prior to Visit  Medication Sig Dispense Refill  . apixaban (ELIQUIS) 5 MG TABS tablet Take 5 mg by mouth 2 (two) times daily.    . diazepam (VALIUM) 5 MG tablet TAKE 1 TABLET BY MOUTH TWICE A DAY AS NEEDED **MUST LAST 30 DAYS** 60 tablet 2  . fenofibrate (TRICOR) 145 MG tablet Take 145 mg by mouth daily.    Marland Kitchen gabapentin (NEURONTIN) 600 MG tablet Take 600 mg by mouth 2 (two) times daily.    . hydrochlorothiazide (HYDRODIURIL) 25 MG tablet Take 25 mg by mouth daily.    Marland Kitchen HYDROcodone-acetaminophen (NORCO/VICODIN) 5-325 MG per tablet Take 1 tablet by mouth 2 (two) times daily. 60 tablet 0  . hydrOXYzine (ATARAX/VISTARIL) 25 MG tablet TAKE 1 TABLET BY MOUTH EVERY NIGHT AT BEDTIME. ** STOP TAKING TRAZODONE** 30 tablet 1  . metoprolol tartrate (LOPRESSOR) 25 MG tablet Take 25 mg by mouth 2 (two) times daily.    . potassium chloride SA (K-DUR,KLOR-CON) 20 MEQ tablet Take 1 tablet (20 mEq total) by mouth daily. 7 tablet 0  . pyridOXINE (VITAMIN B-6) 100 MG tablet Take 100 mg by mouth daily.    . vitamin E 400 UNIT capsule Take 400 Units by mouth daily.    Marland Kitchen amLODipine (NORVASC) 5 MG tablet Take 5 mg by mouth daily.     No current facility-administered medications on file prior to visit.   Allergies  Allergen Reactions  . Paxil [Paroxetine Hcl]     Can't sleep   History   Social History  . Marital Status: Widowed    Spouse Name: N/A  . Number of Children: N/A  . Years of Education: N/A   Occupational History  . Not on file.   Social History Main Topics  . 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 Smokeless tobacco: Current User  . Alcohol Use: No  . Drug Use: No  . Sexual Activity: Not on file   Other Topics Concern  . Not on file   Social History Narrative   No family history on  file.    Review of Systems  Gastrointestinal: Positive for diarrhea.  All other systems reviewed and are negative.      Objective:   Physical Exam  Constitutional: She is oriented to person, place, and time. She appears well-developed and well-nourished. No distress.  HENT:  Right Ear: External ear normal.  Left Ear: External ear normal.  Nose: Nose normal.  Mouth/Throat: Oropharynx is clear and moist. No oropharyngeal exudate.  Eyes: Conjunctivae are normal. Right eye exhibits no discharge. Left eye exhibits no discharge. No scleral icterus.  Neck: Normal range of motion. Neck supple. No JVD present. No  thyromegaly present.  Cardiovascular: Normal rate and normal heart sounds.  An irregularly irregular rhythm present.  Pulmonary/Chest: Effort normal and breath sounds normal. No respiratory distress. She has no wheezes. She has no rales. She exhibits no tenderness.  Abdominal: Soft. Bowel sounds are normal. She exhibits distension. She exhibits no mass. There is tenderness. There is guarding. There is no rebound.  Lymphadenopathy:    She has no cervical adenopathy.  Neurological: She is alert and oriented to person, place, and time. She has normal reflexes. No cranial nerve deficit. She exhibits normal muscle tone.  Skin: No rash noted. She is not diaphoretic. No erythema.  Vitals reviewed.           Assessment & Plan:  Chronic diarrhea - Plan: TSH, Celiac panel 10  Screening for cholesterol level - Plan: COMPLETE METABOLIC PANEL WITH GFR, CBC with Differential/Platelet, Lipid panel  I will check her cholesterol. Goal LDL cholesterol is less than 100. I will check a celiac panel as well as a TSH to rule out other causes of chronic diarrhea. If this is normal the patient will had normal lab work, a relatively normal colonoscopy, and her gallbladder removed. At that point I believe this is irritable bowel syndrome. Options include cholestyramine, Xifaxan, or viberzi.  Begin  viberzi 100 mg pobid for 2 weeks and see if diarrhea improves.

## 2014-07-09 LAB — CELIAC PANEL 10
Endomysial Screen: NEGATIVE
Gliadin IgA: 6 Units (ref <20)
Gliadin IgG: 3 Units (ref <20)
IGA: 101 mg/dL (ref 69–380)
Tissue Transglut Ab: 1 U/mL (ref <6)
Tissue Transglutaminase Ab, IgA: 1 U/mL (ref <4)

## 2014-07-14 ENCOUNTER — Other Ambulatory Visit: Payer: Self-pay | Admitting: Family Medicine

## 2014-07-14 NOTE — Telephone Encounter (Signed)
Medication refilled per protocol. 

## 2014-08-02 ENCOUNTER — Telehealth: Payer: Self-pay | Admitting: Family Medicine

## 2014-08-02 ENCOUNTER — Other Ambulatory Visit: Payer: Self-pay | Admitting: Family Medicine

## 2014-08-02 MED ORDER — ELUXADOLINE 100 MG PO TABS: 100.0000 mg | ORAL_TABLET | Freq: Two times a day (BID) | ORAL | Status: DC

## 2014-08-02 NOTE — Telephone Encounter (Signed)
Ok to send in rx? viberzi 100

## 2014-08-02 NOTE — Telephone Encounter (Signed)
Continue viberzi 100 bid

## 2014-08-02 NOTE — Telephone Encounter (Signed)
?   OK to Refill  

## 2014-08-02 NOTE — Telephone Encounter (Signed)
[facility]    Patient is calling to get an rx of the same thing we gave her samples for, for her diarrhea she ran out of samples and it has come back   [phone]

## 2014-08-02 NOTE — Telephone Encounter (Signed)
Med called to pharm and pt aware

## 2014-08-03 ENCOUNTER — Other Ambulatory Visit: Payer: Self-pay | Admitting: Cardiovascular Disease

## 2014-09-06 LAB — SURGICAL PATHOLOGY

## 2014-09-08 ENCOUNTER — Other Ambulatory Visit: Payer: Self-pay | Admitting: Family Medicine

## 2014-09-08 NOTE — Telephone Encounter (Signed)
?   OK to Refill  

## 2014-09-09 ENCOUNTER — Telehealth: Payer: Self-pay | Admitting: Family Medicine

## 2014-09-09 NOTE — Telephone Encounter (Signed)
Catherine Orozco calling to get refill of hydrocodone for her mom  [phone]

## 2014-09-09 NOTE — Telephone Encounter (Signed)
ok 

## 2014-09-09 NOTE — Telephone Encounter (Signed)
?   OK to Refill  

## 2014-09-09 NOTE — Telephone Encounter (Signed)
Medication refilled per protocol. 

## 2014-09-10 MED ORDER — HYDROCODONE-ACETAMINOPHEN 5-325 MG PO TABS: 1.0000 | ORAL_TABLET | Freq: Two times a day (BID) | ORAL | Status: DC

## 2014-09-10 NOTE — Telephone Encounter (Signed)
RX printed, left up front and patient's dtr aware to pick up

## 2014-09-14 ENCOUNTER — Other Ambulatory Visit: Payer: Self-pay | Admitting: Family Medicine

## 2014-09-14 MED ORDER — HYDROCODONE-ACETAMINOPHEN 5-325 MG PO TABS: 1.0000 | ORAL_TABLET | Freq: Two times a day (BID) | ORAL | Status: DC

## 2014-09-20 ENCOUNTER — Ambulatory Visit (INDEPENDENT_AMBULATORY_CARE_PROVIDER_SITE_OTHER): Payer: Medicare Other | Admitting: Family Medicine

## 2014-09-20 ENCOUNTER — Encounter: Payer: Self-pay | Admitting: Family Medicine

## 2014-09-20 VITALS — BP 92/68 | HR 74 | Temp 98.5°F | Resp 18 | Ht 64.5 in | Wt 190.0 lb

## 2014-09-20 DIAGNOSIS — K529 Noninfective gastroenteritis and colitis, unspecified: Secondary | ICD-10-CM | POA: Diagnosis not present

## 2014-09-20 NOTE — Progress Notes (Signed)
Subjective:    Patient ID: Catherine Orozco, female    DOB: 03/07/1941, 74 y.o.   MRN: [id number]  Diarrhea   02/26/15 Patient was originally scheduled for a complete physical exam today however she comes in complaining of severe abdominal pain. She was seen in June in this clinic with abdominal pain and diarrhea. At that time a CT scan of the abdomen and pelvis was performed at an outside hospital. It showed gallbladder thickening and inflammation around the gallbladder. At that point the patient underwent a cholecystectomy. Unfortunately she continues to have chronic diarrhea. It has been four months duration.  Approach to 6 bowel movements per day which are watery and yellow. She denies any melena or hematochezia. She does report episodic. For instance she is sweating right now complaining of a fever and chills. However she is afebrile on her vital signs. Her abdomen is tender to palpation in all 4 quadrants. It is somewhat distended.  However she has normal bowel sounds.  Patient uses a flu shot. She also refuses a pneumonia vaccine. She is due for Pap smear as well as a mammogram however we determined to hold the physical exam and instead try to determine the cause of her chronic diarrhea and abdominal pain.  He gained 3 pounds since I last patient in March. She denies any weight loss.  At that time, my plan was: Patient symptoms have now been continuing for almost 4 months.  They are no better after the gallbladder was removed. Plan concerned about inflammatory bowel disease. I believe the patient requires a GI consultation for colonoscopy. She is tender to palpation in the lower abdomen. She is bloated. Patient had a thyroid checked as well as a celiac panel checked in July which were normal. I will not repeat those today. I will check a sedimentation rate as well as a CBC to look for signs of inflammation or possible autoimmune diseases. I also check a CMP to evaluate for liver irritation. The  patient's colonoscopy is normal, I would treat the patient for irritable bowel disease. Consider using cholestyramine for diarrhea if colonoscopy is normal. I do not believe the diarrhea is infectious as it has been going on since early June making infection unlikely. I will do further mammogram and Pap smear for now. I did recommend immunizations but the patient declined.  06/17/14  a she had a colonoscopy in December which revealed mild inflammation in the colon but biopsies were negative for any colitis. The patient continues to have 2-3 watery bowel movements a day and occasional crampy abdominal pain. She denies any fevers or chills. She has not lost any weight. She denies any melena or hematochezia.  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 I will have the patient start align 1 poqday.  Consider xifaxan.   I also asked the patient to discontinue potassium. I would like to see the patient back in 3 weeks to see if this is beneficial. I did give patient refill on her hydrocodone 60 tablets. Also gave her 2 refills.   When she returns in 3 weeks I would like to check fasting blood work including a CBC, CMP, fasting lipid panel, and TSH. At that time I'll also check a celiac panel.  07/08/14 Patient's diarrhea did not improve after we stopped the potassium and added the probiotic. She is here fasting today to check lab work including a CBC, CMP, lipid panel, celiac panel, and  TSH.  At that time, my plan was: I will check her cholesterol. Goal LDL cholesterol is less than 100. I will check a celiac panel as well as a TSH to rule out other causes of chronic diarrhea. If this is normal the patient will had normal lab work, a relatively normal colonoscopy, and her gallbladder removed. At that point I believe this is irritable bowel syndrome. Options include cholestyramine, Xifaxan, or viberzi.  Begin viberzi 100 mg pobid for 2 weeks and see if diarrhea  improves.   09/20/14 The celiac panel and TSH were nml.  She does not remember if Viberzi helped but the diarrhea has returned.  She has gained 3 lbs since her last ov.  She denies melena or hematochezia.  She does have crampy abd pain Past Medical History  Diagnosis Date  . Anxiety   . Depression   . 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 Hypertension   . Insomnia   . Peripheral neuropathy     no lumbar radiculopathy  . A-fib   . Gallstones   . Neuropathy   . Arrhythmia     afib  . Arthritis   . Colon polyps     due again 04/2017   Past Surgical History  Procedure Laterality Date  . Abdominal hysterectomy    . Cholecystectomy N/A 01/12/2014    Procedure: LAPAROSCOPIC CHOLECYSTECTOMY ;  Surgeon: Coralie Keens, MD;  Location: [facility];  Service: General;  Laterality: N/A;   Current Outpatient Prescriptions on File Prior to Visit  Medication Sig Dispense Refill  . amLODipine (NORVASC) 5 MG tablet Take 5 mg by mouth daily.    Marland Kitchen apixaban (ELIQUIS) 5 MG TABS tablet Take 5 mg by mouth 2 (two) times daily.    . diazepam (VALIUM) 5 MG tablet TAKE 1 TABLET BY MOUTH TWICE A DAY AS NEEDED **MUST LAST 30 DAYS** 60 tablet 2  . fenofibrate (TRICOR) 145 MG tablet Take 145 mg by mouth daily.    Marland Kitchen gabapentin (NEURONTIN) 600 MG tablet Take 600 mg by mouth 2 (two) times daily.    . hydrochlorothiazide (HYDRODIURIL) 25 MG tablet Take 25 mg by mouth daily.    Marland Kitchen HYDROcodone-acetaminophen (NORCO/VICODIN) 5-325 MG per tablet Take 1 tablet by mouth 2 (two) times daily. DO NOT FILL UNTIL 10/13/14 60 tablet 0  . hydrOXYzine (ATARAX/VISTARIL) 25 MG tablet TAKE 1 TABLET BY MOUTH EVERY NIGHT AT BEDTIME. ** STOP TAKING TRAZODONE** 30 tablet 1  . metoprolol tartrate (LOPRESSOR) 25 MG tablet Take 25 mg by mouth 2 (two) times daily.    . potassium chloride SA (K-DUR,KLOR-CON) 20 MEQ tablet Take 1 tablet (20 mEq total) by mouth daily. 7 tablet 0  . pyridOXINE (VITAMIN B-6) 100 MG tablet Take 100 mg by mouth daily.     . simvastatin (ZOCOR) 20 MG tablet TAKE 1 TABLET BY MOUTH AT BEDTIME 90 tablet 0  . vitamin E 400 UNIT capsule Take 400 Units by mouth daily.     No current facility-administered medications on file prior to visit.   Allergies  Allergen Reactions  . Paxil [Paroxetine Hcl]     Can't sleep   History   Social History  . Marital Status: Widowed    Spouse Name: N/A  . Number of Children: N/A  . Years of Education: N/A   Occupational History  . Not on file.   Social History Main Topics  . Smoking status: Current Every Day Smoker -- 1.00 packs/day for 55  years    Types: Cigarettes  . Smokeless tobacco: Current User  . Alcohol Use: No  . Drug Use: No  . Sexual Activity: Not on file   Other Topics Concern  . Not on file   Social History Narrative   No family history on file.    Review of Systems  Gastrointestinal: Positive for diarrhea.  All other systems reviewed and are negative.      Objective:   Physical Exam  Constitutional: She is oriented to person, place, and time. She appears well-developed and well-nourished. No distress.  HENT:  Right Ear: External ear normal.  Left Ear: External ear normal.  Nose: Nose normal.  Mouth/Throat: Oropharynx is clear and moist. No oropharyngeal exudate.  Eyes: Conjunctivae are normal. Right eye exhibits no discharge. Left eye exhibits no discharge. No scleral icterus.  Neck: Normal range of motion. Neck supple. No JVD present. No thyromegaly present.  Cardiovascular: Normal rate and normal heart sounds.  An irregularly irregular rhythm present.  Pulmonary/Chest: Effort normal and breath sounds normal. No respiratory distress. She has no wheezes. She has no rales. She exhibits no tenderness.  Abdominal: Soft. Bowel sounds are normal. She exhibits distension. She exhibits no mass. There is tenderness. There is guarding. There is no rebound.  Lymphadenopathy:    She has no cervical adenopathy.  Neurological: She is alert and  oriented to person, place, and time. She has normal reflexes. No cranial nerve deficit. She exhibits normal muscle tone.  Skin: No rash noted. She is not diaphoretic. No erythema.  Vitals reviewed.           Assessment & Plan:  Chronic diarrhea Resume Viberzi 100 mg pobid and recheck in 1 week. If no better, start cholestyramine and result GI to eval for possible Crohns.

## 2014-09-27 ENCOUNTER — Encounter: Payer: Self-pay | Admitting: Family Medicine

## 2014-09-27 ENCOUNTER — Ambulatory Visit (INDEPENDENT_AMBULATORY_CARE_PROVIDER_SITE_OTHER): Payer: Medicare Other | Admitting: Family Medicine

## 2014-09-27 VITALS — BP 102/70 | HR 76 | Temp 98.0°F | Resp 18 | Ht 64.5 in | Wt 190.0 lb

## 2014-09-27 DIAGNOSIS — R300 Dysuria: Secondary | ICD-10-CM

## 2014-09-27 DIAGNOSIS — K529 Noninfective gastroenteritis and colitis, unspecified: Secondary | ICD-10-CM | POA: Diagnosis not present

## 2014-09-27 LAB — URINALYSIS, ROUTINE W REFLEX MICROSCOPIC
Bilirubin Urine: NEGATIVE
Glucose, UA: NEGATIVE mg/dL
Ketones, ur: NEGATIVE mg/dL
Nitrite: NEGATIVE
PH: 5.5 (ref 5.0–8.0)
Protein, ur: NEGATIVE mg/dL
Specific Gravity, Urine: 1.02 (ref 1.005–1.030)
Urobilinogen, UA: 0.2 mg/dL (ref 0.0–1.0)

## 2014-09-27 LAB — URINALYSIS, MICROSCOPIC ONLY
Casts: NONE SEEN
Crystals: NONE SEEN

## 2014-09-27 MED ORDER — CIPROFLOXACIN HCL 500 MG PO TABS: 500.0000 mg | ORAL_TABLET | Freq: Two times a day (BID) | ORAL | Status: DC

## 2014-09-27 NOTE — Progress Notes (Signed)
Subjective:    Patient ID: Catherine Orozco, female    DOB: Jun 20, 1940, 74 y.o.   MRN: [id number]  Diarrhea   02/26/15 Patient was originally scheduled for a complete physical exam today however she comes in complaining of severe abdominal pain. She was seen in June in this clinic with abdominal pain and diarrhea. At that time a CT scan of the abdomen and pelvis was performed at an outside hospital. It showed gallbladder thickening and inflammation around the gallbladder. At that point the patient underwent a cholecystectomy. Unfortunately she continues to have chronic diarrhea. It has been four months duration.  Approach to 6 bowel movements per day which are watery and yellow. She denies any melena or hematochezia. She does report episodic. For instance she is sweating right now complaining of a fever and chills. However she is afebrile on her vital signs. Her abdomen is tender to palpation in all 4 quadrants. It is somewhat distended.  However she has normal bowel sounds.  Patient uses a flu shot. She also refuses a pneumonia vaccine. She is due for Pap smear as well as a mammogram however we determined to hold the physical exam and instead try to determine the cause of her chronic diarrhea and abdominal pain.  He gained 3 pounds since I last patient in March. She denies any weight loss.  At that time, my plan was: Patient symptoms have now been continuing for almost 4 months.  They are no better after the gallbladder was removed. Plan concerned about inflammatory bowel disease. I believe the patient requires a GI consultation for colonoscopy. She is tender to palpation in the lower abdomen. She is bloated. Patient had a thyroid checked as well as a celiac panel checked in July which were normal. I will not repeat those today. I will check a sedimentation rate as well as a CBC to look for signs of inflammation or possible autoimmune diseases. I also check a CMP to evaluate for liver irritation. The  patient's colonoscopy is normal, I would treat the patient for irritable bowel disease. Consider using cholestyramine for diarrhea if colonoscopy is normal. I do not believe the diarrhea is infectious as it has been going on since early June making infection unlikely. I will do further mammogram and Pap smear for now. I did recommend immunizations but the patient declined.  06/17/14  She had a colonoscopy in December which revealed mild inflammation in the colon but biopsies were negative for any colitis. The patient continues to have 2-3 watery bowel movements a day and occasional crampy abdominal pain. She denies any fevers or chills. She has not lost any weight. She denies any melena or hematochezia.  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 I will have the patient start align 1 poqday.  Consider xifaxan.   I also asked the patient to discontinue potassium. I would like to see the patient back in 3 weeks to see if this is beneficial. I did give patient refill on her hydrocodone 60 tablets. Also gave her 2 refills.   When she returns in 3 weeks I would like to check fasting blood work including a CBC, CMP, fasting lipid panel, and TSH. At that time I'll also check a celiac panel.  07/08/14 Patient's diarrhea did not improve after we stopped the potassium and added the probiotic. She is here fasting today to check lab work including a CBC, CMP, lipid panel, celiac panel, and TSH.  At that time, my plan was:  I will check a celiac panel as well as a TSH to rule out other causes of chronic diarrhea. If this is normal the patient will had normal lab work, a relatively normal colonoscopy, and her gallbladder removed. At that point I believe this is irritable bowel syndrome. Options include cholestyramine, Xifaxan, or viberzi.  Begin viberzi 100 mg pobid for 2 weeks and see if diarrhea improves.   09/20/14 The celiac panel and TSH were nml.  She does not  remember if Viberzi helped but the diarrhea has returned.  She has gained 3 lbs since her last ov.  She denies melena or hematochezia.  She does have crampy abd pain.  At that time, my plan was: Resume Viberzi 100 mg pobid and recheck in 1 week. If no better, start cholestyramine and reconsult  GI to eval for possible Crohns.  09/27/14 Patient is here today for follow-up.  Patient states the diarrhea is some better. However she continues to have watery bowel movements every morning. The diarrhea is approximately 50% better. She is gone from having 4 or 5 watery bowel movements every morning to having to per morning. She continues to complain of crampy lower abdominal pain. She also reports a 2 week history of dysuria, urgency, and frequency.  Past Medical History  Diagnosis Date  . Anxiety   . Depression   . 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 Hypertension   . Insomnia   . Peripheral neuropathy     no lumbar radiculopathy  . A-fib   . Gallstones   . Neuropathy   . Arrhythmia     afib  . Arthritis   . Colon polyps     due again 04/2017   Past Surgical History  Procedure Laterality Date  . Abdominal hysterectomy    . Cholecystectomy N/A 01/12/2014    Procedure: LAPAROSCOPIC CHOLECYSTECTOMY ;  Surgeon: Coralie Keens, MD;  Location: [facility];  Service: General;  Laterality: N/A;   Current Outpatient Prescriptions on File Prior to Visit  Medication Sig Dispense Refill  . amLODipine (NORVASC) 5 MG tablet Take 5 mg by mouth daily.    Marland Kitchen apixaban (ELIQUIS) 5 MG TABS tablet Take 5 mg by mouth 2 (two) times daily.    . diazepam (VALIUM) 5 MG tablet TAKE 1 TABLET BY MOUTH TWICE A DAY AS NEEDED **MUST LAST 30 DAYS** 60 tablet 2  . fenofibrate (TRICOR) 145 MG tablet Take 145 mg by mouth daily.    Marland Kitchen gabapentin (NEURONTIN) 600 MG tablet Take 600 mg by mouth 2 (two) times daily.    . hydrochlorothiazide (HYDRODIURIL) 25 MG tablet Take 25 mg by mouth daily.    Marland Kitchen HYDROcodone-acetaminophen  (NORCO/VICODIN) 5-325 MG per tablet Take 1 tablet by mouth 2 (two) times daily. DO NOT FILL UNTIL 10/13/14 60 tablet 0  . hydrOXYzine (ATARAX/VISTARIL) 25 MG tablet TAKE 1 TABLET BY MOUTH EVERY NIGHT AT BEDTIME. ** STOP TAKING TRAZODONE** 30 tablet 1  . metoprolol tartrate (LOPRESSOR) 25 MG tablet Take 25 mg by mouth 2 (two) times daily.    . potassium chloride SA (K-DUR,KLOR-CON) 20 MEQ tablet Take 1 tablet (20 mEq total) by mouth daily. 7 tablet 0  . pyridOXINE (VITAMIN B-6) 100 MG tablet Take 100 mg by mouth daily.    . simvastatin (ZOCOR) 20 MG tablet TAKE 1 TABLET BY MOUTH AT BEDTIME 90 tablet 0  . vitamin E 400 UNIT capsule Take 400 Units by  mouth daily.     No current facility-administered medications on file prior to visit.   Allergies  Allergen Reactions  . Paxil [Paroxetine Hcl]     Can't sleep   History   Social History  . Marital Status: Widowed    Spouse Name: N/A  . Number of Children: N/A  . Years of Education: N/A   Occupational History  . Not on file.   Social History Main Topics  . 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 Smokeless tobacco: Current User  . Alcohol Use: No  . Drug Use: No  . Sexual Activity: Not on file   Other Topics Concern  . Not on file   Social History Narrative   No family history on file.    Review of Systems  Gastrointestinal: Positive for diarrhea.  All other systems reviewed and are negative.      Objective:   Physical Exam  Constitutional: She is oriented to person, place, and time. She appears well-developed and well-nourished. No distress.  HENT:  Right Ear: External ear normal.  Left Ear: External ear normal.  Nose: Nose normal.  Mouth/Throat: Oropharynx is clear and moist. No oropharyngeal exudate.  Eyes: Conjunctivae are normal. Right eye exhibits no discharge. Left eye exhibits no discharge. No scleral icterus.  Neck: Normal range of motion. Neck supple. No  JVD present. No thyromegaly present.  Cardiovascular: Normal rate and normal heart sounds.  An irregularly irregular rhythm present.  Pulmonary/Chest: Effort normal and breath sounds normal. No respiratory distress. She has no wheezes. She has no rales. She exhibits no tenderness.  Abdominal: Soft. Bowel sounds are normal. She exhibits distension. She exhibits no mass. There is tenderness. There is guarding. There is no rebound.  Lymphadenopathy:    She has no cervical adenopathy.  Neurological: She is alert and oriented to person, place, and time. She has normal reflexes. No cranial nerve deficit. She exhibits normal muscle tone.  Skin: No rash noted. She is not diaphoretic. No erythema.  Vitals reviewed.           Assessment & Plan:  Burning with urination - Plan: Urinalysis, Routine w reflex microscopic  Chronic diarrhea - Plan: Ambulatory referral to Gastroenterology  I believe the patient mainly has irritable bowel syndrome. However at this point I've tried everything I note a try. Given the expense of xifaxin and viberzi, I will consult a GI for a second opinion.  Urinalysis reveals blood and leukocyte esterase. I will send the urine for culture but it appears as though the patient may have a urinary tract infection. I will treat this with Cipro 500 mg by mouth twice a day for 5 days.

## 2014-09-27 NOTE — Addendum Note (Signed)
Addended by: Haskel Khan A on: 09/27/2014 04:22 PM   Modules accepted: Orders

## 2014-09-29 LAB — URINE CULTURE

## 2014-10-01 ENCOUNTER — Telehealth: Payer: Self-pay | Admitting: Family Medicine

## 2014-10-01 ENCOUNTER — Other Ambulatory Visit: Payer: Self-pay | Admitting: Family Medicine

## 2014-10-01 MED ORDER — APIXABAN 5 MG PO TABS: 5.0000 mg | ORAL_TABLET | Freq: Two times a day (BID) | ORAL | Status: DC

## 2014-10-01 NOTE — Telephone Encounter (Signed)
Received faxed documents form [place] at [facility] with paperwork for [place] to fill out for her patient assistance for Eliquis.  Paperwork filled out and faxed to [place] at [phone] along with rx for #180/4 refills

## 2014-10-13 ENCOUNTER — Telehealth: Payer: Self-pay | Admitting: Family Medicine

## 2014-10-13 MED ORDER — APIXABAN 5 MG PO TABS: 5.0000 mg | ORAL_TABLET | Freq: Two times a day (BID) | ORAL | Status: DC

## 2014-10-13 NOTE — Telephone Encounter (Signed)
PA submitted through [email or web address]  

## 2014-10-13 NOTE — Telephone Encounter (Signed)
Approved through 10/13/15 - pharmacy aware

## 2014-10-27 ENCOUNTER — Other Ambulatory Visit: Payer: Self-pay | Admitting: Family Medicine

## 2014-10-27 NOTE — Telephone Encounter (Signed)
Medication refilled

## 2014-10-29 ENCOUNTER — Other Ambulatory Visit: Payer: Self-pay | Admitting: *Deleted

## 2014-10-29 MED ORDER — SIMVASTATIN 20 MG PO TABS: 20.0000 mg | ORAL_TABLET | Freq: Every day | ORAL | Status: DC

## 2014-11-04 ENCOUNTER — Other Ambulatory Visit: Payer: Self-pay | Admitting: Family Medicine

## 2014-11-04 NOTE — Telephone Encounter (Signed)
LRF 06/21/14 #60 + 2.  LOV 09/27/14  OK refill?

## 2014-11-04 NOTE — Telephone Encounter (Signed)
ok 

## 2014-11-05 NOTE — Telephone Encounter (Signed)
Medication refilled per protocol. 

## 2014-11-07 ENCOUNTER — Other Ambulatory Visit: Payer: Self-pay | Admitting: Family Medicine

## 2014-11-11 DIAGNOSIS — R1084 Generalized abdominal pain: Secondary | ICD-10-CM | POA: Diagnosis not present

## 2014-11-11 DIAGNOSIS — R197 Diarrhea, unspecified: Secondary | ICD-10-CM | POA: Diagnosis not present

## 2014-11-11 DIAGNOSIS — G8929 Other chronic pain: Secondary | ICD-10-CM | POA: Diagnosis not present

## 2014-11-22 DIAGNOSIS — G8929 Other chronic pain: Secondary | ICD-10-CM | POA: Diagnosis not present

## 2014-11-22 DIAGNOSIS — R1084 Generalized abdominal pain: Secondary | ICD-10-CM | POA: Diagnosis not present

## 2014-11-22 DIAGNOSIS — R197 Diarrhea, unspecified: Secondary | ICD-10-CM | POA: Diagnosis not present

## 2014-12-09 ENCOUNTER — Telehealth: Payer: Self-pay | Admitting: Family Medicine

## 2014-12-09 MED ORDER — HYDROCODONE-ACETAMINOPHEN 5-325 MG PO TABS: 1.0000 | ORAL_TABLET | Freq: Two times a day (BID) | ORAL | Status: DC

## 2014-12-09 NOTE — Telephone Encounter (Signed)
RX printed, left up front and patient aware to pick up via vm 

## 2014-12-09 NOTE — Telephone Encounter (Signed)
Patient's daughter called states it is time for her to pick up the prescription for 3 refills on HYDROcodone-acetaminophen.  Please advise when ready.

## 2014-12-09 NOTE — Telephone Encounter (Signed)
ok 

## 2014-12-09 NOTE — Telephone Encounter (Signed)
?   OK to Refill  

## 2015-01-03 ENCOUNTER — Other Ambulatory Visit: Payer: Self-pay | Admitting: Family Medicine

## 2015-01-26 ENCOUNTER — Other Ambulatory Visit: Payer: Self-pay | Admitting: Family Medicine

## 2015-02-01 ENCOUNTER — Other Ambulatory Visit: Payer: Self-pay | Admitting: Cardiovascular Disease

## 2015-02-01 ENCOUNTER — Other Ambulatory Visit: Payer: Self-pay | Admitting: Family Medicine

## 2015-02-01 MED ORDER — HYDROXYZINE HCL 25 MG PO TABS: 25.0000 mg | ORAL_TABLET | Freq: Every day | ORAL | Status: DC

## 2015-02-01 NOTE — Addendum Note (Signed)
Addended by: Olena Mater on: 02/01/2015 04:10 PM   Modules accepted: Orders

## 2015-02-01 NOTE — Telephone Encounter (Signed)
Medication refilled per protocol. 

## 2015-02-21 ENCOUNTER — Ambulatory Visit (INDEPENDENT_AMBULATORY_CARE_PROVIDER_SITE_OTHER): Payer: Medicare Other | Admitting: Family Medicine

## 2015-02-21 ENCOUNTER — Encounter: Payer: Self-pay | Admitting: Family Medicine

## 2015-02-21 VITALS — BP 116/72 | HR 70 | Temp 98.3°F | Resp 16 | Wt 195.0 lb

## 2015-02-21 DIAGNOSIS — I480 Paroxysmal atrial fibrillation: Secondary | ICD-10-CM

## 2015-02-21 DIAGNOSIS — E781 Pure hyperglyceridemia: Secondary | ICD-10-CM

## 2015-02-21 DIAGNOSIS — Z0000 Encounter for general adult medical examination without abnormal findings: Secondary | ICD-10-CM

## 2015-02-21 DIAGNOSIS — G629 Polyneuropathy, unspecified: Secondary | ICD-10-CM | POA: Diagnosis not present

## 2015-02-21 NOTE — Progress Notes (Signed)
Subjective:    Patient ID: Catherine Orozco, female    DOB: 11-03-1940, 74 y.o.   MRN: [id number]  HPI Patient is here for CPE.  Her last colonosocpy was in 2015 and was normal.  She does not require a Pap smear as she has had a hysterectomy. She is due for a mammogram as well as a bone density test. She refuses both of these. She is due for a flu shot, Pneumovax 23, Prevnar 13, tetanus shot. She is willing to receive Pneumovax 23 today but she declines the rest of the shots. Labs were checked in February. CBC and CMP were relatively normal. Fasting lipid panel showed triglycerides greater than 500 and HDL cholesterol less than 40. She is here today to recheck her cholesterol. Past Medical History  Diagnosis Date  . Anxiety   . Depression   . 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 Hypertension   . Insomnia   . Peripheral neuropathy (HCC)     no lumbar radiculopathy  . A-fib ([place])   . Gallstones   . Neuropathy ([facility])   . Arrhythmia     afib  . Arthritis   . Colon polyps     due again 04/2017   Past Surgical History  Procedure Laterality Date  . Abdominal hysterectomy    . Cholecystectomy N/A 01/12/2014    Procedure: LAPAROSCOPIC CHOLECYSTECTOMY ;  Surgeon: Coralie Keens, MD;  Location: [facility];  Service: General;  Laterality: N/A;   Current Outpatient Prescriptions on File Prior to Visit  Medication Sig Dispense Refill  . amLODipine (NORVASC) 5 MG tablet Take 5 mg by mouth daily.    Marland Kitchen apixaban (ELIQUIS) 5 MG TABS tablet Take 1 tablet (5 mg total) by mouth 2 (two) times daily. 180 tablet 4  . diazepam (VALIUM) 5 MG tablet TAKE 1 TABLET BY MOUTH TWICE A DAY **REFILLS MUST BE 30 DAYS APART** 60 tablet 2  . Eluxadoline 100 MG TABS Take 100 mg by mouth 2 (two) times daily.    . fenofibrate (TRICOR) 145 MG tablet Take 145 mg by mouth daily.    Marland Kitchen gabapentin (NEURONTIN) 600 MG tablet Take 600 mg by mouth 2 (two) times daily.    . hydrochlorothiazide (HYDRODIURIL) 25 MG tablet Take 25  mg by mouth daily.    Marland Kitchen HYDROcodone-acetaminophen (NORCO/VICODIN) 5-325 MG per tablet Take 1 tablet by mouth 2 (two) times daily. DO NOT FILL UNTIL 02/12/15 60 tablet 0  . hydrOXYzine (ATARAX/VISTARIL) 25 MG tablet Take 1 tablet (25 mg total) by mouth at bedtime. 30 tablet 1  . metoprolol tartrate (LOPRESSOR) 25 MG tablet Take 25 mg by mouth 2 (two) times daily.    . metoprolol tartrate (LOPRESSOR) 25 MG tablet TAKE 1 TABLET BY MOUTH TWICE A DAY 180 tablet 3  . potassium chloride SA (K-DUR,KLOR-CON) 20 MEQ tablet Take 1 tablet (20 mEq total) by mouth daily. 7 tablet 0  . pyridOXINE (VITAMIN B-6) 100 MG tablet Take 100 mg by mouth daily.    . simvastatin (ZOCOR) 20 MG tablet Take 1 tablet (20 mg total) by mouth at bedtime. 90 tablet 3  . vitamin E 400 UNIT capsule Take 400 Units by mouth daily.     No current facility-administered medications on file prior to visit.   Allergies  Allergen Reactions  . Paxil [Paroxetine Hcl]     Can't sleep   Social History   Social History  . Marital Status: Widowed    Spouse  Name: N/A  . Number of Children: N/A  . Years of Education: N/A   Occupational History  . Not on file.   Social History Main Topics  . 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 Smokeless tobacco: Current User  . Alcohol Use: No  . Drug Use: No  . Sexual Activity: Not on file   Other Topics Concern  . Not on file   Social History Narrative   No family history on file.    Review of Systems  All other systems reviewed and are negative.      Objective:   Physical Exam  Constitutional: She is oriented to person, place, and time. She appears well-developed and well-nourished. No distress.  HENT:  Head: Normocephalic and atraumatic.  Right Ear: External ear normal.  Left Ear: External ear normal.  Nose: Nose normal.  Mouth/Throat: Oropharynx is clear and moist. No oropharyngeal exudate.  Eyes: Conjunctivae and  EOM are normal. Pupils are equal, round, and reactive to light. Right eye exhibits no discharge. Left eye exhibits no discharge. No scleral icterus.  Neck: Normal range of motion. Neck supple. No JVD present. No tracheal deviation present. No thyromegaly present.  Cardiovascular: Normal rate, regular rhythm, normal heart sounds and intact distal pulses.  Exam reveals no gallop and no friction rub.   No murmur heard. Pulmonary/Chest: Effort normal and breath sounds normal. No stridor. No respiratory distress. She has no wheezes. She has no rales. She exhibits no tenderness.  Abdominal: Soft. Bowel sounds are normal. She exhibits no distension and no mass. There is no tenderness. There is no rebound and no guarding.  Musculoskeletal: Normal range of motion. She exhibits no edema or tenderness.  Lymphadenopathy:    She has no cervical adenopathy.  Neurological: She is alert and oriented to person, place, and time. She has normal reflexes. She displays normal reflexes. No cranial nerve deficit. She exhibits normal muscle tone. Coordination normal.  Skin: Skin is warm. No rash noted. She is not diaphoretic. No erythema. No pallor.  Psychiatric: She has a normal mood and affect. Her behavior is normal. Judgment and thought content normal.  Vitals reviewed.         Assessment & Plan:  Routine general medical examination at a health care facility  Hypertriglyceridemia - Plan: COMPLETE METABOLIC PANEL WITH GFR, Lipid panel  Paroxysmal atrial fibrillation (HCC)  Neuropathy (HCC)  I will check a fasting lipid panel. Goal LDL cholesterol is less than 100. Goal HDL cholesterol is greater than 40. Goal triglycerides are less than 250. I will also monitor CMP to evaluate for elevations in her liver function test. Her CBC has been checked within the last year. She refuses a flu shot. She refuses a mammogram. She refuses a bone density. She will consent to Pneumovax 23. She has no desire to quit smoking.  I recommended smoking cessation however

## 2015-02-22 LAB — COMPLETE METABOLIC PANEL WITH GFR
ALT: 24 U/L (ref 6–29)
AST: 36 U/L — ABNORMAL HIGH (ref 10–35)
Albumin: 4.3 g/dL (ref 3.6–5.1)
Alkaline Phosphatase: 86 U/L (ref 33–130)
BUN: 12 mg/dL (ref 7–25)
CALCIUM: 9.5 mg/dL (ref 8.6–10.4)
CHLORIDE: 96 mmol/L — AB (ref 98–110)
CO2: 27 mmol/L (ref 20–31)
Creat: 0.91 mg/dL (ref 0.60–0.93)
GFR, EST AFRICAN AMERICAN: 72 mL/min (ref >=60)
GFR, EST NON AFRICAN AMERICAN: 63 mL/min (ref >=60)
Glucose, Bld: 102 mg/dL — ABNORMAL HIGH (ref 70–99)
POTASSIUM: 3.7 mmol/L (ref 3.5–5.3)
Sodium: 133 mmol/L — ABNORMAL LOW (ref 135–146)
Total Bilirubin: 0.5 mg/dL (ref 0.2–1.2)
Total Protein: 6.4 g/dL (ref 6.1–8.1)

## 2015-02-22 LAB — LIPID PANEL
CHOL/HDL RATIO: 3.8 ratio (ref <=5.0)
CHOLESTEROL: 140 mg/dL (ref 125–200)
HDL: 37 mg/dL — AB (ref >=46)
TRIGLYCERIDES: 533 mg/dL — AB (ref <150)

## 2015-02-23 ENCOUNTER — Other Ambulatory Visit: Payer: Self-pay | Admitting: Family Medicine

## 2015-02-23 MED ORDER — FENOFIBRATE 145 MG PO TABS: 145.0000 mg | ORAL_TABLET | Freq: Every day | ORAL | Status: DC

## 2015-02-23 MED ORDER — SIMVASTATIN 20 MG PO TABS: 20.0000 mg | ORAL_TABLET | Freq: Every day | ORAL | Status: DC

## 2015-02-25 ENCOUNTER — Other Ambulatory Visit: Payer: Self-pay | Admitting: Family Medicine

## 2015-02-25 MED ORDER — GABAPENTIN 600 MG PO TABS: 600.0000 mg | ORAL_TABLET | Freq: Two times a day (BID) | ORAL | Status: DC

## 2015-02-25 MED ORDER — HYDROCHLOROTHIAZIDE 25 MG PO TABS: 25.0000 mg | ORAL_TABLET | Freq: Every day | ORAL | Status: DC

## 2015-02-25 NOTE — Telephone Encounter (Signed)
Medication refilled per protocol. 

## 2015-03-01 ENCOUNTER — Other Ambulatory Visit: Payer: Self-pay | Admitting: Family Medicine

## 2015-03-01 NOTE — Telephone Encounter (Signed)
ok 

## 2015-03-01 NOTE — Telephone Encounter (Signed)
Patient calling to get her 3 month supply of her rx for her hydrocodone  [phone]

## 2015-03-01 NOTE — Telephone Encounter (Signed)
?   OK to Refill x 3months 

## 2015-03-04 MED ORDER — HYDROCODONE-ACETAMINOPHEN 5-325 MG PO TABS: 1.0000 | ORAL_TABLET | Freq: Two times a day (BID) | ORAL | Status: DC

## 2015-03-04 NOTE — Telephone Encounter (Signed)
Rx's for 03/15/15, 04/14/15, 05/15/15 printed

## 2015-03-14 ENCOUNTER — Telehealth: Payer: Self-pay | Admitting: *Deleted

## 2015-03-14 NOTE — Telephone Encounter (Signed)
Pt daughter called stating that pt has run out of her pain medication for this month d/t it falling on the 31st and her prescription was written for month of Oct for only 30 days instead of 31 and wants to know if you could override prescription for her to have the xtra pill for Oct today. Daughter states that her next prescription if to be filled on Nov 1. And wanted to get the extra pill for that as well. (pt was confusing).   Can you override for pt to have one pill?  Please advise.

## 2015-03-31 ENCOUNTER — Other Ambulatory Visit: Payer: Self-pay | Admitting: Family Medicine

## 2015-03-31 NOTE — Telephone Encounter (Signed)
Approved for #60+2 

## 2015-03-31 NOTE — Telephone Encounter (Signed)
Medication called to pharmacy. 

## 2015-03-31 NOTE — Telephone Encounter (Signed)
LRF 11/05/14 #60 + 2  LOV 02/21/15  OK refill?

## 2015-04-02 IMAGING — CT CT ABD-PELV W/ CM
2 of 5 series · 17 of 46 positions shown, 19 images · IV contrast (isovue)
Comparison: 11/08/2010

CLINICAL DATA: Generalized abdominal pain for 1 year with
persistent diarrhea for 1 month

EXAM:
CT ABDOMEN AND PELVIS WITH CONTRAST
TECHNIQUE: Multidetector CT imaging of the abdomen and pelvis was performed
using the standard protocol following bolus administration of
intravenous contrast.
CONTRAST:  100 mL Isovue 300

[Series 2: routine with · axial · 0.76mm/px · z∈[-1054,-614]mm · 14 of 98 slices shown, 16 images]
[im 5/98  soft-tissue]
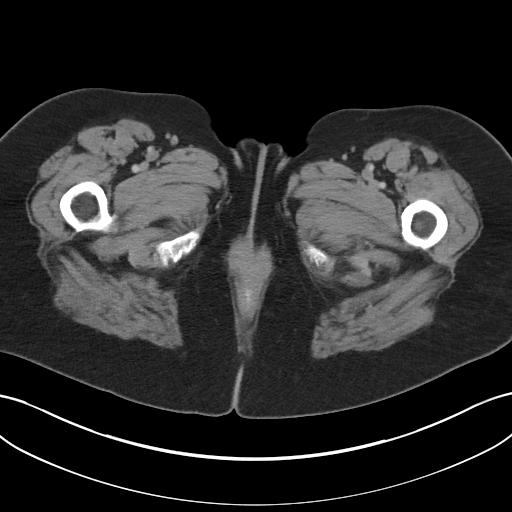
[im 5/98  bone]
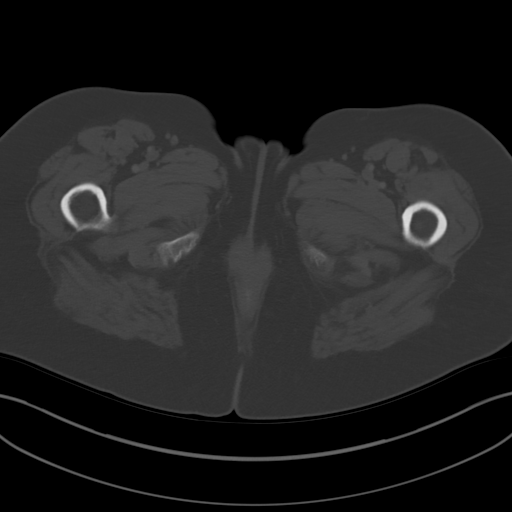
[im 15/98  soft-tissue]
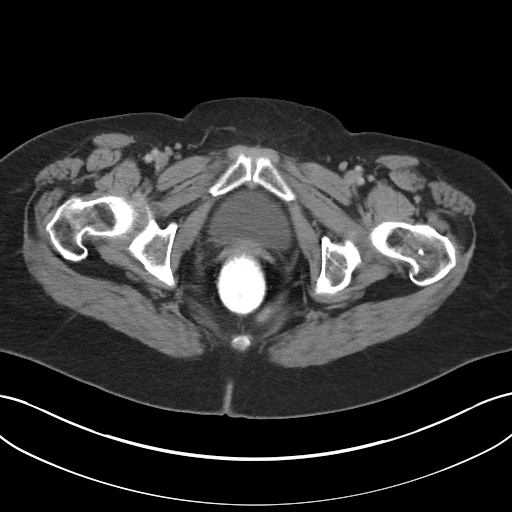
[im 20/98  soft-tissue]
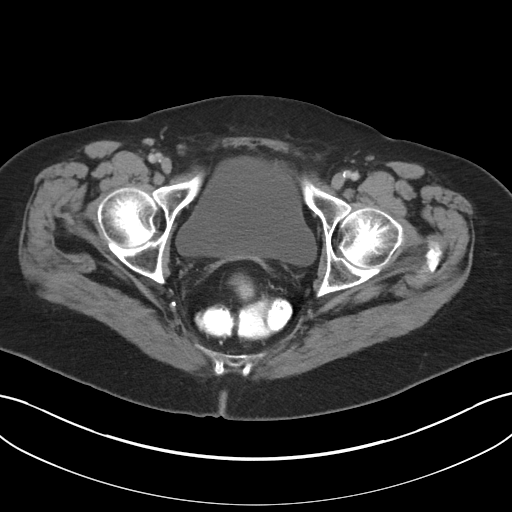
[im 25/98  soft-tissue]
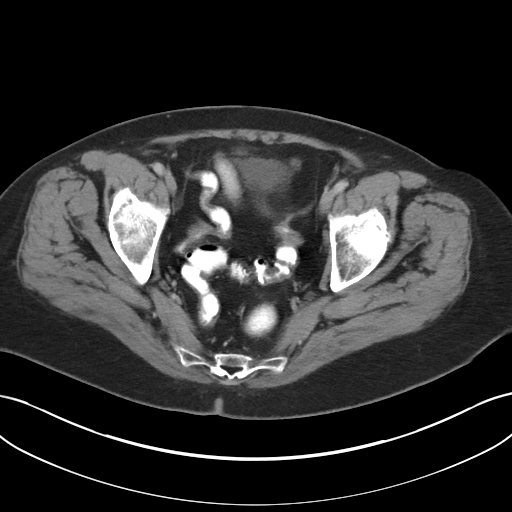
[im 34/98  soft-tissue]
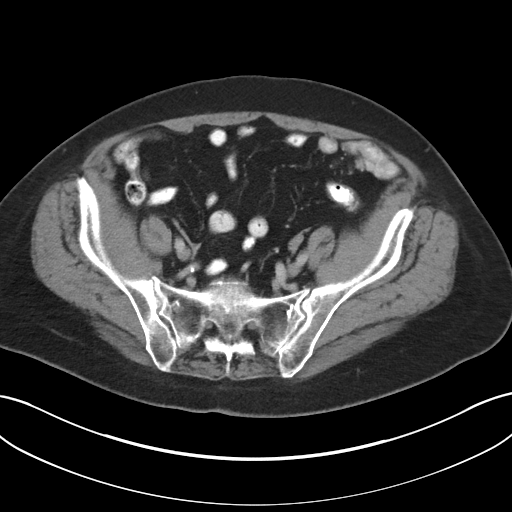
[im 39/98  soft-tissue]
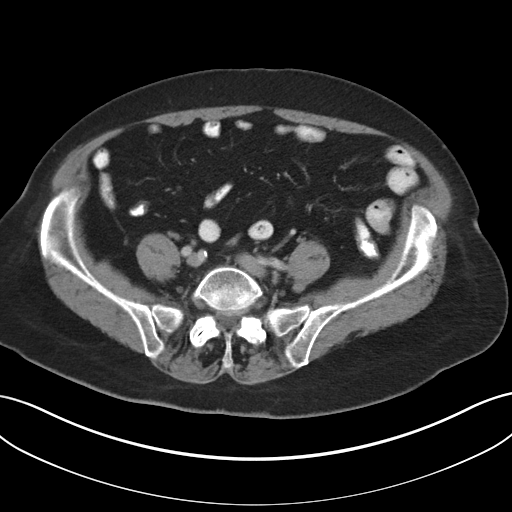
[im 44/98  soft-tissue]
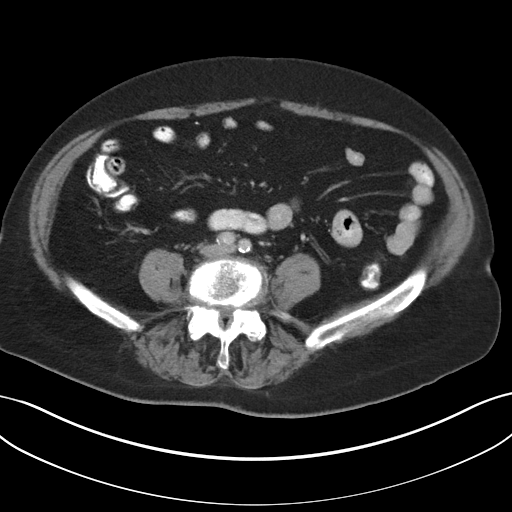
[im 54/98  soft-tissue]
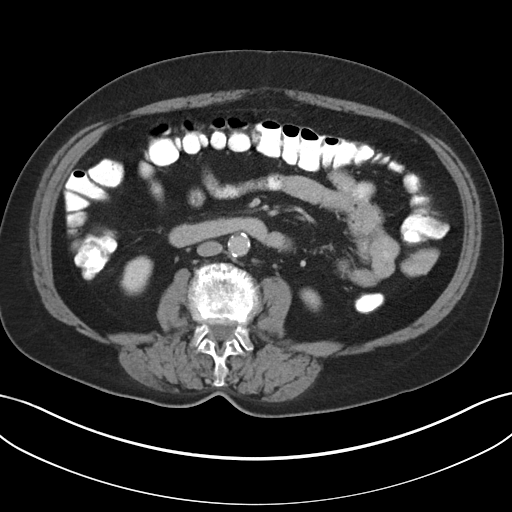
[im 59/98  soft-tissue]
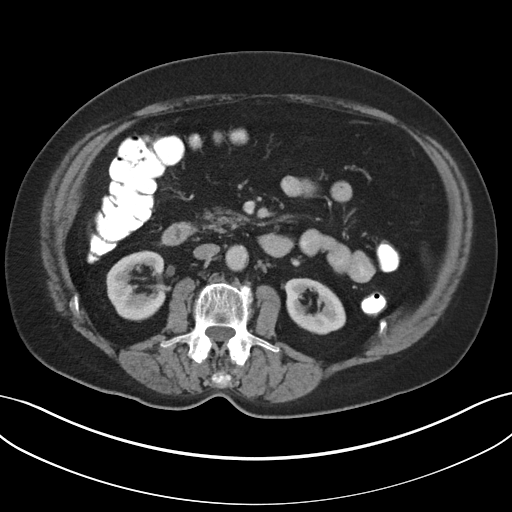
[im 59/98  bone]
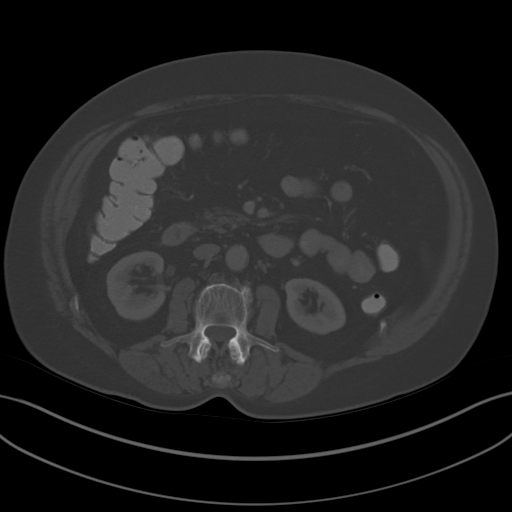
[im 64/98  soft-tissue]
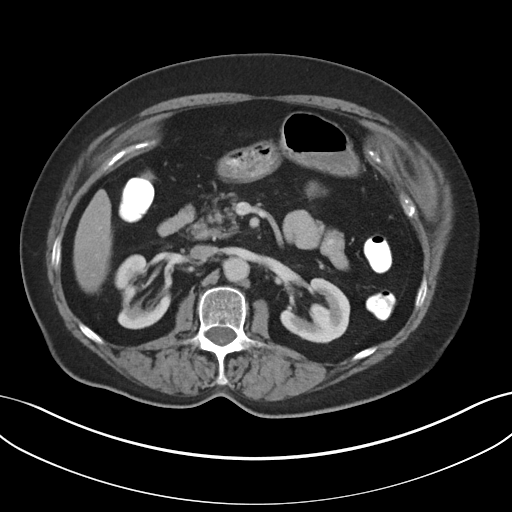
[im 73/98  soft-tissue]
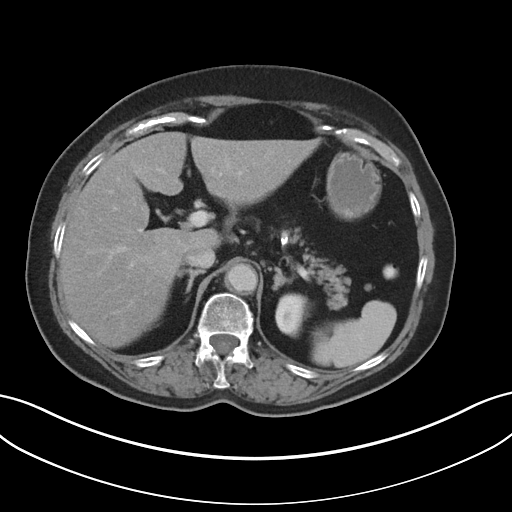
[im 78/98  soft-tissue]
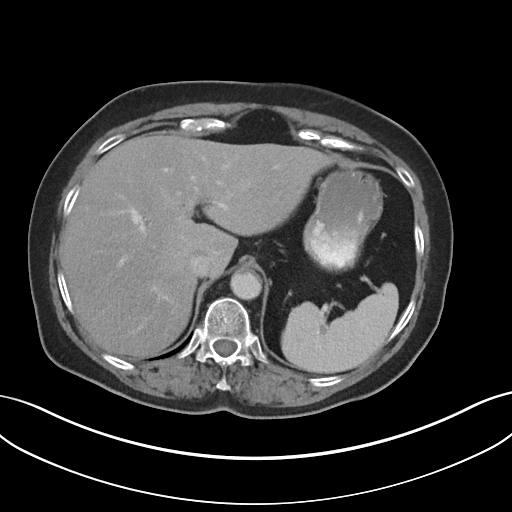
[im 83/98  soft-tissue]
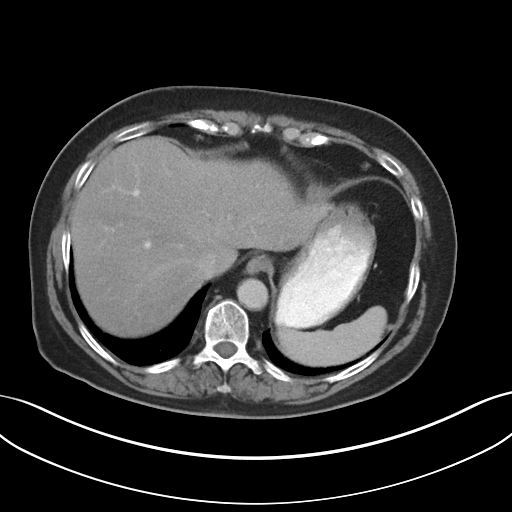
[im 93/98  soft-tissue]
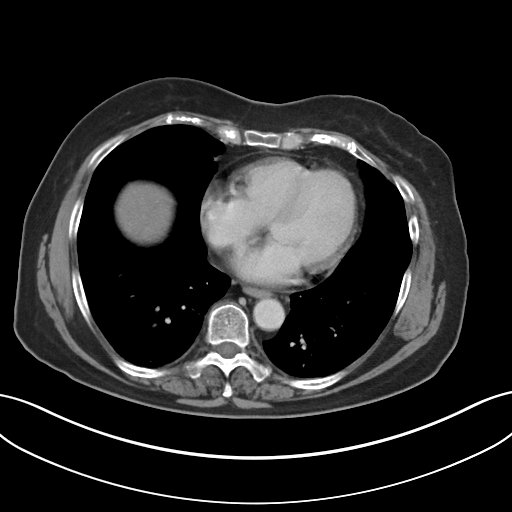

[Series 6: cor routine with · coronal · 0.86mm/px · 3 of 160 slices shown]
[im 54/160  soft-tissue]
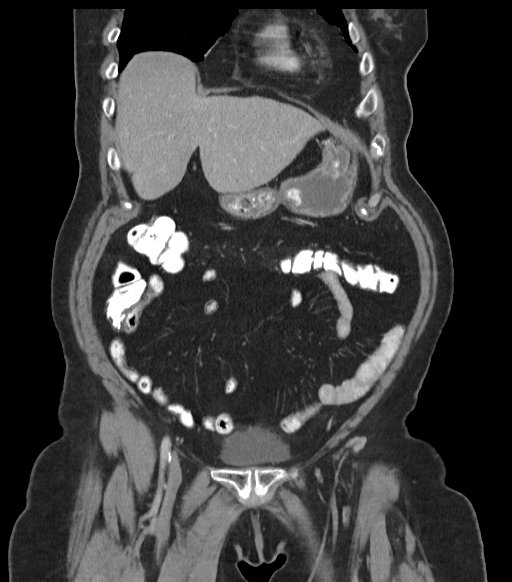
[im 71/160  soft-tissue]
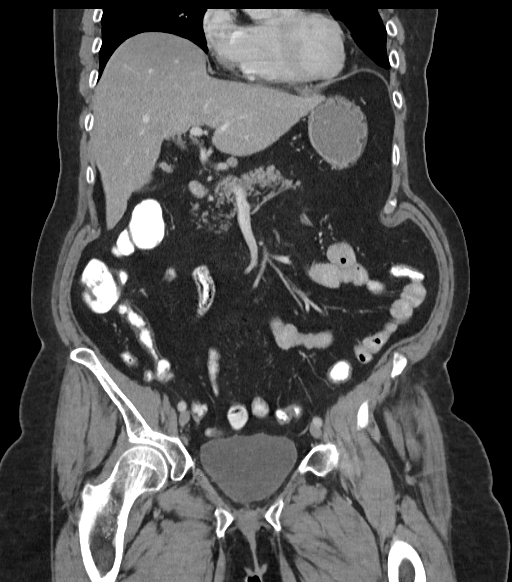
[im 89/160  soft-tissue]
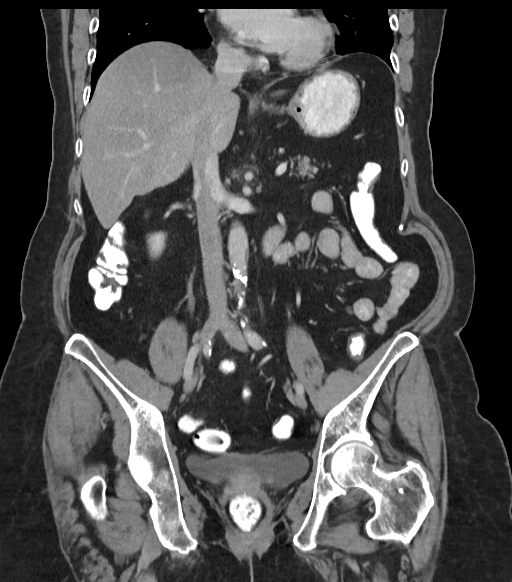

[17 of 46 positions shown; findings below may reference images not displayed]

FINDINGS: Visualized lung bases clear. No acute musculoskeletal abnormalities.

Mild hepatic steatosis with no focal hepatic abnormalities. There is
gallbladder wall enhancement. There is mild gallbladder wall
thickening and mild inflammatory change is seen around the
gallbladder. No gallstones identified.

Spleen and pancreas are normal. Adrenal glands are normal. Left
kidney normal. Upper pole right kidney shows significant focal
cortical thickening laterally suggesting scarring, new from prior
study. No evidence of renal mass or hydronephrosis.

Calcification of the aorta without dilatation. Bowel is normal. No
ascites or adenopathy. Reproductive organs appear to be surgically
absent. Bladder is normal.
IMPRESSION: The findings suggest inflammation involving the gallbladder.
Consider right upper quadrant ultrasound to further evaluate.

## 2015-04-26 ENCOUNTER — Other Ambulatory Visit: Payer: Self-pay | Admitting: Family Medicine

## 2015-04-26 NOTE — Telephone Encounter (Signed)
Medication refilled per protocol. 

## 2015-05-18 ENCOUNTER — Telehealth: Payer: Self-pay | Admitting: Family Medicine

## 2015-05-18 MED ORDER — APIXABAN 5 MG PO TABS: 5.0000 mg | ORAL_TABLET | Freq: Two times a day (BID) | ORAL | Status: DC

## 2015-05-18 NOTE — Telephone Encounter (Signed)
Patient assistance filled out and faxed to [facility] along with new RX for Eliquis

## 2015-05-18 NOTE — Addendum Note (Signed)
Addended by: Shary Decamp B on: 05/18/2015 10:43 AM   Modules accepted: Orders

## 2015-05-29 IMAGING — CR DG CHEST 2V
2 series · 2 of 2 positions shown · non-contrast
Comparison: Thoracic spine 05/24/2008.

CLINICAL DATA: Laparoscopic cholecystectomy.

EXAM:
CHEST  2 VIEW

[w chest pa]
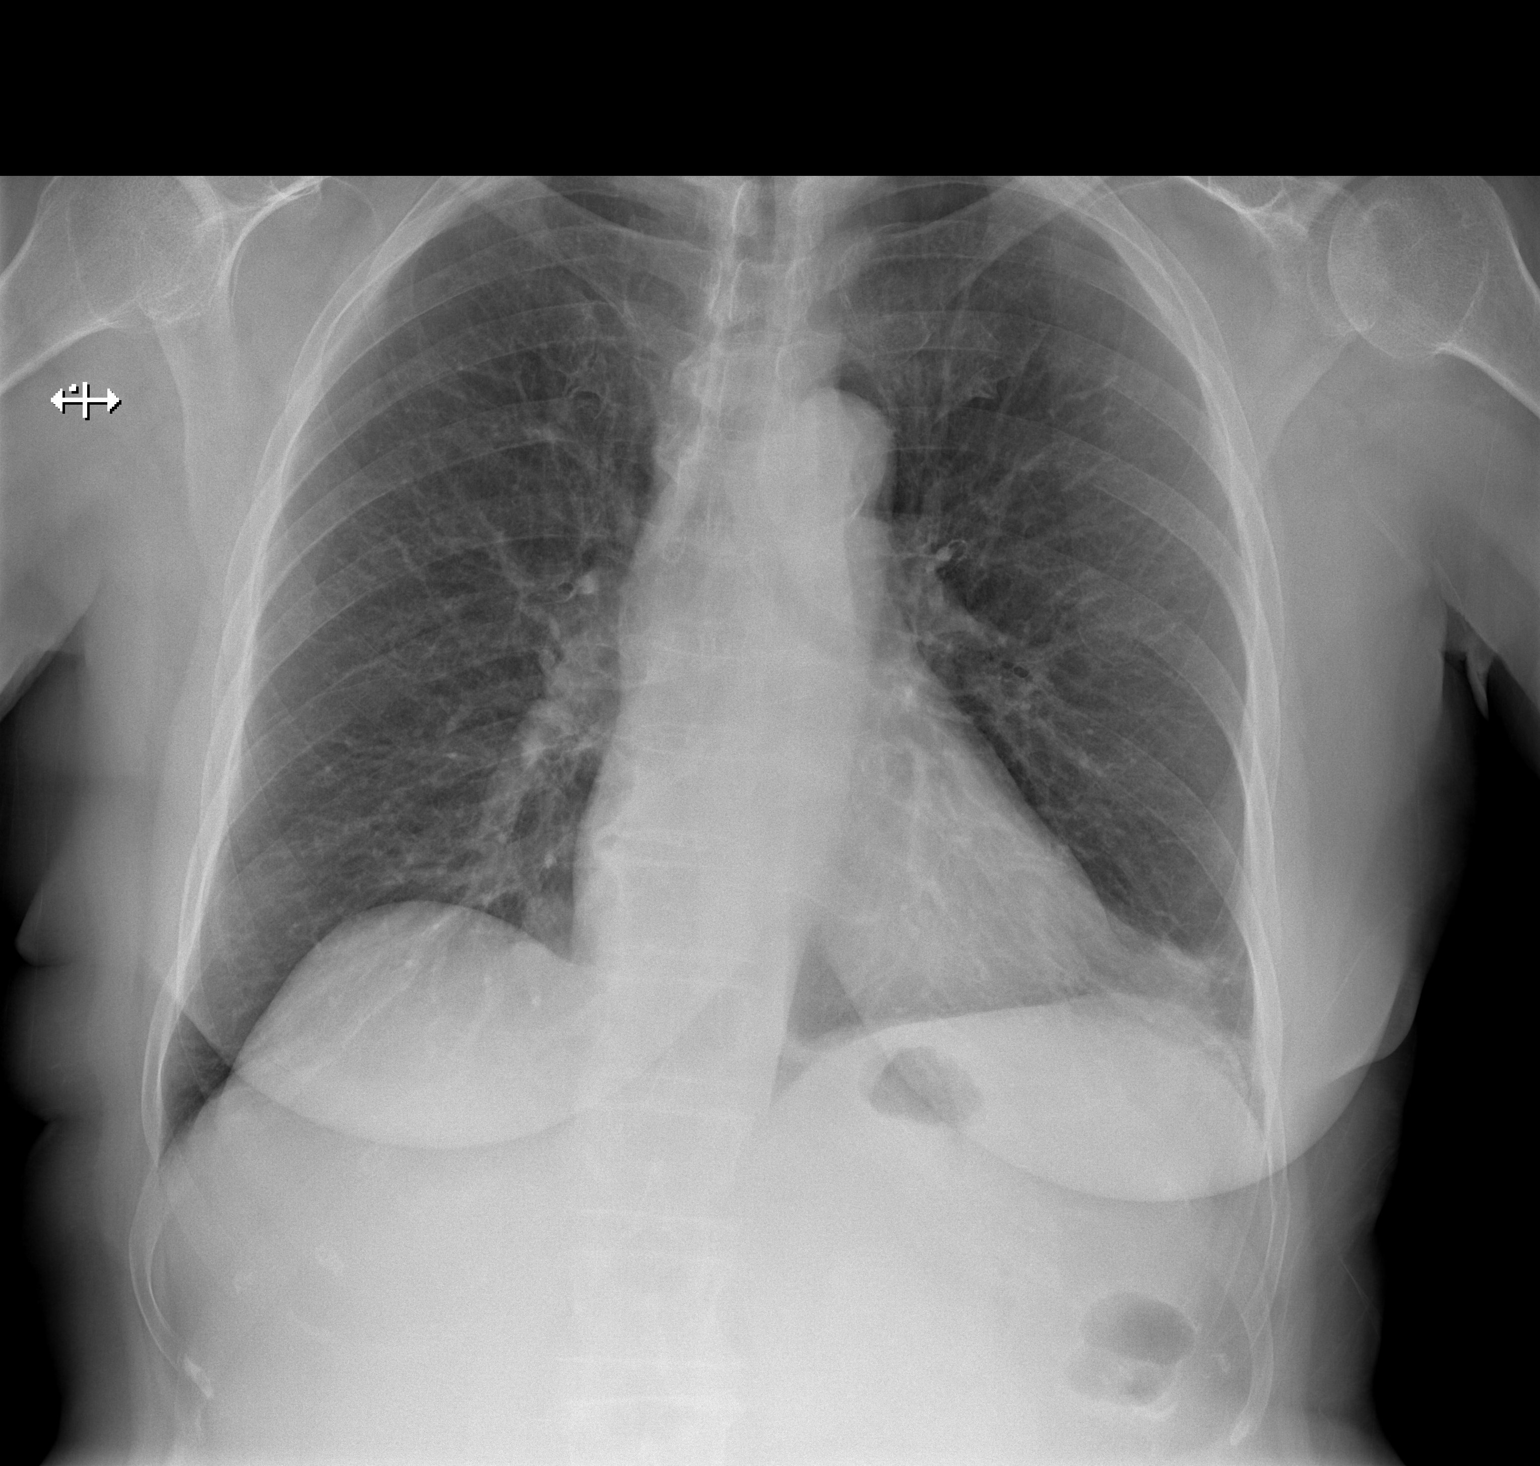

[w chest lat]
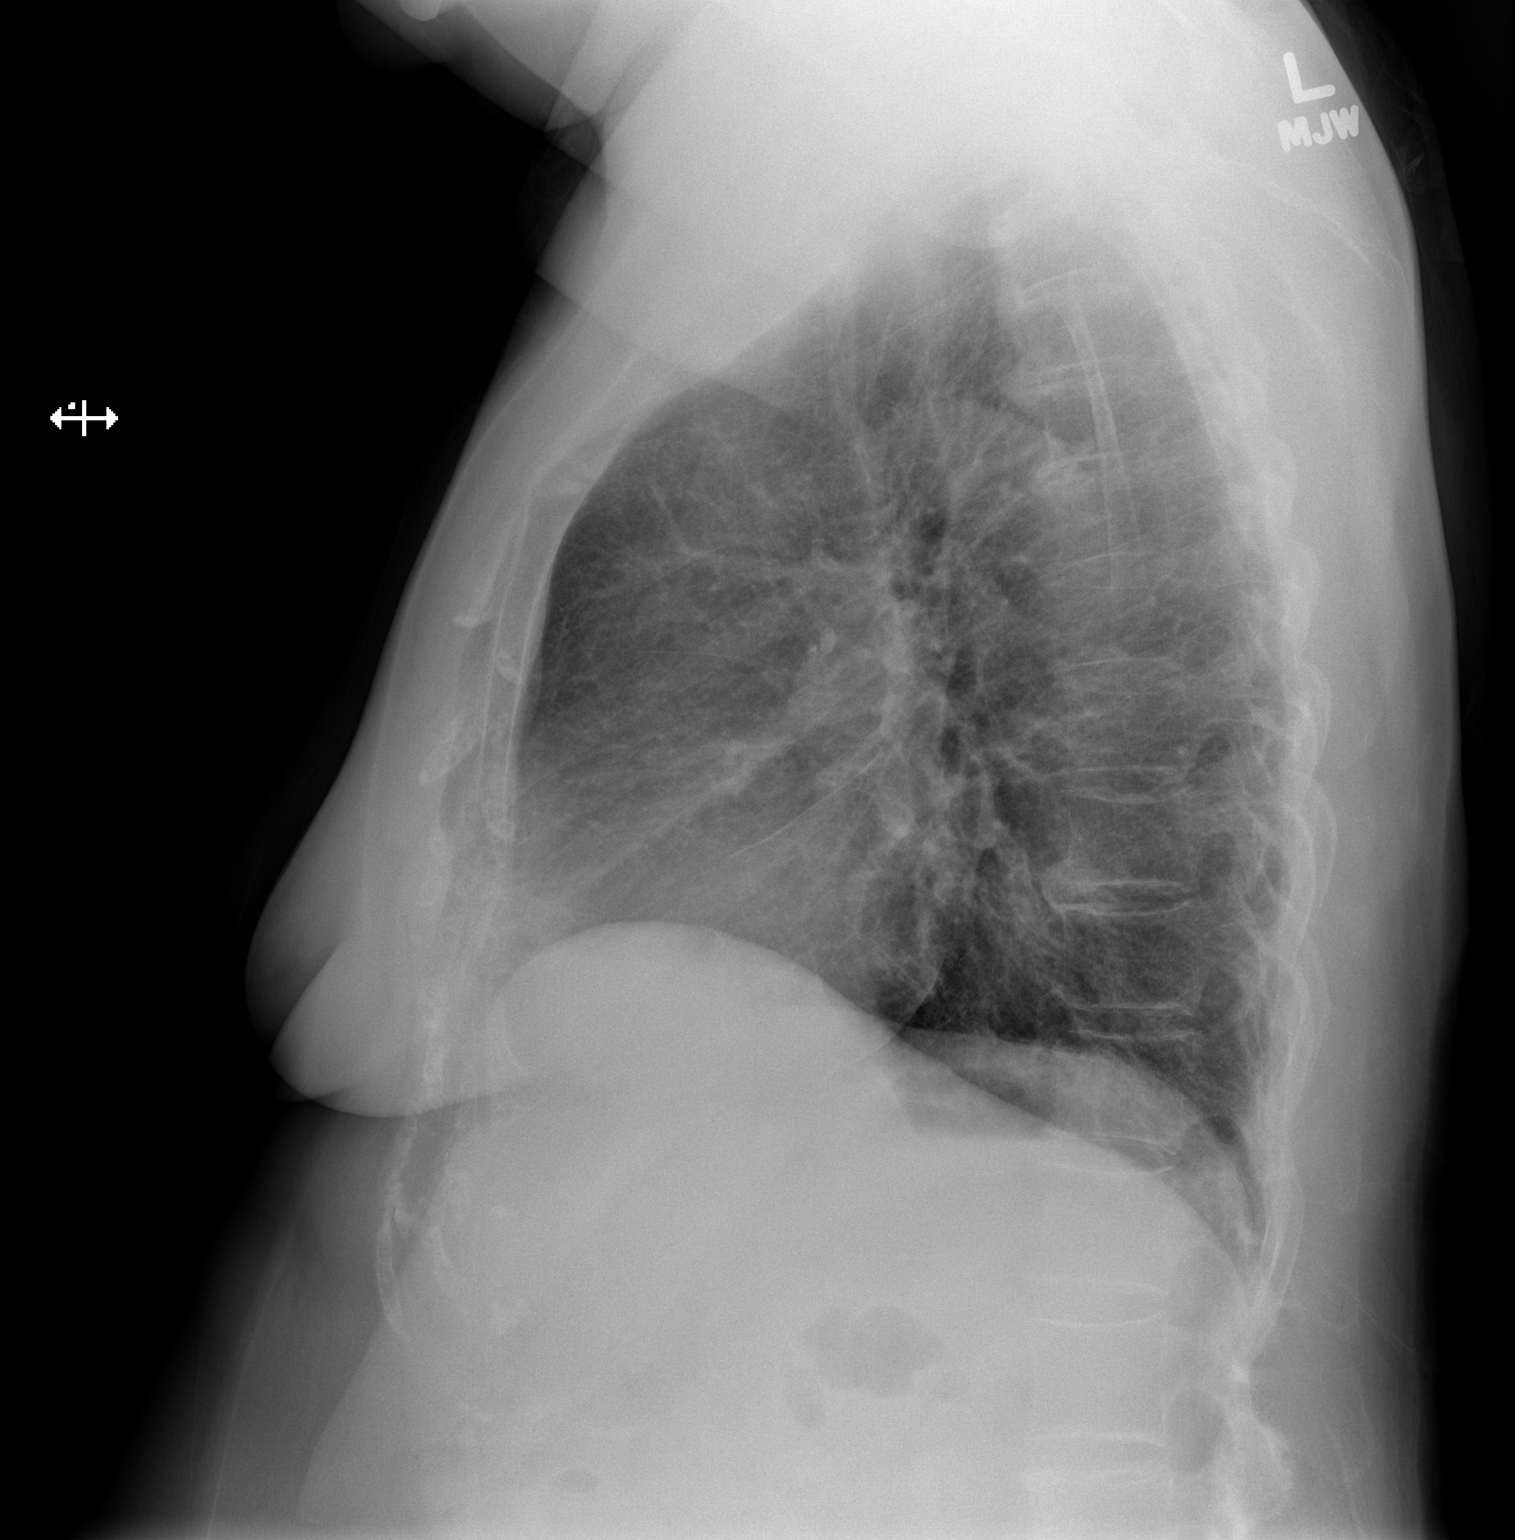

[2 of 2 positions shown; findings below may reference images not displayed]

FINDINGS: Trachea is midline. Heart size normal. Probable scarring in the
lingula. Biapical pleural thickening. A small nodular density is
seen in the medial base of the right hemi thorax and is not well
seen on thoracic spine series performed 05/24/2008. Lungs are
otherwise clear. No pleural fluid.
IMPRESSION: 1. Nodular density in the medial aspect of the lower right hemi
thorax. CT chest without contrast is recommended in further
evaluation, especially in this patient with a significant smoking
history. These results will be called to the ordering clinician or
representative by the Radiologist Assistant, and communication
documented in the PACS or zVision Dashboard.
2. Probable scarring in the lingula.

## 2015-06-01 ENCOUNTER — Other Ambulatory Visit: Payer: Self-pay | Admitting: Family Medicine

## 2015-06-01 MED ORDER — METOPROLOL TARTRATE 25 MG PO TABS: 25.0000 mg | ORAL_TABLET | Freq: Two times a day (BID) | ORAL | Status: DC

## 2015-06-01 NOTE — Telephone Encounter (Signed)
Medication called/sent to requested pharmacy  

## 2015-06-07 ENCOUNTER — Telehealth: Payer: Self-pay | Admitting: Family Medicine

## 2015-06-07 NOTE — Telephone Encounter (Signed)
?   OK to Refill  

## 2015-06-07 NOTE — Telephone Encounter (Signed)
ok 

## 2015-06-07 NOTE — Telephone Encounter (Signed)
Not due until 06/15/15

## 2015-06-07 NOTE — Telephone Encounter (Signed)
Patient need rx for her hydrocodone

## 2015-06-09 ENCOUNTER — Ambulatory Visit (INDEPENDENT_AMBULATORY_CARE_PROVIDER_SITE_OTHER): Payer: Medicare Other | Admitting: Family Medicine

## 2015-06-09 ENCOUNTER — Encounter: Payer: Self-pay | Admitting: Family Medicine

## 2015-06-09 VITALS — BP 108/70 | HR 82 | Temp 98.1°F | Resp 16 | Ht 64.5 in | Wt 196.0 lb

## 2015-06-09 DIAGNOSIS — R300 Dysuria: Secondary | ICD-10-CM

## 2015-06-09 DIAGNOSIS — E781 Pure hyperglyceridemia: Secondary | ICD-10-CM | POA: Diagnosis not present

## 2015-06-09 DIAGNOSIS — I480 Paroxysmal atrial fibrillation: Secondary | ICD-10-CM

## 2015-06-09 DIAGNOSIS — G629 Polyneuropathy, unspecified: Secondary | ICD-10-CM | POA: Diagnosis not present

## 2015-06-09 LAB — URINALYSIS, ROUTINE W REFLEX MICROSCOPIC
Bilirubin Urine: NEGATIVE
Glucose, UA: NEGATIVE
Ketones, ur: NEGATIVE
NITRITE: NEGATIVE
Protein, ur: NEGATIVE
SPECIFIC GRAVITY, URINE: 1.02 (ref 1.001–1.035)
pH: 5.5 (ref 5.0–8.0)

## 2015-06-09 LAB — URINALYSIS, MICROSCOPIC ONLY
CRYSTALS: NONE SEEN [HPF]
Casts: NONE SEEN [LPF]
Yeast: NONE SEEN [HPF]

## 2015-06-09 MED ORDER — HYDROCODONE-ACETAMINOPHEN 5-325 MG PO TABS: 1.0000 | ORAL_TABLET | Freq: Two times a day (BID) | ORAL | Status: DC

## 2015-06-09 NOTE — Addendum Note (Signed)
Addended by: Haskel Khan A on: 06/09/2015 02:36 PM   Modules accepted: Orders

## 2015-06-09 NOTE — Telephone Encounter (Signed)
Rx's ready and pt to p/u at [facility] today

## 2015-06-09 NOTE — Progress Notes (Signed)
Subjective:    Patient ID: Catherine Orozco, female    DOB: Jan 29, 1941, 75 y.o.   MRN: [id number]  HPI 02/2015 Patient is here for CPE.  Her last colonosocpy was in 2015 and was normal.  She does not require a Pap smear as she has had a hysterectomy. She is due for a mammogram as well as a bone density test. She refuses both of these. She is due for a flu shot, Pneumovax 23, Prevnar 13, tetanus shot. She is willing to receive Pneumovax 23 today but she declines the rest of the shots. Labs were checked in February. CBC and CMP were relatively normal. Fasting lipid panel showed triglycerides greater than 500 and HDL cholesterol less than 40. She is here today to recheck her cholesterol.  At that time, my plan was: I will check a fasting lipid panel. Goal LDL cholesterol is less than 100. Goal HDL cholesterol is greater than 40. Goal triglycerides are less than 250. I will also monitor CMP to evaluate for elevations in her liver function test. Her CBC has been checked within the last year. She refuses a flu shot. She refuses a mammogram. She refuses a bone density. She will consent to Pneumovax 23. She has no desire to quit smoking. I recommended smoking cessation however  06/09/15  patient states that the neuropathy in her legs is worsening. She has a history of peripheral neuropathy. 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 At that time there was no significant nerve impingement. There were arthritic changes at the facets which may have caused nerve impingement in the lateral recesses but there is no significant spinal stenosis. Patient now states that both of her legs hurt from her hips down to her feet. It hurts even at rest while lying in the bed however he gets much worse whenever she stands and tries to walk. On exam today she has normal pulses to afford the dorsalis pedis bilaterally. There is no evidence of claudication. I am concerned by neurogenic claudication. She  has normal reflexes. Muscle strength is 4-5 over 5 equal and symmetric bilaterally. The weakness may be effort dependent. She denies any symptoms of cauda equina syndrome. She denies any bowel or bladder incontinence. She does report 2 weeks of mild dysuria, urgency, and frequency. Urinalysis today shows  +1 leukocyte esterase but is otherwise normal. Past Medical History  Diagnosis Date  . Anxiety   . Depression   . 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 Hypertension   . Insomnia   . Peripheral neuropathy (HCC)     no lumbar radiculopathy  . A-fib ([place])   . Gallstones   . Neuropathy ([place])   . Arrhythmia     afib  . Arthritis   . Colon polyps     due again 04/2017   Past Surgical History  Procedure Laterality Date  . Abdominal hysterectomy    . Cholecystectomy N/A 01/12/2014    Procedure: LAPAROSCOPIC CHOLECYSTECTOMY ;  Surgeon: Coralie Keens, MD;  Location: [facility];  Service: General;  Laterality: N/A;   Current Outpatient Prescriptions on File Prior to Visit  Medication Sig Dispense Refill  . amLODipine (NORVASC) 5 MG tablet Take 5 mg by mouth daily.    Marland Kitchen apixaban (ELIQUIS) 5 MG TABS tablet Take 1 tablet (5 mg total) by mouth 2 (two) times daily. 180 tablet 4  . diazepam (VALIUM) 5 MG tablet TAKE 1 TABLET BY MOUTH TWICE  DAILY. MUST LAST 30 DAYS 60 tablet 2  . Eluxadoline 100 MG TABS Take 100 mg by mouth 2 (two) times daily.    . fenofibrate (TRICOR) 145 MG tablet Take 1 tablet (145 mg total) by mouth daily. 90 tablet 3  . gabapentin (NEURONTIN) 600 MG tablet Take 1 tablet (600 mg total) by mouth 2 (two) times daily. 180 tablet 3  . hydrochlorothiazide (HYDRODIURIL) 25 MG tablet Take 1 tablet (25 mg total) by mouth daily. 90 tablet 3  . HYDROcodone-acetaminophen (NORCO/VICODIN) 5-325 MG tablet Take 1 tablet by mouth 2 (two) times daily. DO NOT FILL UNTIL 08/13/15 60 tablet 0  . HYDROcodone-acetaminophen (NORCO/VICODIN) 5-325 MG tablet Take 1 tablet by mouth 2 (two) times daily. DO  NOT FILL UNTIL 06/15/15 60 tablet 0  . hydrOXYzine (ATARAX/VISTARIL) 25 MG tablet TAKE 1 TABLET BY MOUTH AT BEDTIME 30 tablet 1  . metoprolol tartrate (LOPRESSOR) 25 MG tablet Take 1 tablet (25 mg total) by mouth 2 (two) times daily. 180 tablet 3  . potassium chloride SA (K-DUR,KLOR-CON) 20 MEQ tablet Take 1 tablet (20 mEq total) by mouth daily. 7 tablet 0  . pyridOXINE (VITAMIN B-6) 100 MG tablet Take 100 mg by mouth daily.    . simvastatin (ZOCOR) 20 MG tablet Take 1 tablet (20 mg total) by mouth at bedtime. 90 tablet 3  . vitamin E 400 UNIT capsule Take 400 Units by mouth daily.     No current facility-administered medications on file prior to visit.   Allergies  Allergen Reactions  . Paxil [Paroxetine Hcl]     Can't sleep   Social History   Social History  . Marital Status: Widowed    Spouse Name: N/A  . Number of Children: N/A  . Years of Education: N/A   Occupational History  . Not on file.   Social History Main Topics  . 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 Smokeless tobacco: Current User  . Alcohol Use: No  . Drug Use: No  . Sexual Activity: Not on file   Other Topics Concern  . Not on file   Social History Narrative   No family history on file.    Review of Systems  All other systems reviewed and are negative.      Objective:   Physical Exam  Constitutional: She is oriented to person, place, and time. She appears well-developed and well-nourished. No distress.  HENT:  Head: Normocephalic and atraumatic.  Right Ear: External ear normal.  Left Ear: External ear normal.  Nose: Nose normal.  Mouth/Throat: Oropharynx is clear and moist. No oropharyngeal exudate.  Eyes: Conjunctivae and EOM are normal. Pupils are equal, round, and reactive to light.  Neck: Normal range of motion. Neck supple. No JVD present. No thyromegaly present.  Cardiovascular: Normal rate, normal heart sounds and intact distal  pulses.  An irregularly irregular rhythm present. Exam reveals no gallop and no friction rub.   No murmur heard. Pulmonary/Chest: Effort normal and breath sounds normal. No respiratory distress. She has no wheezes. She has no rales. She exhibits no tenderness.  Musculoskeletal: Normal range of motion. She exhibits edema and tenderness.  Lymphadenopathy:    She has no cervical adenopathy.  Neurological: She is alert and oriented to person, place, and time. She has normal reflexes. No cranial nerve deficit. She exhibits normal muscle tone. Coordination normal.  Skin: Skin is warm. No rash noted. She is not diaphoretic.  No erythema. No pallor.  Psychiatric: She has a normal mood and affect. Her behavior is normal. Judgment and thought content normal.  Vitals reviewed.         Assessment & Plan:  Burning with urination - Plan: Urinalysis, Routine w reflex microscopic (not at [facility])  Neuropathy ([place])  Paroxysmal atrial fibrillation ([place])  Hypertriglyceridemia - Plan: COMPLETE METABOLIC PANEL WITH GFR, CBC with Differential/Platelet, Lipid panel   patient is overdue for fasting lab work. I would like her to return in the morning for labs. The last time I checked her triglycerides are over 500. She is in atrial fibrillation today but she continues to remain compliant with her eliquis.   Patient has peripheral neuropathy which we are treating with pain medication. However I'm concerned about possible spinal stenosis as a cause of her pain and she may be experiencing neurogenic claudication. I recommended an MRI of the lumbar spine but the patient refuses to allow me to schedule this. She continues to fixate on the fact the pain is in her legs and she doesn't understand it could be coming from her back. She promises that if the pain gets worse she will allow me to schedule an MRI area and she has fallen on 2 separate occasions due to poor balance and weakness in her legs. If this worsens we will need  to obtain an MRI to rule out neurogenic claudication. Urinalysis does not appear to be a urinary tract infection. I will send a urine culture and if the culture is positive I will treat it.   Otherwise I refilled her pain medication that she is been taking.

## 2015-06-10 ENCOUNTER — Other Ambulatory Visit: Payer: Medicare Other

## 2015-06-10 DIAGNOSIS — E781 Pure hyperglyceridemia: Secondary | ICD-10-CM | POA: Diagnosis not present

## 2015-06-10 LAB — CBC WITH DIFFERENTIAL/PLATELET
BASOS PCT: 0 % (ref 0–1)
Basophils Absolute: 0 10*3/uL (ref 0.0–0.1)
EOS ABS: 0.2 10*3/uL (ref 0.0–0.7)
EOS PCT: 2 % (ref 0–5)
HCT: 48.6 % — ABNORMAL HIGH (ref 36.0–46.0)
Hemoglobin: 17.6 g/dL — ABNORMAL HIGH (ref 12.0–15.0)
Lymphocytes Relative: 30 % (ref 12–46)
Lymphs Abs: 2.5 10*3/uL (ref 0.7–4.0)
MCH: 31.8 pg (ref 26.0–34.0)
MCHC: 36.2 g/dL — AB (ref 30.0–36.0)
MCV: 87.9 fL (ref 78.0–100.0)
MONO ABS: 0.7 10*3/uL (ref 0.1–1.0)
MONOS PCT: 8 % (ref 3–12)
MPV: 10.1 fL (ref 8.6–12.4)
NEUTROS ABS: 5 10*3/uL (ref 1.7–7.7)
Neutrophils Relative %: 60 % (ref 43–77)
PLATELETS: 220 10*3/uL (ref 150–400)
RBC: 5.53 MIL/uL — AB (ref 3.87–5.11)
RDW: 13.7 % (ref 11.5–15.5)
WBC: 8.3 10*3/uL (ref 4.0–10.5)

## 2015-06-10 LAB — LIPID PANEL
CHOLESTEROL: 145 mg/dL (ref 125–200)
HDL: 41 mg/dL — ABNORMAL LOW (ref >=46)
LDL Cholesterol: 49 mg/dL (ref <130)
TRIGLYCERIDES: 276 mg/dL — AB (ref <150)
Total CHOL/HDL Ratio: 3.5 Ratio (ref <=5.0)
VLDL: 55 mg/dL — ABNORMAL HIGH (ref <30)

## 2015-06-10 LAB — COMPLETE METABOLIC PANEL WITH GFR
ALK PHOS: 45 U/L (ref 33–130)
ALT: 21 U/L (ref 6–29)
AST: 37 U/L — AB (ref 10–35)
Albumin: 4 g/dL (ref 3.6–5.1)
BILIRUBIN TOTAL: 0.6 mg/dL (ref 0.2–1.2)
BUN: 16 mg/dL (ref 7–25)
CALCIUM: 9.3 mg/dL (ref 8.6–10.4)
CO2: 26 mmol/L (ref 20–31)
Chloride: 98 mmol/L (ref 98–110)
Creat: 1.27 mg/dL — ABNORMAL HIGH (ref 0.60–0.93)
GFR, EST AFRICAN AMERICAN: 48 mL/min — AB (ref >=60)
GFR, Est Non African American: 42 mL/min — ABNORMAL LOW (ref >=60)
Glucose, Bld: 98 mg/dL (ref 70–99)
POTASSIUM: 3.8 mmol/L (ref 3.5–5.3)
Sodium: 137 mmol/L (ref 135–146)
TOTAL PROTEIN: 6.6 g/dL (ref 6.1–8.1)

## 2015-06-12 LAB — URINE CULTURE

## 2015-06-15 ENCOUNTER — Other Ambulatory Visit: Payer: Self-pay | Admitting: Family Medicine

## 2015-06-15 MED ORDER — CIPROFLOXACIN HCL 250 MG PO TABS: 250.0000 mg | ORAL_TABLET | Freq: Two times a day (BID) | ORAL | Status: DC

## 2015-06-21 ENCOUNTER — Other Ambulatory Visit: Payer: Self-pay | Admitting: Family Medicine

## 2015-06-21 NOTE — Telephone Encounter (Signed)
Medication refilled per protocol. 

## 2015-07-21 ENCOUNTER — Ambulatory Visit (INDEPENDENT_AMBULATORY_CARE_PROVIDER_SITE_OTHER): Payer: Medicare Other | Admitting: Family Medicine

## 2015-07-21 ENCOUNTER — Encounter: Payer: Self-pay | Admitting: Family Medicine

## 2015-07-21 VITALS — BP 110/88 | HR 86 | Temp 97.7°F | Resp 20 | Ht 64.5 in | Wt 193.0 lb

## 2015-07-21 DIAGNOSIS — R300 Dysuria: Secondary | ICD-10-CM

## 2015-07-21 DIAGNOSIS — N952 Postmenopausal atrophic vaginitis: Secondary | ICD-10-CM

## 2015-07-21 DIAGNOSIS — N390 Urinary tract infection, site not specified: Secondary | ICD-10-CM

## 2015-07-21 LAB — URINALYSIS, MICROSCOPIC ONLY
CASTS: NONE SEEN [LPF]
CRYSTALS: NONE SEEN [HPF]
YEAST: NONE SEEN [HPF]

## 2015-07-21 LAB — URINALYSIS, ROUTINE W REFLEX MICROSCOPIC
Bilirubin Urine: NEGATIVE
GLUCOSE, UA: NEGATIVE
KETONES UR: NEGATIVE
NITRITE: NEGATIVE
PH: 5 (ref 5.0–8.0)
Protein, ur: NEGATIVE
SPECIFIC GRAVITY, URINE: 1.02 (ref 1.001–1.035)

## 2015-07-21 MED ORDER — CIPROFLOXACIN HCL 250 MG PO TABS: 250.0000 mg | ORAL_TABLET | Freq: Two times a day (BID) | ORAL | Status: DC

## 2015-07-21 MED ORDER — ESTROGENS, CONJUGATED 0.625 MG/GM VA CREA: 1.0000 | TOPICAL_CREAM | Freq: Every day | VAGINAL | Status: DC

## 2015-07-21 NOTE — Progress Notes (Signed)
Subjective:    Patient ID: Catherine Orozco, female    DOB: [birth year], 75 y.o.   MRN: [id number]  Urinary Tract Infection   02/2015 Patient is here for CPE.  Her last colonosocpy was in 2015 and was normal.  She does not require a Pap smear as she has had a hysterectomy. She is due for a mammogram as well as a bone density test. She refuses both of these. She is due for a flu shot, Pneumovax 23, Prevnar 13, tetanus shot. She is willing to receive Pneumovax 23 today but she declines the rest of the shots. Labs were checked in February. CBC and CMP were relatively normal. Fasting lipid panel showed triglycerides greater than 500 and HDL cholesterol less than 40. She is here today to recheck her cholesterol.  At that time, my plan was: I will check a fasting lipid panel. Goal LDL cholesterol is less than 100. Goal HDL cholesterol is greater than 40. Goal triglycerides are less than 250. I will also monitor CMP to evaluate for elevations in her liver function test. Her CBC has been checked within the last year. She refuses a flu shot. She refuses a mammogram. She refuses a bone density. She will consent to Pneumovax 23. She has no desire to quit smoking. I recommended smoking cessation however  06/09/15  patient states that the neuropathy in her legs is worsening. She has a history of peripheral neuropathy. 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 At that time there was no significant nerve impingement. There were arthritic changes at the facets which may have caused nerve impingement in the lateral recesses but there is no significant spinal stenosis. Patient now states that both of her legs hurt from her hips down to her feet. It hurts even at rest while lying in the bed however he gets much worse whenever she stands and tries to walk. On exam today she has normal pulses to afford the dorsalis pedis bilaterally. There is no evidence of claudication. I am concerned by  neurogenic claudication. She has normal reflexes. Muscle strength is 4-5 over 5 equal and symmetric bilaterally. The weakness may be effort dependent. She denies any symptoms of cauda equina syndrome. She denies any bowel or bladder incontinence. She does report 2 weeks of mild dysuria, urgency, and frequency. Urinalysis today shows  +1 leukocyte esterase but is otherwise normal.  At that time, my plan was:  patient is overdue for fasting lab work. I would like her to return in the morning for labs. The last time I checked her triglycerides are over 500. She is in atrial fibrillation today but she continues to remain compliant with her eliquis.   Patient has peripheral neuropathy which we are treating with pain medication. However I'm concerned about possible spinal stenosis as a cause of her pain and she may be experiencing neurogenic claudication. I recommended an MRI of the lumbar spine but the patient refuses to allow me to schedule this. She continues to fixate on the fact the pain is in her legs and she doesn't understand it could be coming from her back. She promises that if the pain gets worse she will allow me to schedule an MRI area and she has fallen on 2 separate occasions due to poor balance and weakness in her legs. If this worsens we will need to obtain an MRI to rule out neurogenic claudication. Urinalysis does not appear to be a urinary tract infection. I  will send a urine culture and if the culture is positive I will treat it.   Otherwise I refilled her pain medication that she is been taking.  07/21/15  ultimately, urine culture confirmed Klebsiella pneumonia and the patient was treated with Cipro. She states the symptoms completely subsided. However approximately 7 days ago she also started developing dysuria again. She reports urgency and frequency. It seems like every time I see this patient she is having symptoms of a bladder infection. Quite frankly that her culture confirmed. She is  postmenopausal and does complain of vaginal dryness. Past Medical History  Diagnosis Date  . Anxiety   . Depression   . 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 Hypertension   . Insomnia   . Peripheral neuropathy (HCC)     no lumbar radiculopathy  . A-fib ([place])   . Gallstones   . Neuropathy ([place])   . Arrhythmia     afib  . Arthritis   . Colon polyps     due again 04/2017   Past Surgical History  Procedure Laterality Date  . Abdominal hysterectomy    . Cholecystectomy N/A 01/12/2014    Procedure: LAPAROSCOPIC CHOLECYSTECTOMY ;  Surgeon: Coralie Keens, MD;  Location: [facility];  Service: General;  Laterality: N/A;   Current Outpatient Prescriptions on File Prior to Visit  Medication Sig Dispense Refill  . amLODipine (NORVASC) 5 MG tablet Take 5 mg by mouth daily.    Marland Kitchen apixaban (ELIQUIS) 5 MG TABS tablet Take 1 tablet (5 mg total) by mouth 2 (two) times daily. 180 tablet 4  . ciprofloxacin (CIPRO) 250 MG tablet Take 1 tablet (250 mg total) by mouth 2 (two) times daily. 14 tablet 0  . diazepam (VALIUM) 5 MG tablet TAKE 1 TABLET BY MOUTH TWICE DAILY. MUST LAST 30 DAYS 60 tablet 2  . Eluxadoline 100 MG TABS Take 100 mg by mouth 2 (two) times daily.    . fenofibrate (TRICOR) 145 MG tablet Take 1 tablet (145 mg total) by mouth daily. 90 tablet 3  . gabapentin (NEURONTIN) 600 MG tablet Take 1 tablet (600 mg total) by mouth 2 (two) times daily. 180 tablet 3  . hydrochlorothiazide (HYDRODIURIL) 25 MG tablet Take 1 tablet (25 mg total) by mouth daily. 90 tablet 3  . HYDROcodone-acetaminophen (NORCO/VICODIN) 5-325 MG tablet Take 1 tablet by mouth 2 (two) times daily. DO NOT FILL UNTIL 08/13/15 60 tablet 0  . HYDROcodone-acetaminophen (NORCO/VICODIN) 5-325 MG tablet Take 1 tablet by mouth 2 (two) times daily. DO NOT FILL UNTIL 06/15/15 60 tablet 0  . hydrOXYzine (ATARAX/VISTARIL) 25 MG tablet TAKE 1 TABLET BY MOUTH AT BEDTIME 30 tablet 2  . metoprolol tartrate (LOPRESSOR) 25 MG tablet Take 1 tablet  (25 mg total) by mouth 2 (two) times daily. 180 tablet 3  . potassium chloride SA (K-DUR,KLOR-CON) 20 MEQ tablet Take 1 tablet (20 mEq total) by mouth daily. 7 tablet 0  . pyridOXINE (VITAMIN B-6) 100 MG tablet Take 100 mg by mouth daily.    . simvastatin (ZOCOR) 20 MG tablet Take 1 tablet (20 mg total) by mouth at bedtime. 90 tablet 3  . vitamin E 400 UNIT capsule Take 400 Units by mouth daily.     No current facility-administered medications on file prior to visit.   Allergies  Allergen Reactions  . Paxil [Paroxetine Hcl]     Can't sleep   Social History   Social History  . Marital Status: Widowed  Spouse Name: N/A  . Number of Children: N/A  . Years of Education: N/A   Occupational History  . Not on file.   Social History Main Topics  . 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 Smokeless tobacco: Current User  . Alcohol Use: No  . Drug Use: No  . Sexual Activity: Not on file   Other Topics Concern  . Not on file   Social History Narrative   No family history on file.    Review of Systems  All other systems reviewed and are negative.      Objective:   Physical Exam  Constitutional: She is oriented to person, place, and time. She appears well-developed and well-nourished. No distress.  HENT:  Head: Normocephalic and atraumatic.  Right Ear: External ear normal.  Left Ear: External ear normal.  Nose: Nose normal.  Mouth/Throat: Oropharynx is clear and moist. No oropharyngeal exudate.  Eyes: Conjunctivae and EOM are normal. Pupils are equal, round, and reactive to light.  Neck: Normal range of motion. Neck supple. No JVD present. No thyromegaly present.  Cardiovascular: Normal rate, normal heart sounds and intact distal pulses.  An irregularly irregular rhythm present. Exam reveals no gallop and no friction rub.   No murmur heard. Pulmonary/Chest: Effort normal and breath sounds normal. No respiratory  distress. She has no wheezes. She has no rales. She exhibits no tenderness.  Musculoskeletal: Normal range of motion. She exhibits edema and tenderness.  Lymphadenopathy:    She has no cervical adenopathy.  Neurological: She is alert and oriented to person, place, and time. She has normal reflexes. No cranial nerve deficit. She exhibits normal muscle tone. Coordination normal.  Skin: Skin is warm. No rash noted. She is not diaphoretic. No erythema. No pallor.  Psychiatric: She has a normal mood and affect. Her behavior is normal. Judgment and thought content normal.  Vitals reviewed.         Assessment & Plan:  Burning with urination - Plan: Urinalysis, Routine w reflex microscopic (not at [facility])   the patient gets frequent urinary tract infections. 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 Meanwhile treat this urinary tract infection with Cipro 250 mg by mouth twice a day for 1 week.

## 2015-07-21 NOTE — Addendum Note (Signed)
Addended by: Haskel Khan A on: 07/21/2015 02:39 PM   Modules accepted: Orders

## 2015-07-22 LAB — URINE CULTURE

## 2015-08-08 ENCOUNTER — Other Ambulatory Visit: Payer: Self-pay | Admitting: Physician Assistant

## 2015-08-08 NOTE — Telephone Encounter (Signed)
Medication called to pharmacy. 

## 2015-08-08 NOTE — Telephone Encounter (Signed)
ok 

## 2015-08-08 NOTE — Telephone Encounter (Signed)
Ok to refill??  Last office visit 07/21/2015.  Last refill 03/31/2015, #2 refills.

## 2015-09-05 ENCOUNTER — Telehealth: Payer: Self-pay | Admitting: Family Medicine

## 2015-09-05 NOTE — Telephone Encounter (Signed)
Ok to refill 

## 2015-09-05 NOTE — Telephone Encounter (Signed)
Pt is requesting a 3 mo supply of Hydrocodone.   [phone]

## 2015-09-05 NOTE — Telephone Encounter (Signed)
ok 

## 2015-09-06 ENCOUNTER — Encounter: Payer: Self-pay | Admitting: Family Medicine

## 2015-09-06 MED ORDER — HYDROCODONE-ACETAMINOPHEN 5-325 MG PO TABS: 1.0000 | ORAL_TABLET | Freq: Two times a day (BID) | ORAL | Status: DC

## 2015-09-06 NOTE — Telephone Encounter (Signed)
RX printed, left up front and patient's daughter aware to pick up

## 2015-09-06 NOTE — Telephone Encounter (Signed)
This encounter was created in error - please disregard.

## 2015-09-07 ENCOUNTER — Telehealth: Payer: Self-pay | Admitting: Family Medicine

## 2015-09-07 DIAGNOSIS — Z79890 Hormone replacement therapy (postmenopausal): Secondary | ICD-10-CM

## 2015-09-07 DIAGNOSIS — Z1231 Encounter for screening mammogram for malignant neoplasm of breast: Secondary | ICD-10-CM

## 2015-09-07 DIAGNOSIS — Z780 Asymptomatic menopausal state: Secondary | ICD-10-CM

## 2015-09-07 NOTE — Telephone Encounter (Signed)
Patient is calling to speak with you regarding a referral that was supposed to be placed for a mammogram  [phone]

## 2015-09-09 NOTE — Telephone Encounter (Signed)
Daughter called and ask for me to schedule Mammogram and Dexa for mother and she will take her.  Referral placed

## 2015-09-19 ENCOUNTER — Other Ambulatory Visit: Payer: Self-pay | Admitting: Family Medicine

## 2015-09-19 NOTE — Telephone Encounter (Signed)
Refill appropriate and filled per protocol. 

## 2015-09-26 ENCOUNTER — Ambulatory Visit (INDEPENDENT_AMBULATORY_CARE_PROVIDER_SITE_OTHER): Payer: Medicare Other | Admitting: Family Medicine

## 2015-09-26 ENCOUNTER — Encounter: Payer: Self-pay | Admitting: Family Medicine

## 2015-09-26 ENCOUNTER — Telehealth: Payer: Self-pay

## 2015-09-26 ENCOUNTER — Other Ambulatory Visit: Payer: Self-pay | Admitting: Cardiovascular Disease

## 2015-09-26 ENCOUNTER — Encounter: Payer: Self-pay | Admitting: Cardiovascular Disease

## 2015-09-26 VITALS — BP 118/68 | HR 82 | Temp 98.0°F | Resp 18 | Ht 64.5 in | Wt 201.0 lb

## 2015-09-26 DIAGNOSIS — I480 Paroxysmal atrial fibrillation: Secondary | ICD-10-CM

## 2015-09-26 DIAGNOSIS — E781 Pure hyperglyceridemia: Secondary | ICD-10-CM

## 2015-09-26 DIAGNOSIS — Z23 Encounter for immunization: Secondary | ICD-10-CM

## 2015-09-26 DIAGNOSIS — R300 Dysuria: Secondary | ICD-10-CM | POA: Diagnosis not present

## 2015-09-26 LAB — URINALYSIS, MICROSCOPIC ONLY
BACTERIA UA: NONE SEEN [HPF]
Casts: NONE SEEN [LPF]
Crystals: NONE SEEN [HPF]
RBC / HPF: NONE SEEN RBC/HPF (ref <=2)
YEAST: NONE SEEN [HPF]

## 2015-09-26 LAB — URINALYSIS, ROUTINE W REFLEX MICROSCOPIC
BILIRUBIN URINE: NEGATIVE
Glucose, UA: NEGATIVE
KETONES UR: NEGATIVE
Nitrite: NEGATIVE
PROTEIN: NEGATIVE
Specific Gravity, Urine: 1.02 (ref 1.001–1.035)
pH: 5.5 (ref 5.0–8.0)

## 2015-09-26 MED ORDER — SIMVASTATIN 20 MG PO TABS: 20.0000 mg | ORAL_TABLET | Freq: Every day | ORAL | Status: DC

## 2015-09-26 MED ORDER — SULFAMETHOXAZOLE-TRIMETHOPRIM 800-160 MG PO TABS: 1.0000 | ORAL_TABLET | Freq: Two times a day (BID) | ORAL | Status: DC

## 2015-09-26 NOTE — Telephone Encounter (Signed)
This encounter was created in error - please disregard.

## 2015-09-26 NOTE — Addendum Note (Signed)
Addended by: Shary Decamp B on: 09/26/2015 04:23 PM   Modules accepted: Orders

## 2015-09-26 NOTE — Progress Notes (Signed)
Subjective:    Patient ID: Catherine Orozco, female    DOB: 05-Apr-1941, 75 y.o.   MRN: [id number]  HPI  Patient reports a 2 week history of a bladder infection. She reports dysuria, frequency and urgency, however she has hesitancy and dribbling when she tries to urinate. She denies any fevers or chills or low back pain. She complains of the symptoms quite often. Most recent urine culture showed insignificant growth of March. She is overdue for Pneumovax 23. She is also due for mammogram. She declines a mammogram and she will allow me to give her the Pneumovax 23. Due to her age she does not require a Pap smear. She is long overdue for fasting lab work to monitor her triglycerides. Today she is in atrial fibrillation. Her heart rate is between 90 and 100 bpm. She denies any chest pain shortness of breath or dyspnea on exertion. She denies any myalgias or right upper quadrant pain. Past Medical History  Diagnosis Date  . Anxiety   . Depression   . 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 Hypertension   . Insomnia   . Peripheral neuropathy (HCC)     no lumbar radiculopathy  . A-fib ([place])   . Gallstones   . Neuropathy ([place])   . Arrhythmia     afib  . Arthritis   . Colon polyps     due again 04/2017   Past Surgical History  Procedure Laterality Date  . Abdominal hysterectomy    . Cholecystectomy N/A 01/12/2014    Procedure: LAPAROSCOPIC CHOLECYSTECTOMY ;  Surgeon: Coralie Keens, MD;  Location: [facility];  Service: General;  Laterality: N/A;   Current Outpatient Prescriptions on File Prior to Visit  Medication Sig Dispense Refill  . amLODipine (NORVASC) 5 MG tablet Take 5 mg by mouth daily.    Marland Kitchen apixaban (ELIQUIS) 5 MG TABS tablet Take 1 tablet (5 mg total) by mouth 2 (two) times daily. 180 tablet 4  . conjugated estrogens (PREMARIN) vaginal cream Place 1 Applicatorful vaginally daily. 42.5 g 12  . diazepam (VALIUM) 5 MG tablet TAKE 1 TABLET BY MOUTH TWICE A DAY AS NEEDED FOR  ANXIETY--MUST LAST 30 DAYS 60 tablet 2  . Eluxadoline 100 MG TABS Take 100 mg by mouth 2 (two) times daily.    . fenofibrate (TRICOR) 145 MG tablet Take 1 tablet (145 mg total) by mouth daily. 90 tablet 3  . gabapentin (NEURONTIN) 600 MG tablet Take 1 tablet (600 mg total) by mouth 2 (two) times daily. 180 tablet 3  . hydrochlorothiazide (HYDRODIURIL) 25 MG tablet Take 1 tablet (25 mg total) by mouth daily. 90 tablet 3  . HYDROcodone-acetaminophen (NORCO/VICODIN) 5-325 MG tablet Take 1 tablet by mouth 2 (two) times daily. DO NOT FILL UNTIL 11/12/15 60 tablet 0  . hydrOXYzine (ATARAX/VISTARIL) 25 MG tablet TAKE 1 TABLET BY MOUTH AT BEDTIME 30 tablet 2  . metoprolol tartrate (LOPRESSOR) 25 MG tablet Take 1 tablet (25 mg total) by mouth 2 (two) times daily. 180 tablet 3  . potassium chloride SA (K-DUR,KLOR-CON) 20 MEQ tablet Take 1 tablet (20 mEq total) by mouth daily. 7 tablet 0  . pyridOXINE (VITAMIN B-6) 100 MG tablet Take 100 mg by mouth daily.    . vitamin E 400 UNIT capsule Take 400 Units by mouth daily.     No current facility-administered medications on file prior to visit.   Allergies  Allergen Reactions  . Paxil [Paroxetine Hcl]  Can't sleep   Social History   Social History  . Marital Status: Widowed    Spouse Name: N/A  . Number of Children: N/A  . Years of Education: N/A   Occupational History  . Not on file.   Social History Main Topics  . 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 Smokeless tobacco: Current User  . Alcohol Use: No  . Drug Use: No  . Sexual Activity: Not on file   Other Topics Concern  . Not on file   Social History Narrative     Review of Systems  All other systems reviewed and are negative.      Objective:   Physical Exam  Cardiovascular: Normal rate.  An irregularly irregular rhythm present.  Pulmonary/Chest: Effort normal. No respiratory distress. She has no decreased breath  sounds. She has no wheezes. She has no rhonchi. She has no rales.  Abdominal: Soft. Bowel sounds are normal. She exhibits no distension and no mass. There is no tenderness. There is no rebound and no guarding.  Musculoskeletal: She exhibits no edema.  Vitals reviewed.         Assessment & Plan:  Burning with urination - Plan: Urinalysis, Routine w reflex microscopic (not at [facility])  Paroxysmal atrial fibrillation (HCC)  Hypertriglyceridemia - Plan: CBC with Differential/Platelet, COMPLETE METABOLIC PANEL WITH GFR, Lipid panel  Urinalysis shows trace leukocyte esterase. Given that in the classic symptoms I will treat her with Bactrim double strength tablets 1 by mouth twice a day for 3 days. I will send a urine culture area and heart rate is well controlled. She is overdue to monitor CBC to monitor for anemia on her chronic anticoagulation. I have recommended that she return fasting for a lipid panel to monitor her LDL cholesterol and her triglycerides. She is overdue for mammogram but she refuses that today. She will consent to Pneumovax 23.

## 2015-09-26 NOTE — Telephone Encounter (Signed)
Called pt to schedule a f/u appt for medication refills, states she did not want to make an appointment and hung up the phone.

## 2015-09-26 NOTE — Addendum Note (Signed)
Addended by: Shary Decamp B on: 09/26/2015 04:24 PM   Modules accepted: Orders

## 2015-09-26 NOTE — Telephone Encounter (Signed)
-----   Message from Anselm Pancoast, [place] sent at 09/26/2015  9:27 AM EDT ----- Please contact patient for a follow up appt with Dr. Rockey Situ! She is needing refills.  Thanks!

## 2015-09-28 LAB — URINE CULTURE
COLONY COUNT: NO GROWTH
Organism ID, Bacteria: NO GROWTH

## 2015-09-29 ENCOUNTER — Other Ambulatory Visit: Payer: Self-pay | Admitting: Family Medicine

## 2015-09-29 DIAGNOSIS — N390 Urinary tract infection, site not specified: Secondary | ICD-10-CM

## 2015-09-29 DIAGNOSIS — R300 Dysuria: Secondary | ICD-10-CM

## 2015-10-03 ENCOUNTER — Other Ambulatory Visit: Payer: Medicare Other

## 2015-10-03 DIAGNOSIS — E785 Hyperlipidemia, unspecified: Secondary | ICD-10-CM | POA: Diagnosis not present

## 2015-10-03 DIAGNOSIS — Z79899 Other long term (current) drug therapy: Secondary | ICD-10-CM

## 2015-10-03 DIAGNOSIS — I10 Essential (primary) hypertension: Secondary | ICD-10-CM

## 2015-10-04 ENCOUNTER — Encounter: Payer: Self-pay | Admitting: Family Medicine

## 2015-10-04 LAB — LIPID PANEL
CHOL/HDL RATIO: 3.2 ratio (ref <=5.0)
Cholesterol: 119 mg/dL — ABNORMAL LOW (ref 125–200)
HDL: 37 mg/dL — AB (ref >=46)
LDL Cholesterol: 28 mg/dL (ref <130)
TRIGLYCERIDES: 272 mg/dL — AB (ref <150)
VLDL: 54 mg/dL — ABNORMAL HIGH (ref <30)

## 2015-10-04 LAB — COMPREHENSIVE METABOLIC PANEL
ALBUMIN: 4.3 g/dL (ref 3.6–5.1)
ALK PHOS: 51 U/L (ref 33–130)
ALT: 20 U/L (ref 6–29)
AST: 39 U/L — AB (ref 10–35)
BILIRUBIN TOTAL: 0.6 mg/dL (ref 0.2–1.2)
BUN: 17 mg/dL (ref 7–25)
CALCIUM: 9.6 mg/dL (ref 8.6–10.4)
CO2: 23 mmol/L (ref 20–31)
Chloride: 96 mmol/L — ABNORMAL LOW (ref 98–110)
Creat: 1.48 mg/dL — ABNORMAL HIGH (ref 0.60–0.93)
GLUCOSE: 102 mg/dL — AB (ref 70–99)
POTASSIUM: 4.1 mmol/L (ref 3.5–5.3)
Sodium: 136 mmol/L (ref 135–146)
Total Protein: 6.7 g/dL (ref 6.1–8.1)

## 2015-10-04 LAB — CBC WITH DIFFERENTIAL/PLATELET
Basophils Absolute: 168 cells/uL (ref 0–200)
Basophils Relative: 2 %
EOS ABS: 168 {cells}/uL (ref 15–500)
Eosinophils Relative: 2 %
HEMATOCRIT: 49 % — AB (ref 35.0–45.0)
HEMOGLOBIN: 17.4 g/dL — AB (ref 12.0–15.0)
LYMPHS ABS: 2856 {cells}/uL (ref 850–3900)
LYMPHS PCT: 34 %
MCH: 31.4 pg (ref 27.0–33.0)
MCHC: 35.5 g/dL (ref 32.0–36.0)
MCV: 88.4 fL (ref 80.0–100.0)
MONO ABS: 672 {cells}/uL (ref 200–950)
MPV: 10.7 fL (ref 7.5–12.5)
Monocytes Relative: 8 %
NEUTROS PCT: 54 %
Neutro Abs: 4536 cells/uL (ref 1500–7800)
Platelets: 216 10*3/uL (ref 140–400)
RBC: 5.54 MIL/uL — AB (ref 3.80–5.10)
RDW: 14.1 % (ref 11.0–15.0)
WBC: 8.4 10*3/uL (ref 3.8–10.8)

## 2015-10-05 ENCOUNTER — Other Ambulatory Visit: Payer: Self-pay | Admitting: Family Medicine

## 2015-10-05 DIAGNOSIS — R799 Abnormal finding of blood chemistry, unspecified: Secondary | ICD-10-CM

## 2015-10-06 ENCOUNTER — Ambulatory Visit: Payer: Self-pay

## 2015-10-18 ENCOUNTER — Other Ambulatory Visit: Payer: Self-pay | Admitting: Physician Assistant

## 2015-10-25 ENCOUNTER — Encounter: Payer: Self-pay | Admitting: Urology

## 2015-10-25 ENCOUNTER — Ambulatory Visit (INDEPENDENT_AMBULATORY_CARE_PROVIDER_SITE_OTHER): Payer: Medicare Other | Admitting: Urology

## 2015-10-25 VITALS — BP 111/77 | HR 98 | Ht 66.0 in | Wt 199.7 lb

## 2015-10-25 DIAGNOSIS — R300 Dysuria: Secondary | ICD-10-CM | POA: Diagnosis not present

## 2015-10-25 DIAGNOSIS — N362 Urethral caruncle: Secondary | ICD-10-CM

## 2015-10-25 DIAGNOSIS — E119 Type 2 diabetes mellitus without complications: Secondary | ICD-10-CM | POA: Insufficient documentation

## 2015-10-25 DIAGNOSIS — I10 Essential (primary) hypertension: Secondary | ICD-10-CM

## 2015-10-25 HISTORY — DX: Essential (primary) hypertension: I10

## 2015-10-25 LAB — BLADDER SCAN AMB NON-IMAGING: SCAN RESULT: 42

## 2015-10-25 MED ORDER — ESTROGENS, CONJUGATED 0.625 MG/GM VA CREA: 0.5000 g | TOPICAL_CREAM | VAGINAL | Status: AC

## 2015-10-25 NOTE — Progress Notes (Signed)
10/25/2015 2:39 PM   Catherine Orozco [birth year] [id number]  Referring provider: [name], MD [address] [facility] [place], [place] [address]  Chief Complaint  Patient presents with  . New Patient (Initial Visit)    dysuria, recurrent UTI    HPI: [name] is a 75yo seen in consultation for recurrent UTIs. She had had UTI symptoms which are intermittent for the past 3-4 months. She was given rx for premarin but only used for 1 week.  When she is diagnosed with a UTI she is not having any symptoms. She does not wear pads. No urinary incontinence  She has had multiple urine cultures in epic but only 1 grew Klebsiella, no theres either had low colony counts or insufficient growth.  No issues with constipation, occasional diarrhea.   PVR is 42cc   PMH: Past Medical History  Diagnosis Date  . Anxiety   . Depression   . 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 Hypertension   . Insomnia   . Peripheral neuropathy (HCC)     no lumbar radiculopathy  . A-fib ([place])   . Gallstones   . Neuropathy ([place])   . Arrhythmia     afib  . Arthritis   . Colon polyps     due again 04/2017  . CKD (chronic kidney disease) stage 3, GFR 30-59 ml/min   . Atrial fibrillation ([place]) 12/23/2012    Overview:  Last Assessment & Plan:  Maintaining normal sinus rhythm. Suggested she continue on anticoagulation and metoprolol   . BP (high blood pressure) 10/25/2015  . Calculus of gallbladder 12/11/2013    Overview:  Last Assessment & Plan:  Acceptable risk for upcoming gallbladder surgery. Suggested she stop her anticoagulation 3 days prior to surgery. She is maintaining normal sinus rhythm. This could be restarted when she is felt to be in appropriate anticoagulation candidate, would recommend at least 2 or 3 days post procedure     Surgical History: Past Surgical History  Procedure Laterality Date  . Abdominal hysterectomy    . Cholecystectomy N/A 01/12/2014    Procedure: LAPAROSCOPIC  CHOLECYSTECTOMY ;  Surgeon: Coralie Keens, MD;  Location: [facility];  Service: General;  Laterality: N/A;    Home Medications:    Medication List       This list is accurate as of: 10/25/15  2:39 PM.  Always use your most recent med list.               amLODipine 5 MG tablet  Commonly known as:  NORVASC  Take 5 mg by mouth daily. Reported on 10/25/2015     apixaban 5 MG Tabs tablet  Commonly known as:  ELIQUIS  Take 1 tablet (5 mg total) by mouth 2 (two) times daily.     conjugated estrogens vaginal cream  Commonly known as:  PREMARIN  Place 1 Applicatorful vaginally daily.     diazepam 5 MG tablet  Commonly known as:  VALIUM  TAKE 1 TABLET BY MOUTH TWICE A DAY AS NEEDED FOR ANXIETY--MUST LAST 30 DAYS     Eluxadoline 100 MG Tabs  Take 100 mg by mouth 2 (two) times daily. Reported on 10/25/2015     fenofibrate 145 MG tablet  Commonly known as:  TRICOR  TAKE 1 TABLET (145 MG TOTAL) BY MOUTH DAILY.     gabapentin 600 MG tablet  Commonly known as:  NEURONTIN  Take 1 tablet (600 mg total) by mouth 2 (  two) times daily.     hydrochlorothiazide 25 MG tablet  Commonly known as:  HYDRODIURIL  Take 1 tablet (25 mg total) by mouth daily.     HYDROcodone-acetaminophen 5-325 MG tablet  Commonly known as:  NORCO/VICODIN  Take 1 tablet by mouth 2 (two) times daily. DO NOT FILL UNTIL 11/12/15     hydrOXYzine 25 MG tablet  Commonly known as:  ATARAX/VISTARIL  TAKE 1 TABLET BY MOUTH AT BEDTIME     metoprolol tartrate 25 MG tablet  Commonly known as:  LOPRESSOR  Take 1 tablet (25 mg total) by mouth 2 (two) times daily.     potassium chloride SA 20 MEQ tablet  Commonly known as:  K-DUR,KLOR-CON  Take 1 tablet (20 mEq total) by mouth daily.     pyridOXINE 100 MG tablet  Commonly known as:  VITAMIN B-6  Take 100 mg by mouth daily.     simvastatin 20 MG tablet  Commonly known as:  ZOCOR  Take 1 tablet (20 mg total) by mouth at bedtime.     sulfamethoxazole-trimethoprim 800-160  MG tablet  Commonly known as:  BACTRIM DS,SEPTRA DS  Take 1 tablet by mouth 2 (two) times daily.     vitamin E 400 UNIT capsule  Take 400 Units by mouth daily.        Allergies:  Allergies  Allergen Reactions  . Paxil [Paroxetine Hcl]     Can't sleep    Family History: No family history on file.  Social History:  reports that she has been smoking Cigarettes.  She has a 55 pack-year smoking history. She uses smokeless tobacco. She reports that she does not drink alcohol or use illicit drugs.  ROS:                                        Physical Exam: BP 111/77 mmHg  Pulse 98  Ht '5\' 6"'$  (1.676 m)  Wt 90.583 kg (199 lb 11.2 oz)  BMI 32.25 kg/m2  Constitutional:  Alert and oriented, No acute distress. HEENT: [place] AT, moist mucus membranes.  Trachea midline, no masses. Cardiovascular: No clubbing, cyanosis, or edema. Respiratory: Normal respiratory effort, no increased work of breathing. GI: Abdomen is soft, nontender, nondistended, no abdominal masses GU: No CVA tenderness. Moderate vulvar atrophy and mild labial edema. Urethral caruncle at 6 o clock. NO cystocele, no rectocele.  Skin: No rashes, bruises or suspicious lesions. Lymph: No cervical or inguinal adenopathy. Neurologic: Grossly intact, no focal deficits, moving all 4 extremities. Psychiatric: Normal mood and affect.  Laboratory Data: Lab Results  Component Value Date   WBC 8.4 10/03/2015   HGB 17.4* 10/03/2015   HCT 49.0* 10/03/2015   MCV 88.4 10/03/2015   PLT 216 10/03/2015    Lab Results  Component Value Date   CREATININE 1.48* 10/03/2015    No results found for: PSA  No results found for: TESTOSTERONE  No results found for: HGBA1C  Urinalysis    Component Value Date/Time   COLORURINE YELLOW 09/26/2015 [place] 09/26/2015 1422   LABSPEC 1.020 09/26/2015 1422   PHURINE 5.5 09/26/2015 [place] 09/26/2015 1422   HGBUR TRACE* 09/26/2015  1422   [place] 09/26/2015 [place] 09/26/2015 1422   PROTEINUR NEGATIVE 09/26/2015 1422   UROBILINOGEN 0.2 09/27/2014 1449   NITRITE NEGATIVE 09/26/2015 1422   LEUKOCYTESUR TRACE* 09/26/2015  1422    Pertinent Imaging: none  Assessment & Plan:    1. Dysuria -estrace cream - Urinalysis, Complete - CULTURE, URINE COMPREHENSIVE  2. Urethral caruncle Estrace cream daily for 14 days then 3x per week for 3 month RTC 3 months   No Follow-up on file.  Nicolette Bang, MD  [facility] [address] [place], [place] [address] [phone]

## 2015-10-26 LAB — URINALYSIS, COMPLETE
BILIRUBIN UA: NEGATIVE
GLUCOSE, UA: NEGATIVE
KETONES UA: NEGATIVE
Nitrite, UA: NEGATIVE
PROTEIN UA: NEGATIVE
SPEC GRAV UA: 1.02 (ref 1.005–1.030)
Urobilinogen, Ur: 0.2 mg/dL (ref 0.2–1.0)
pH, UA: 5.5 (ref 5.0–7.5)

## 2015-10-26 LAB — MICROSCOPIC EXAMINATION

## 2015-10-27 LAB — CULTURE, URINE COMPREHENSIVE

## 2015-11-24 ENCOUNTER — Other Ambulatory Visit: Payer: Self-pay | Admitting: Family Medicine

## 2015-11-25 NOTE — Telephone Encounter (Signed)
Refill appropriate and filled per protocol. 

## 2015-12-05 ENCOUNTER — Telehealth: Payer: Self-pay | Admitting: Family Medicine

## 2015-12-05 NOTE — Telephone Encounter (Signed)
Ok to refill 

## 2015-12-06 MED ORDER — HYDROCODONE-ACETAMINOPHEN 5-325 MG PO TABS: 1.0000 | ORAL_TABLET | Freq: Two times a day (BID) | ORAL | 0 refills | Status: DC

## 2015-12-06 NOTE — Telephone Encounter (Signed)
RX's x 3 month printed, left up front and patient aware to pick up after 2 pm via vm

## 2015-12-12 ENCOUNTER — Other Ambulatory Visit: Payer: Self-pay | Admitting: Family Medicine

## 2015-12-12 NOTE — Telephone Encounter (Signed)
ok 

## 2015-12-12 NOTE — Telephone Encounter (Signed)
Ok to refill 

## 2015-12-14 NOTE — Telephone Encounter (Signed)
Medication called to pharmacy. 

## 2015-12-19 ENCOUNTER — Other Ambulatory Visit: Payer: Self-pay | Admitting: Family Medicine

## 2016-01-10 ENCOUNTER — Other Ambulatory Visit: Payer: Self-pay | Admitting: *Deleted

## 2016-01-10 MED ORDER — GABAPENTIN 600 MG PO TABS: 600.0000 mg | ORAL_TABLET | Freq: Two times a day (BID) | ORAL | 3 refills | Status: DC

## 2016-01-10 NOTE — Telephone Encounter (Signed)
Received call from [facility].   Reports that patient is out of Gabapentin, and mail order will take 2 weeks to arrive.   Requested prescription to [facility] sent to [facility] fill.   Prescription sent to pharmacy.   Call placed to patient and patient daughter [name] made aware.

## 2016-01-17 ENCOUNTER — Ambulatory Visit: Payer: Medicare Other | Admitting: Urology

## 2016-02-20 ENCOUNTER — Ambulatory Visit (INDEPENDENT_AMBULATORY_CARE_PROVIDER_SITE_OTHER): Payer: Medicare Other | Admitting: Family Medicine

## 2016-02-20 ENCOUNTER — Encounter: Payer: Self-pay | Admitting: Family Medicine

## 2016-02-20 VITALS — BP 110/70 | HR 78 | Temp 98.4°F | Resp 18 | Ht 64.5 in | Wt 205.0 lb

## 2016-02-20 DIAGNOSIS — G629 Polyneuropathy, unspecified: Secondary | ICD-10-CM | POA: Diagnosis not present

## 2016-02-20 DIAGNOSIS — I480 Paroxysmal atrial fibrillation: Secondary | ICD-10-CM | POA: Diagnosis not present

## 2016-02-20 DIAGNOSIS — E781 Pure hyperglyceridemia: Secondary | ICD-10-CM | POA: Diagnosis not present

## 2016-02-20 DIAGNOSIS — I10 Essential (primary) hypertension: Secondary | ICD-10-CM | POA: Diagnosis not present

## 2016-02-20 MED ORDER — HYDROCODONE-ACETAMINOPHEN 5-325 MG PO TABS: 1.0000 | ORAL_TABLET | Freq: Two times a day (BID) | ORAL | 0 refills | Status: DC

## 2016-02-20 MED ORDER — HYDROCODONE-ACETAMINOPHEN 5-325 MG PO TABS: 1.0000 | ORAL_TABLET | Freq: Three times a day (TID) | ORAL | 0 refills | Status: DC | PRN

## 2016-02-20 NOTE — Progress Notes (Signed)
Subjective:    Patient ID: Catherine Orozco, female    DOB: Sep 02, 1940, 75 y.o.   MRN: [id number]  HPI Her last colonosocpy was in 2015 and was normal.  She does not require a Pap smear as she has had a hysterectomy. She refuses a flu shot. She has no interest in Prevnar 13 or the shingles vaccine. She has a history of paroxysmal atrial fibrillation. Today she is in atrial fibrillation. Heart rate is approximately 80 bpm. She denies any chest pain shortness of breath or dyspnea on exertion. She denies any syncope or presyncope. She is appropriately anticoagulated. She denies any strokelike symptoms. Her blood pressure is excellent. She is no longer taking amlodipine. She is overdue for fasting lab work to monitor her cholesterol however she is not fasting today. Her biggest concern is worsening pain in both legs. She has peripheral neuropathy in both legs. At the present time she is taking Vicodin twice a day for the pain in her legs. However the pain is so severe that she basically just sits at home in a chair and does nothing. Her daughter states that she will not leave the house due to the pain in her legs because the pain is exacerbated by prolonged standing and walking. She will not even cook at home anymore. This is causing a deterioration in her functioning as she is no longer interacting with other people or visiting or staying active. She is requesting an increase frequency and pain medication Past Medical History:  Diagnosis Date  . A-fib ([place])   . Anxiety   . Arrhythmia    afib  . Arthritis   . Atrial fibrillation ([place]) 12/23/2012   Overview:  Last Assessment & Plan:  Maintaining normal sinus rhythm. Suggested she continue on anticoagulation and metoprolol   . BP (high blood pressure) 10/25/2015  . Calculus of gallbladder 12/11/2013   Overview:  Last Assessment & Plan:  Acceptable risk for upcoming gallbladder surgery. Suggested she stop her anticoagulation 3 days prior to surgery. She is  maintaining normal sinus rhythm. This could be restarted when she is felt to be in appropriate anticoagulation candidate, would recommend at least 2 or 3 days post procedure   . 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 CKD (chronic kidney disease) stage 3, GFR 30-59 ml/min   . Colon polyps    due again 04/2017  . Depression   . Gallstones   . Hypertension   . Insomnia   . Neuropathy ([place])   . Peripheral neuropathy ([place])    no lumbar radiculopathy   Past Surgical History:  Procedure Laterality Date  . ABDOMINAL HYSTERECTOMY    . CHOLECYSTECTOMY N/A 01/12/2014   Procedure: LAPAROSCOPIC CHOLECYSTECTOMY ;  Surgeon: Coralie Keens, MD;  Location: [facility];  Service: General;  Laterality: N/A;   Current Outpatient Prescriptions on File Prior to Visit  Medication Sig Dispense Refill  . apixaban (ELIQUIS) 5 MG TABS tablet Take 1 tablet (5 mg total) by mouth 2 (two) times daily. 180 tablet 4  . diazepam (VALIUM) 5 MG tablet TAKE 1 TABLET BY MOUTH TWICE A DAY AS NEEDED 60 tablet 2  . fenofibrate (TRICOR) 145 MG tablet TAKE 1 TABLET (145 MG TOTAL) BY MOUTH DAILY. 90 tablet 2  . gabapentin (NEURONTIN) 600 MG tablet Take 1 tablet (600 mg total) by mouth 2 (two) times daily. 60 tablet 3  . hydrochlorothiazide (HYDRODIURIL) 25 MG tablet Take 1 tablet by mouth  daily 90 tablet 3  .  hydrOXYzine (ATARAX/VISTARIL) 25 MG tablet TAKE 1 TABLET BY MOUTH AT BEDTIME 30 tablet 2  . metoprolol tartrate (LOPRESSOR) 25 MG tablet Take 1 tablet (25 mg total) by mouth 2 (two) times daily. 180 tablet 3  . pyridOXINE (VITAMIN B-6) 100 MG tablet Take 100 mg by mouth daily.    . simvastatin (ZOCOR) 20 MG tablet Take 1 tablet (20 mg total) by mouth at bedtime. 90 tablet 3  . amLODipine (NORVASC) 5 MG tablet Take 5 mg by mouth daily. Reported on 10/25/2015     No current facility-administered medications on file prior to visit.    Allergies  Allergen Reactions  . Paxil [Paroxetine Hcl]     Can't sleep   Social History    Social History  . Marital status: Widowed    Spouse name: N/A  . Number of children: N/A  . Years of education: N/A   Occupational History  . Not on file.   Social History Main Topics  . Smoking status: Current Every Day Smoker    Packs/day: 1.00    Years: 55.00    Types: Cigarettes  . Smokeless tobacco: Current User  . Alcohol use No  . Drug use: No  . Sexual activity: Not on file   Other Topics Concern  . Not on file   Social History Narrative  . No narrative on file   No family history on file.    Review of Systems  All other systems reviewed and are negative.      Objective:   Physical Exam  Constitutional: She is oriented to person, place, and time. She appears well-developed and well-nourished. No distress.  HENT:  Head: Normocephalic and atraumatic.  Right Ear: External ear normal.  Left Ear: External ear normal.  Nose: Nose normal.  Mouth/Throat: Oropharynx is clear and moist. No oropharyngeal exudate.  Eyes: Conjunctivae and EOM are normal. Pupils are equal, round, and reactive to light. Right eye exhibits no discharge. Left eye exhibits no discharge. No scleral icterus.  Neck: Normal range of motion. Neck supple. No JVD present. No tracheal deviation present. No thyromegaly present.  Cardiovascular: Normal rate, regular rhythm, normal heart sounds and intact distal pulses.  Exam reveals no gallop and no friction rub.   No murmur heard. Pulmonary/Chest: Effort normal and breath sounds normal. No stridor. No respiratory distress. She has no wheezes. She has no rales. She exhibits no tenderness.  Abdominal: Soft. Bowel sounds are normal. She exhibits no distension and no mass. There is no tenderness. There is no rebound and no guarding.  Musculoskeletal: Normal range of motion. She exhibits no edema or tenderness.  Lymphadenopathy:    She has no cervical adenopathy.  Neurological: She is alert and oriented to person, place, and time. She has normal  reflexes. No cranial nerve deficit. She exhibits normal muscle tone. Coordination normal.  Skin: Skin is warm. No rash noted. She is not diaphoretic. No erythema. No pallor.  Psychiatric: She has a normal mood and affect. Her behavior is normal. Judgment and thought content normal.  Vitals reviewed.         Assessment & Plan:  Benign essential HTN - Plan: CBC with Differential/Platelet, COMPLETE METABOLIC PANEL WITH GFR  Paroxysmal atrial fibrillation (HCC)  Hypertriglyceridemia  Neuropathy (HCC) Her blood pressure is good. I will have the patient discontinue amlodipine. Continue eliquis and metoprolol at their current doses. The patient refuses a flu shot. While she is here and going to check a CBC and a CMP. I've  asked the patient to return fasting for fasting lipid panel to monitor her triglycerides. The present time she is on a combination of fenofibrate and simvastatin. Therefore I would like to check her liver function test to ensure no evidence of drug-induced hepatitis. Offer the patient a flu shot as well as Prevnar 13 and she declined both.I will try to treat her neuropathy more adequately. I will increase hydrocodone to 3 times a day. I will give her 90 tablets a month. I did give her a 3 month prescription each month dated individually to avoid having to come back every month to pick up the prescription.

## 2016-02-21 LAB — COMPLETE METABOLIC PANEL WITH GFR
ALT: 18 U/L (ref 6–29)
AST: 33 U/L (ref 10–35)
Albumin: 4.2 g/dL (ref 3.6–5.1)
Alkaline Phosphatase: 53 U/L (ref 33–130)
BILIRUBIN TOTAL: 0.5 mg/dL (ref 0.2–1.2)
BUN: 21 mg/dL (ref 7–25)
CALCIUM: 9.9 mg/dL (ref 8.6–10.4)
CHLORIDE: 99 mmol/L (ref 98–110)
CO2: 24 mmol/L (ref 20–31)
CREATININE: 1.28 mg/dL — AB (ref 0.60–0.93)
GFR, EST AFRICAN AMERICAN: 48 mL/min — AB (ref >=60)
GFR, EST NON AFRICAN AMERICAN: 41 mL/min — AB (ref >=60)
Glucose, Bld: 121 mg/dL — ABNORMAL HIGH (ref 70–99)
Potassium: 4.3 mmol/L (ref 3.5–5.3)
Sodium: 136 mmol/L (ref 135–146)
TOTAL PROTEIN: 6.9 g/dL (ref 6.1–8.1)

## 2016-02-21 LAB — CBC WITH DIFFERENTIAL/PLATELET
BASOS ABS: 78 {cells}/uL (ref 0–200)
BASOS PCT: 1 %
EOS ABS: 156 {cells}/uL (ref 15–500)
Eosinophils Relative: 2 %
HEMATOCRIT: 48.9 % — AB (ref 35.0–45.0)
Hemoglobin: 17.2 g/dL — ABNORMAL HIGH (ref 12.0–15.0)
LYMPHS PCT: 32 %
Lymphs Abs: 2496 cells/uL (ref 850–3900)
MCH: 31.2 pg (ref 27.0–33.0)
MCHC: 35.2 g/dL (ref 32.0–36.0)
MCV: 88.6 fL (ref 80.0–100.0)
MONO ABS: 546 {cells}/uL (ref 200–950)
MONOS PCT: 7 %
MPV: 10.3 fL (ref 7.5–12.5)
NEUTROS PCT: 58 %
Neutro Abs: 4524 cells/uL (ref 1500–7800)
PLATELETS: 236 10*3/uL (ref 140–400)
RBC: 5.52 MIL/uL — ABNORMAL HIGH (ref 3.80–5.10)
RDW: 13.6 % (ref 11.0–15.0)
WBC: 7.8 10*3/uL (ref 3.8–10.8)

## 2016-02-27 ENCOUNTER — Other Ambulatory Visit: Payer: Self-pay | Admitting: Family Medicine

## 2016-03-10 ENCOUNTER — Other Ambulatory Visit: Payer: Self-pay | Admitting: Family Medicine

## 2016-03-15 ENCOUNTER — Other Ambulatory Visit: Payer: Self-pay | Admitting: Family Medicine

## 2016-04-18 ENCOUNTER — Telehealth: Payer: Self-pay | Admitting: Family Medicine

## 2016-04-18 MED ORDER — APIXABAN 5 MG PO TABS: 5.0000 mg | ORAL_TABLET | Freq: Two times a day (BID) | ORAL | 4 refills | Status: DC

## 2016-04-18 NOTE — Telephone Encounter (Signed)
Received paperwork for pt assistance - filled out and place on Dr. Antony Contras desk for signature along with rx

## 2016-04-19 NOTE — Telephone Encounter (Signed)
Faxed all paperwork and [facility]

## 2016-05-10 ENCOUNTER — Other Ambulatory Visit: Payer: Self-pay | Admitting: Family Medicine

## 2016-05-10 NOTE — Telephone Encounter (Signed)
Ok to refill 

## 2016-05-10 NOTE — Telephone Encounter (Signed)
ok 

## 2016-05-11 NOTE — Telephone Encounter (Signed)
Medication called to pharmacy. 

## 2016-05-16 ENCOUNTER — Other Ambulatory Visit: Payer: Self-pay | Admitting: Physician Assistant

## 2016-05-22 ENCOUNTER — Telehealth: Payer: Self-pay | Admitting: Family Medicine

## 2016-05-22 MED ORDER — HYDROCODONE-ACETAMINOPHEN 5-325 MG PO TABS: 1.0000 | ORAL_TABLET | Freq: Three times a day (TID) | ORAL | 0 refills | Status: DC | PRN

## 2016-05-22 NOTE — Telephone Encounter (Signed)
Patient daughter calling to get 3 month supply of hydrocodone if possible  [phone]

## 2016-05-22 NOTE — Telephone Encounter (Signed)
Okay 

## 2016-05-22 NOTE — Telephone Encounter (Signed)
Ok to refill 

## 2016-05-23 NOTE — Telephone Encounter (Signed)
RX printed x 3 months, left up front and patient's daughter aware to pick up via vm

## 2016-06-07 ENCOUNTER — Other Ambulatory Visit: Payer: Self-pay | Admitting: *Deleted

## 2016-06-07 MED ORDER — HYDROXYZINE HCL 25 MG PO TABS: 25.0000 mg | ORAL_TABLET | Freq: Every day | ORAL | 1 refills | Status: DC

## 2016-06-07 NOTE — Telephone Encounter (Signed)
Received fax requesting refill on atarax.   Refill appropriate and filled per protocol.

## 2016-07-04 ENCOUNTER — Other Ambulatory Visit: Payer: Self-pay | Admitting: Family Medicine

## 2016-07-04 MED ORDER — HYDROXYZINE HCL 25 MG PO TABS: 25.0000 mg | ORAL_TABLET | Freq: Every day | ORAL | 1 refills | Status: DC

## 2016-07-24 ENCOUNTER — Ambulatory Visit: Payer: Medicare Other | Admitting: Family Medicine

## 2016-07-30 ENCOUNTER — Encounter: Payer: Self-pay | Admitting: Family Medicine

## 2016-07-30 ENCOUNTER — Ambulatory Visit (INDEPENDENT_AMBULATORY_CARE_PROVIDER_SITE_OTHER): Payer: Medicare Other | Admitting: Family Medicine

## 2016-07-30 VITALS — BP 100/62 | HR 70 | Temp 97.9°F | Resp 18 | Ht 64.5 in | Wt 202.0 lb

## 2016-07-30 DIAGNOSIS — M545 Low back pain, unspecified: Secondary | ICD-10-CM

## 2016-07-30 LAB — URINALYSIS, MICROSCOPIC ONLY
CRYSTALS: NONE SEEN [HPF]
Casts: NONE SEEN [LPF]
RBC / HPF: NONE SEEN RBC/HPF (ref <=2)
Yeast: NONE SEEN [HPF]

## 2016-07-30 LAB — URINALYSIS, ROUTINE W REFLEX MICROSCOPIC
BILIRUBIN URINE: NEGATIVE
Glucose, UA: NEGATIVE
HGB URINE DIPSTICK: NEGATIVE
KETONES UR: NEGATIVE
Nitrite: NEGATIVE
Protein, ur: NEGATIVE
SPECIFIC GRAVITY, URINE: 1.025 (ref 1.001–1.035)
pH: 5 (ref 5.0–8.0)

## 2016-07-30 MED ORDER — HYDROCODONE-ACETAMINOPHEN 5-325 MG PO TABS: 1.0000 | ORAL_TABLET | Freq: Three times a day (TID) | ORAL | 0 refills | Status: DC | PRN

## 2016-07-30 MED ORDER — CEPHALEXIN 500 MG PO CAPS: 500.0000 mg | ORAL_CAPSULE | Freq: Three times a day (TID) | ORAL | 0 refills | Status: DC

## 2016-07-30 NOTE — Progress Notes (Signed)
Subjective:    Patient ID: Catherine Orozco, female    DOB: Jul 02, 1940, 76 y.o.   MRN: [id number]  Medication Refill   02/2016 Her last colonosocpy was in 2015 and was normal.  She does not require a Pap smear as she has had a hysterectomy. She refuses a flu shot. She has no interest in Prevnar 13 or the shingles vaccine. She has a history of paroxysmal atrial fibrillation. Today she is in atrial fibrillation. Heart rate is approximately 80 bpm. She denies any chest pain shortness of breath or dyspnea on exertion. She denies any syncope or presyncope. She is appropriately anticoagulated. She denies any strokelike symptoms. Her blood pressure is excellent. She is no longer taking amlodipine. She is overdue for fasting lab work to monitor her cholesterol however she is not fasting today. Her biggest concern is worsening pain in both legs. She has peripheral neuropathy in both legs. At the present time she is taking Vicodin twice a day for the pain in her legs. However the pain is so severe that she basically just sits at home in a chair and does nothing. Her daughter states that she will not leave the house due to the pain in her legs because the pain is exacerbated by prolonged standing and walking. She will not even cook at home anymore. This is causing a deterioration in her functioning as she is no longer interacting with other people or visiting or staying active. She is requesting an increase frequency and pain medication.  At that time, my plan was: Her blood pressure is good. I will have the patient discontinue amlodipine. Continue eliquis and metoprolol at their current doses. The patient refuses a flu shot. While she is here and going to check a CBC and a CMP. I've asked the patient to return fasting for fasting lipid panel to monitor her triglycerides. The present time she is on a combination of fenofibrate and simvastatin. Therefore I would like to check her liver function test to ensure no  evidence of drug-induced hepatitis. Offer the patient a flu shot as well as Prevnar 13 and she declined both.I will try to treat her neuropathy more adequately. I will increase hydrocodone to 3 times a day. I will give her 90 tablets a month. I did give her a 3 month prescription each month dated individually to avoid having to come back every month to pick up the prescription.  07/30/16 Patient states that she wants to have her kidneys checked. She reports polyuria. She denies any dysuria. She denies any hesitancy. She does complain of frequency. She denies any fevers or chills. She denies any nausea or vomiting. She does have a diffuse suprapubic pressure. This does not exacerbated or improved by urination. She does have chronic low back pain but nowhere near her kidneys. She has no CVA tenderness. She denies any constipation. She denies any blood in her stool or black tarry stools. She denies any vaginal discharge. Of note, she is taking 90 tablets a month hydrocodone for chronic low back and leg pain. Past Medical History:  Diagnosis Date  . A-fib ([place])   . Anxiety   . Arrhythmia    afib  . Arthritis   . Atrial fibrillation ([place]) 12/23/2012   Overview:  Last Assessment & Plan:  Maintaining normal sinus rhythm. Suggested she continue on anticoagulation and metoprolol   . BP (high blood pressure) 10/25/2015  . Calculus of gallbladder 12/11/2013   Overview:  Last Assessment & Plan:  Acceptable risk for upcoming gallbladder surgery. Suggested she stop her anticoagulation 3 days prior to surgery. She is maintaining normal sinus rhythm. This could be restarted when she is felt to be in appropriate anticoagulation candidate, would recommend at least 2 or 3 days post procedure   . 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 CKD (chronic kidney disease) stage 3, GFR 30-59 ml/min   . Colon polyps    due again 04/2017  . Depression   . Gallstones   . Hypertension   . Insomnia   . Neuropathy ([place])   .  Peripheral neuropathy ([place])    no lumbar radiculopathy   Past Surgical History:  Procedure Laterality Date  . ABDOMINAL HYSTERECTOMY    . CHOLECYSTECTOMY N/A 01/12/2014   Procedure: LAPAROSCOPIC CHOLECYSTECTOMY ;  Surgeon: Coralie Keens, MD;  Location: [facility];  Service: General;  Laterality: N/A;   Current Outpatient Prescriptions on File Prior to Visit  Medication Sig Dispense Refill  . amLODipine (NORVASC) 5 MG tablet Take 5 mg by mouth daily. Reported on 10/25/2015    . apixaban (ELIQUIS) 5 MG TABS tablet Take 1 tablet (5 mg total) by mouth 2 (two) times daily. 180 tablet 4  . diazepam (VALIUM) 5 MG tablet TAKE 1 TABLET BY MOUTH TWICE A DAY AS NEEDED FOR ANXIETY 60 tablet 2  . fenofibrate (TRICOR) 145 MG tablet TAKE 1 TABLET (145 MG TOTAL) BY MOUTH DAILY. 90 tablet 1  . gabapentin (NEURONTIN) 600 MG tablet Take 1 tablet (600 mg total) by mouth 2 (two) times daily. 60 tablet 3  . hydrochlorothiazide (HYDRODIURIL) 25 MG tablet Take 1 tablet by mouth  daily 90 tablet 3  . HYDROcodone-acetaminophen (NORCO/VICODIN) 5-325 MG tablet Take 1 tablet by mouth 3 (three) times daily as needed for moderate pain. 90 tablet 0  . hydrOXYzine (ATARAX/VISTARIL) 25 MG tablet Take 1 tablet (25 mg total) by mouth at bedtime. 90 tablet 1  . metoprolol tartrate (LOPRESSOR) 25 MG tablet TAKE 1 TABLET BY MOUTH TWO  TIMES DAILY 180 tablet 1  . pyridOXINE (VITAMIN B-6) 100 MG tablet Take 100 mg by mouth daily.    . simvastatin (ZOCOR) 20 MG tablet Take 1 tablet (20 mg total) by mouth at bedtime. 90 tablet 3   No current facility-administered medications on file prior to visit.    Allergies  Allergen Reactions  . Paxil [Paroxetine Hcl]     Can't sleep   Social History   Social History  . Marital status: Widowed    Spouse name: N/A  . Number of children: N/A  . Years of education: N/A   Occupational History  . Not on file.   Social History Main Topics  . Smoking status: Current Every Day Smoker     Packs/day: 1.00    Years: 55.00    Types: Cigarettes  . Smokeless tobacco: Current User  . Alcohol use No  . Drug use: No  . Sexual activity: Not on file   Other Topics Concern  . Not on file   Social History Narrative  . No narrative on file   No family history on file.    Review of Systems  All other systems reviewed and are negative.      Objective:   Physical Exam  Constitutional: She appears well-developed and well-nourished. No distress.  HENT:  Head: Normocephalic and atraumatic.  Right Ear: External ear normal.  Left Ear: External ear normal.  Nose: Nose normal.  Mouth/Throat: Oropharynx is clear  and moist. No oropharyngeal exudate.  Eyes: Conjunctivae and EOM are normal. Pupils are equal, round, and reactive to light.  Neck: Neck supple. No JVD present. No thyromegaly present.  Cardiovascular: Normal rate, regular rhythm and normal heart sounds.  Exam reveals no gallop and no friction rub.   No murmur heard. Pulmonary/Chest: Effort normal and breath sounds normal. No respiratory distress. She has no wheezes. She has no rales. She exhibits no tenderness.  Abdominal: Soft. Bowel sounds are normal. She exhibits no distension and no mass. There is tenderness. There is no rebound and no guarding.  Skin: She is not diaphoretic.  Vitals reviewed.         Assessment & Plan:  Low back pain without sciatica, unspecified back pain laterality, unspecified chronicity - Plan: Urinalysis, Routine w reflex microscopic I do not believe the patient's polyuria, abdominal pressure, and low back pain are necessarily attributable to urinary tract infection. I will gladly check a urinalysis given the patient's history of frequent urinary tract infections. However I believe the abdominal pressure is likely related to chronic narcotic use and possibly some constipation. However patient is adamant that she is not constipated.  Urinalysis shows +1 leukocyte esterase. I will treat the  patient with Keflex 500 mg by mouth 3 times a day for 5 days and send a urine culture. If culture is negative, and her symptoms do not improve, I will proceed with an abdominal x-ray to evaluate for constipation.  I also refilled her hydrocodone 90 tablets per month for 3 months area and she will not be due for repeat refill until July 9

## 2016-07-31 LAB — CBC WITH DIFFERENTIAL/PLATELET
BASOS PCT: 0 %
Basophils Absolute: 0 cells/uL (ref 0–200)
EOS ABS: 91 {cells}/uL (ref 15–500)
Eosinophils Relative: 1 %
HEMATOCRIT: 48.4 % — AB (ref 35.0–45.0)
Hemoglobin: 16.8 g/dL — ABNORMAL HIGH (ref 12.0–15.0)
LYMPHS PCT: 32 %
Lymphs Abs: 2912 cells/uL (ref 850–3900)
MCH: 30.9 pg (ref 27.0–33.0)
MCHC: 34.7 g/dL (ref 32.0–36.0)
MCV: 89 fL (ref 80.0–100.0)
MONO ABS: 637 {cells}/uL (ref 200–950)
MPV: 10.4 fL (ref 7.5–12.5)
Monocytes Relative: 7 %
NEUTROS PCT: 60 %
Neutro Abs: 5460 cells/uL (ref 1500–7800)
Platelets: 233 10*3/uL (ref 140–400)
RBC: 5.44 MIL/uL — ABNORMAL HIGH (ref 3.80–5.10)
RDW: 13.7 % (ref 11.0–15.0)
WBC: 9.1 10*3/uL (ref 3.8–10.8)

## 2016-07-31 LAB — COMPLETE METABOLIC PANEL WITH GFR
ALBUMIN: 4.2 g/dL (ref 3.6–5.1)
ALK PHOS: 56 U/L (ref 33–130)
ALT: 17 U/L (ref 6–29)
AST: 28 U/L (ref 10–35)
BUN: 25 mg/dL (ref 7–25)
CALCIUM: 9.9 mg/dL (ref 8.6–10.4)
CHLORIDE: 99 mmol/L (ref 98–110)
CO2: 25 mmol/L (ref 20–31)
CREATININE: 1.29 mg/dL — AB (ref 0.60–0.93)
GFR, EST AFRICAN AMERICAN: 47 mL/min — AB (ref >=60)
GFR, Est Non African American: 41 mL/min — ABNORMAL LOW (ref >=60)
Glucose, Bld: 114 mg/dL — ABNORMAL HIGH (ref 70–99)
Potassium: 4.1 mmol/L (ref 3.5–5.3)
Sodium: 137 mmol/L (ref 135–146)
Total Bilirubin: 0.5 mg/dL (ref 0.2–1.2)
Total Protein: 6.8 g/dL (ref 6.1–8.1)

## 2016-08-01 ENCOUNTER — Telehealth: Payer: Self-pay | Admitting: Family Medicine

## 2016-08-01 NOTE — Telephone Encounter (Signed)
Spoke to daughter and pt is having a lot of pain even taking the Gabapentin and her question was can we increase this med or do you think Lyrica would be a better choice and if so she can try to get pt assistance?

## 2016-08-02 LAB — CULTURE, URINE COMPREHENSIVE

## 2016-08-02 NOTE — Telephone Encounter (Signed)
I do think lyrica would be a better option for her and I would start at 75 mg potid and stop gabapentin.

## 2016-08-02 NOTE — Telephone Encounter (Signed)
Pt's daughter aware and will get back with me when is gets the paperwork for the lyrica patient assistance.

## 2016-09-06 ENCOUNTER — Encounter: Payer: Self-pay | Admitting: Family Medicine

## 2016-09-06 ENCOUNTER — Ambulatory Visit (INDEPENDENT_AMBULATORY_CARE_PROVIDER_SITE_OTHER): Payer: Medicare Other | Admitting: Family Medicine

## 2016-09-06 VITALS — BP 100/60 | HR 86 | Temp 97.8°F | Resp 18 | Ht 64.5 in | Wt 202.0 lb

## 2016-09-06 DIAGNOSIS — R300 Dysuria: Secondary | ICD-10-CM

## 2016-09-06 DIAGNOSIS — G309 Alzheimer's disease, unspecified: Secondary | ICD-10-CM

## 2016-09-06 DIAGNOSIS — F0280 Dementia in other diseases classified elsewhere without behavioral disturbance: Secondary | ICD-10-CM

## 2016-09-06 LAB — URINALYSIS, ROUTINE W REFLEX MICROSCOPIC
BILIRUBIN URINE: NEGATIVE
GLUCOSE, UA: NEGATIVE
KETONES UR: NEGATIVE
Nitrite: NEGATIVE
PH: 5 (ref 5.0–8.0)
Protein, ur: NEGATIVE
SPECIFIC GRAVITY, URINE: 1.02 (ref 1.001–1.035)

## 2016-09-06 LAB — URINALYSIS, MICROSCOPIC ONLY
CASTS: NONE SEEN [LPF]
CRYSTALS: NONE SEEN [HPF]
Yeast: NONE SEEN [HPF]

## 2016-09-06 MED ORDER — CIPROFLOXACIN HCL 500 MG PO TABS: 500.0000 mg | ORAL_TABLET | Freq: Two times a day (BID) | ORAL | 0 refills | Status: DC

## 2016-09-06 MED ORDER — DONEPEZIL HCL 5 MG PO TABS: 5.0000 mg | ORAL_TABLET | Freq: Every day | ORAL | 3 refills | Status: DC

## 2016-09-06 NOTE — Progress Notes (Signed)
Subjective:    Patient ID: Catherine Orozco, female    DOB: 1940/08/16, 76 y.o.   MRN: [id number]  Medication Refill   02/2016 Her last colonosocpy was in 2015 and was normal.  She does not require a Pap smear as she has had a hysterectomy. She refuses a flu shot. She has no interest in Prevnar 13 or the shingles vaccine. She has a history of paroxysmal atrial fibrillation. Today she is in atrial fibrillation. Heart rate is approximately 80 bpm. She denies any chest pain shortness of breath or dyspnea on exertion. She denies any syncope or presyncope. She is appropriately anticoagulated. She denies any strokelike symptoms. Her blood pressure is excellent. She is no longer taking amlodipine. She is overdue for fasting lab work to monitor her cholesterol however she is not fasting today. Her biggest concern is worsening pain in both legs. She has peripheral neuropathy in both legs. At the present time she is taking Vicodin twice a day for the pain in her legs. However the pain is so severe that she basically just sits at home in a chair and does nothing. Her daughter states that she will not leave the house due to the pain in her legs because the pain is exacerbated by prolonged standing and walking. She will not even cook at home anymore. This is causing a deterioration in her functioning as she is no longer interacting with other people or visiting or staying active. She is requesting an increase frequency and pain medication.  At that time, my plan was: Her blood pressure is good. I will have the patient discontinue amlodipine. Continue eliquis and metoprolol at their current doses. The patient refuses a flu shot. While she is here and going to check a CBC and a CMP. I've asked the patient to return fasting for fasting lipid panel to monitor her triglycerides. The present time she is on a combination of fenofibrate and simvastatin. Therefore I would like to check her liver function test to ensure no  evidence of drug-induced hepatitis. Offer the patient a flu shot as well as Prevnar 13 and she declined both.I will try to treat her neuropathy more adequately. I will increase hydrocodone to 3 times a day. I will give her 90 tablets a month. I did give her a 3 month prescription each month dated individually to avoid having to come back every month to pick up the prescription.  07/30/16 Patient states that she wants to have her kidneys checked. She reports polyuria. She denies any dysuria. She denies any hesitancy. She does complain of frequency. She denies any fevers or chills. She denies any nausea or vomiting. She does have a diffuse suprapubic pressure. This does not exacerbated or improved by urination. She does have chronic low back pain but nowhere near her kidneys. She has no CVA tenderness. She denies any constipation. She denies any blood in her stool or black tarry stools. She denies any vaginal discharge. Of note, she is taking 90 tablets a month hydrocodone for chronic low back and leg pain.  At that time, my plan was: I do not believe the patient's polyuria, abdominal pressure, and low back pain are necessarily attributable to urinary tract infection. I will gladly check a urinalysis given the patient's history of frequent urinary tract infections. However I believe the abdominal pressure is likely related to chronic narcotic use and possibly some constipation. However patient is adamant that she is not constipated.  Urinalysis shows +1 leukocyte esterase. I  will treat the patient with Keflex 500 mg by mouth 3 times a day for 5 days and send a urine culture. If culture is negative, and her symptoms do not improve, I will proceed with an abdominal x-ray to evaluate for constipation.  I also refilled her hydrocodone 90 tablets per month for 3 months area and she will not be due for repeat refill until July 9  09/06/16 After the patient's last visit, urine culture confirmed urinary tract infection  and she was treated with antibiotics. She is here today again complaining of dysuria, urgency, and frequency. I became concerned as the frequency of her bladder infections. Therefore I asked for further clarification questions to determine the frequency and onset of symptoms. Patient was unable to provide me with this information. She seemed very confused. Therefore I performed a Mini-Mental status exam. Patient was unable to tell me the date. She incorrectly told me the year, the month, the day of the week, and even the season. She is able to tell me the location of his office. She is able to tell me 2 out of 3 objects on recall. She is unable to perform world or serial sevens. Therefore she scored 19 out of 30 on Mini-Mental status exam. I asked the patient to perform the clock drawing exercise. She is unable to draw any hands on the clock or correctly demonstrate the time. She draws a circle and then writes 12, 12:30, 1, 1:30 in sequential order from the 12 position to the 6 position.  Furthermore the numbers are not even incorrectly written on the clock but written to the side. Past Medical History:  Diagnosis Date  . A-fib ([place])   . Anxiety   . Arrhythmia    afib  . Arthritis   . Atrial fibrillation ([place]) 12/23/2012   Overview:  Last Assessment & Plan:  Maintaining normal sinus rhythm. Suggested she continue on anticoagulation and metoprolol   . BP (high blood pressure) 10/25/2015  . Calculus of gallbladder 12/11/2013   Overview:  Last Assessment & Plan:  Acceptable risk for upcoming gallbladder surgery. Suggested she stop her anticoagulation 3 days prior to surgery. She is maintaining normal sinus rhythm. This could be restarted when she is felt to be in appropriate anticoagulation candidate, would recommend at least 2 or 3 days post procedure   . 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 CKD (chronic kidney disease) stage 3, GFR 30-59 ml/min   . Colon polyps    due again 04/2017  . Depression   .  Gallstones   . Hypertension   . Insomnia   . Neuropathy   . Peripheral neuropathy    no lumbar radiculopathy   Past Surgical History:  Procedure Laterality Date  . ABDOMINAL HYSTERECTOMY    . CHOLECYSTECTOMY N/A 01/12/2014   Procedure: LAPAROSCOPIC CHOLECYSTECTOMY ;  Surgeon: Coralie Keens, MD;  Location: [place];  Service: General;  Laterality: N/A;   Current Outpatient Prescriptions on File Prior to Visit  Medication Sig Dispense Refill  . amLODipine (NORVASC) 5 MG tablet Take 5 mg by mouth daily. Reported on 10/25/2015    . apixaban (ELIQUIS) 5 MG TABS tablet Take 1 tablet (5 mg total) by mouth 2 (two) times daily. 180 tablet 4  . cephALEXin (KEFLEX) 500 MG capsule Take 1 capsule (500 mg total) by mouth 3 (three) times daily. 15 capsule 0  . diazepam (VALIUM) 5 MG tablet TAKE 1 TABLET BY MOUTH TWICE A DAY AS NEEDED FOR  ANXIETY 60 tablet 2  . fenofibrate (TRICOR) 145 MG tablet TAKE 1 TABLET (145 MG TOTAL) BY MOUTH DAILY. 90 tablet 1  . gabapentin (NEURONTIN) 600 MG tablet Take 1 tablet (600 mg total) by mouth 2 (two) times daily. 60 tablet 3  . hydrochlorothiazide (HYDRODIURIL) 25 MG tablet Take 1 tablet by mouth  daily 90 tablet 3  . HYDROcodone-acetaminophen (NORCO/VICODIN) 5-325 MG tablet Take 1 tablet by mouth 3 (three) times daily as needed for moderate pain. 90 tablet 0  . hydrOXYzine (ATARAX/VISTARIL) 25 MG tablet Take 1 tablet (25 mg total) by mouth at bedtime. 90 tablet 1  . metoprolol tartrate (LOPRESSOR) 25 MG tablet TAKE 1 TABLET BY MOUTH TWO  TIMES DAILY 180 tablet 1  . pyridOXINE (VITAMIN B-6) 100 MG tablet Take 100 mg by mouth daily.    . simvastatin (ZOCOR) 20 MG tablet Take 1 tablet (20 mg total) by mouth at bedtime. 90 tablet 3   No current facility-administered medications on file prior to visit.    Allergies  Allergen Reactions  . Paxil [Paroxetine Hcl]     Can't sleep   Social History   Social History  . Marital status: Widowed    Spouse name: N/A  .  Number of children: N/A  . Years of education: N/A   Occupational History  . Not on file.   Social History Main Topics  . Smoking status: Current Every Day Smoker    Packs/day: 1.00    Years: 55.00    Types: Cigarettes  . Smokeless tobacco: Current User  . Alcohol use No  . Drug use: No  . Sexual activity: Not on file   Other Topics Concern  . Not on file   Social History Narrative  . No narrative on file   No family history on file.    Review of Systems  All other systems reviewed and are negative.      Objective:   Physical Exam  Constitutional: She appears well-developed and well-nourished. No distress.  HENT:  Head: Normocephalic and atraumatic.  Right Ear: External ear normal.  Left Ear: External ear normal.  Nose: Nose normal.  Mouth/Throat: Oropharynx is clear and moist. No oropharyngeal exudate.  Eyes: Conjunctivae and EOM are normal. Pupils are equal, round, and reactive to light.  Neck: Neck supple. No JVD present. No thyromegaly present.  Cardiovascular: Normal rate, regular rhythm and normal heart sounds.  Exam reveals no gallop and no friction rub.   No murmur heard. Pulmonary/Chest: Effort normal and breath sounds normal. No respiratory distress. She has no wheezes. She has no rales. She exhibits no tenderness.  Abdominal: Soft. Bowel sounds are normal. She exhibits no distension and no mass. There is tenderness. There is no rebound and no guarding.  Neurological: She is alert. She has normal reflexes. She displays normal reflexes. No cranial nerve deficit. She exhibits normal muscle tone. Coordination normal.  Skin: She is not diaphoretic.  Psychiatric: Cognition and memory are impaired. She exhibits abnormal recent memory. She exhibits normal remote memory.  Vitals reviewed.         Assessment & Plan:  Burning with urination - Plan: Urinalysis, Routine w reflex microscopic  Alzheimer's dementia without behavioral disturbance, unspecified  timing of dementia onset Patient should not be driving. I asked the patient to start Aricept 5 mg by mouth daily and recheck in one month with me with her daughter present. Patient lives at home with her daughter who is her primary caretaker. I believe I  need to get more background information about what could possibly be happening. I would also like to obtain imaging of the brain to evaluate for possible stroke given her history of atrial fibrillation. Meanwhile treat her urinary tract infection with Cipro 500 mg by mouth twice a day for 7 days and send a urine culture

## 2016-09-08 LAB — URINE CULTURE

## 2016-09-12 ENCOUNTER — Other Ambulatory Visit: Payer: Self-pay | Admitting: Family Medicine

## 2016-09-27 ENCOUNTER — Other Ambulatory Visit: Payer: Self-pay | Admitting: Family Medicine

## 2016-10-03 ENCOUNTER — Ambulatory Visit (INDEPENDENT_AMBULATORY_CARE_PROVIDER_SITE_OTHER): Payer: Medicare Other | Admitting: Family Medicine

## 2016-10-03 ENCOUNTER — Encounter: Payer: Self-pay | Admitting: Family Medicine

## 2016-10-03 VITALS — BP 100/62 | HR 80 | Temp 97.9°F | Resp 20 | Ht 64.5 in | Wt 200.0 lb

## 2016-10-03 DIAGNOSIS — F0280 Dementia in other diseases classified elsewhere without behavioral disturbance: Secondary | ICD-10-CM

## 2016-10-03 DIAGNOSIS — G309 Alzheimer's disease, unspecified: Secondary | ICD-10-CM

## 2016-10-03 MED ORDER — MEMANTINE HCL 10 MG PO TABS: 10.0000 mg | ORAL_TABLET | Freq: Two times a day (BID) | ORAL | 5 refills | Status: DC

## 2016-10-03 NOTE — Progress Notes (Signed)
Subjective:    Patient ID: Catherine Orozco, female    DOB: Jul 10, 1940, 76 y.o.   MRN: [id number]  Medication Refill   09/06/16 After the patient's last visit, urine culture confirmed urinary tract infection and she was treated with antibiotics. She is here today again complaining of dysuria, urgency, and frequency. I became concerned as the frequency of her bladder infections. Therefore I asked for further clarification questions to determine the frequency and onset of symptoms. Patient was unable to provide me with this information. She seemed very confused. Therefore I performed a Mini-Mental status exam. Patient was unable to tell me the date. She incorrectly told me the year, the month, the day of the week, and even the season. She is able to tell me the location of his office. She is able to tell me 2 out of 3 objects on recall. She is unable to perform world or serial sevens. Therefore she scored 19 out of 30 on Mini-Mental status exam. I asked the patient to perform the clock drawing exercise. She is unable to draw any hands on the clock or correctly demonstrate the time. She draws a circle and then writes 12, 12:30, 1, 1:30 in sequential order from the 12 position to the 6 position.  Furthermore the numbers are not even incorrectly written on the clock but written to the side.  At that time, mmy plan was: At that time, my plan was: Patient should not be driving. I asked the patient to start Aricept 5 mg by mouth daily and recheck in one month with me with her daughter present. Patient lives at home with her daughter who is her primary caretaker. I believe I need to get more background information about what could possibly be happening. I would also like to obtain imaging of the brain to evaluate for possible stroke given her history of atrial fibrillation. Meanwhile treat her urinary tract infection with Cipro 500 mg by mouth twice a day for 7 days and send a urine culture    10/03/16 Patient  took one dose of Aricept and experienced nightmares that evening and refused to take any more. She is here today with her daughter. She continues to minimize her memory loss. However even her daughter endorses gradual slowly worsening memory loss. I repeated Mini-Mental Status exam today. Today the patient has a difficult time telling me the year. She can remember 2 out of 3 objects on recall. She is able to spell world today in reverse. Still unable to perform serial sevens. Clock drawing remains extremely abnormal and concerning. Patient is unable to appropriately draw a circle with the correct digits in the correct location. Nor is she able to draw the correct time. Today she scores 24 out of 30 on Mini-Mental status exam because she can perform spelling world in reverse which is different from last time. However her performance is still markedly abnormal Past Medical History:  Diagnosis Date  . A-fib ([place])   . Anxiety   . Arrhythmia    afib  . Arthritis   . Atrial fibrillation ([place]) 12/23/2012   Overview:  Last Assessment & Plan:  Maintaining normal sinus rhythm. Suggested she continue on anticoagulation and metoprolol   . BP (high blood pressure) 10/25/2015  . Calculus of gallbladder 12/11/2013   Overview:  Last Assessment & Plan:  Acceptable risk for upcoming gallbladder surgery. Suggested she stop her anticoagulation 3 days prior to surgery. She is maintaining normal sinus rhythm. This could be restarted when  she is felt to be in appropriate anticoagulation candidate, would recommend at least 2 or 3 days post procedure   . 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 CKD (chronic kidney disease) stage 3, GFR 30-59 ml/min   . Colon polyps    due again 04/2017  . Depression   . Gallstones   . Hypertension   . Insomnia   . Neuropathy   . Peripheral neuropathy    no lumbar radiculopathy   Past Surgical History:  Procedure Laterality Date  . ABDOMINAL HYSTERECTOMY    . CHOLECYSTECTOMY N/A  01/12/2014   Procedure: LAPAROSCOPIC CHOLECYSTECTOMY ;  Surgeon: Coralie Keens, MD;  Location: [facility];  Service: General;  Laterality: N/A;   Current Outpatient Prescriptions on File Prior to Visit  Medication Sig Dispense Refill  . apixaban (ELIQUIS) 5 MG TABS tablet Take 1 tablet (5 mg total) by mouth 2 (two) times daily. 180 tablet 4  . diazepam (VALIUM) 5 MG tablet TAKE 1 TABLET BY MOUTH TWICE A DAY AS NEEDED FOR ANXIETY 60 tablet 2  . donepezil (ARICEPT) 5 MG tablet Take 1 tablet (5 mg total) by mouth at bedtime. 30 tablet 3  . fenofibrate (TRICOR) 145 MG tablet TAKE 1 TABLET (145 MG TOTAL) BY MOUTH DAILY. 90 tablet 1  . gabapentin (NEURONTIN) 600 MG tablet Take 1 tablet (600 mg total) by mouth 2 (two) times daily. 60 tablet 3  . hydrochlorothiazide (HYDRODIURIL) 25 MG tablet Take 1 tablet by mouth  daily 90 tablet 3  . HYDROcodone-acetaminophen (NORCO/VICODIN) 5-325 MG tablet Take 1 tablet by mouth 3 (three) times daily as needed for moderate pain. 90 tablet 0  . hydrOXYzine (ATARAX/VISTARIL) 25 MG tablet Take 1 tablet (25 mg total) by mouth at bedtime. 90 tablet 1  . metoprolol tartrate (LOPRESSOR) 25 MG tablet TAKE 1 TABLET BY MOUTH TWO  TIMES DAILY 180 tablet 1  . pyridOXINE (VITAMIN B-6) 100 MG tablet Take 100 mg by mouth daily.    . simvastatin (ZOCOR) 20 MG tablet Take 1 tablet (20 mg total) by mouth at bedtime. 90 tablet 3   No current facility-administered medications on file prior to visit.    Allergies  Allergen Reactions  . Paxil [Paroxetine Hcl]     Can't sleep   Social History   Social History  . Marital status: Widowed    Spouse name: N/A  . Number of children: N/A  . Years of education: N/A   Occupational History  . Not on file.   Social History Main Topics  . Smoking status: Current Every Day Smoker    Packs/day: 1.00    Years: 55.00    Types: Cigarettes  . Smokeless tobacco: Current User  . Alcohol use No  . Drug use: No  . Sexual activity: Not on  file   Other Topics Concern  . Not on file   Social History Narrative  . No narrative on file   No family history on file.    Review of Systems  All other systems reviewed and are negative.      Objective:   Physical Exam  Constitutional: She appears well-developed and well-nourished. No distress.  HENT:  Head: Normocephalic and atraumatic.  Right Ear: External ear normal.  Left Ear: External ear normal.  Nose: Nose normal.  Mouth/Throat: Oropharynx is clear and moist. No oropharyngeal exudate.  Eyes: Conjunctivae and EOM are normal. Pupils are equal, round, and reactive to light.  Neck: Neck supple. No JVD present.  No thyromegaly present.  Cardiovascular: Normal rate, regular rhythm and normal heart sounds.  Exam reveals no gallop and no friction rub.   No murmur heard. Pulmonary/Chest: Effort normal and breath sounds normal. No respiratory distress. She has no wheezes. She has no rales. She exhibits no tenderness.  Abdominal: Soft. Bowel sounds are normal. She exhibits no distension and no mass. There is tenderness. There is no rebound and no guarding.  Neurological: She is alert. She has normal reflexes. No cranial nerve deficit. She exhibits normal muscle tone. Coordination normal.  Skin: She is not diaphoretic.  Psychiatric: Cognition and memory are impaired. She exhibits abnormal recent memory. She exhibits normal remote memory.  Vitals reviewed.         Assessment & Plan:  Alzheimer's dementia without behavioral disturbance, unspecified timing of dementia onset  Daughter agrees that the patient's memory is abnormal and her performance on Mini-Mental status exam today "eye opening."  Patient should no longer be driving. I reiterated that her daughter unable to tolerate Aricept and refuses to try again. Begin Namenda 10 mg by mouth twice a day. I then went over the medication list with the patient and her daughter. She is on numerous medications that can impair  judgment and memory including hydroxyzine for insomnia, gabapentin for neuropathic pain in her legs, hydrocodone for back pain and neuropathic pain in her legs, and Valium for anxiety. I strongly encouraged the patient and her daughter to gradually try to wean the patient away from these medications. Immediately discontinue hydroxyzine. Slowly try to wean the patient off of Valium at home. For the time being I will not change her gabapentin or hydrocodone as she does suffer from pain. However I want to limit the amount of centrally acting medication the patient is taking. Recheck the patient in 3 months.

## 2016-11-11 ENCOUNTER — Other Ambulatory Visit: Payer: Self-pay | Admitting: Family Medicine

## 2016-11-15 ENCOUNTER — Other Ambulatory Visit: Payer: Self-pay | Admitting: Family Medicine

## 2016-11-15 ENCOUNTER — Ambulatory Visit (INDEPENDENT_AMBULATORY_CARE_PROVIDER_SITE_OTHER): Payer: Medicare Other | Admitting: Family Medicine

## 2016-11-15 ENCOUNTER — Encounter: Payer: Self-pay | Admitting: Family Medicine

## 2016-11-15 VITALS — BP 100/74 | HR 84 | Temp 98.2°F | Resp 20 | Ht 64.5 in | Wt 196.0 lb

## 2016-11-15 DIAGNOSIS — G309 Alzheimer's disease, unspecified: Secondary | ICD-10-CM

## 2016-11-15 DIAGNOSIS — Z9119 Patient's noncompliance with other medical treatment and regimen: Secondary | ICD-10-CM

## 2016-11-15 DIAGNOSIS — F0280 Dementia in other diseases classified elsewhere without behavioral disturbance: Secondary | ICD-10-CM | POA: Diagnosis not present

## 2016-11-15 DIAGNOSIS — Z91199 Patient's noncompliance with other medical treatment and regimen due to unspecified reason: Secondary | ICD-10-CM

## 2016-11-15 DIAGNOSIS — E785 Hyperlipidemia, unspecified: Secondary | ICD-10-CM

## 2016-11-15 DIAGNOSIS — E119 Type 2 diabetes mellitus without complications: Secondary | ICD-10-CM | POA: Diagnosis not present

## 2016-11-16 LAB — CBC WITH DIFFERENTIAL/PLATELET
BASOS ABS: 0 {cells}/uL (ref 0–200)
Basophils Relative: 0 %
EOS PCT: 1 %
Eosinophils Absolute: 77 cells/uL (ref 15–500)
HCT: 49.2 % — ABNORMAL HIGH (ref 35.0–45.0)
Hemoglobin: 17.1 g/dL — ABNORMAL HIGH (ref 12.0–15.0)
Lymphocytes Relative: 31 %
Lymphs Abs: 2387 cells/uL (ref 850–3900)
MCH: 31.4 pg (ref 27.0–33.0)
MCHC: 34.8 g/dL (ref 32.0–36.0)
MCV: 90.4 fL (ref 80.0–100.0)
MONOS PCT: 6 %
MPV: 10.9 fL (ref 7.5–12.5)
Monocytes Absolute: 462 cells/uL (ref 200–950)
NEUTROS ABS: 4774 {cells}/uL (ref 1500–7800)
Neutrophils Relative %: 62 %
PLATELETS: 249 10*3/uL (ref 140–400)
RBC: 5.44 MIL/uL — AB (ref 3.80–5.10)
RDW: 13.3 % (ref 11.0–15.0)
WBC: 7.7 10*3/uL (ref 3.8–10.8)

## 2016-11-16 LAB — LIPID PANEL
CHOL/HDL RATIO: 3.8 ratio (ref <5.0)
Cholesterol: 125 mg/dL (ref <200)
HDL: 33 mg/dL — AB (ref >50)
Triglycerides: 429 mg/dL — ABNORMAL HIGH (ref <150)

## 2016-11-16 LAB — COMPLETE METABOLIC PANEL WITH GFR
ALT: 16 U/L (ref 6–29)
AST: 30 U/L (ref 10–35)
Albumin: 4.4 g/dL (ref 3.6–5.1)
Alkaline Phosphatase: 55 U/L (ref 33–130)
BUN: 21 mg/dL (ref 7–25)
CO2: 18 mmol/L — AB (ref 20–31)
Calcium: 9.6 mg/dL (ref 8.6–10.4)
Chloride: 103 mmol/L (ref 98–110)
Creat: 1.29 mg/dL — ABNORMAL HIGH (ref 0.60–0.93)
GFR, EST NON AFRICAN AMERICAN: 41 mL/min — AB (ref >=60)
GFR, Est African American: 47 mL/min — ABNORMAL LOW (ref >=60)
GLUCOSE: 147 mg/dL — AB (ref 70–99)
POTASSIUM: 4 mmol/L (ref 3.5–5.3)
SODIUM: 138 mmol/L (ref 135–146)
TOTAL PROTEIN: 6.8 g/dL (ref 6.1–8.1)
Total Bilirubin: 0.4 mg/dL (ref 0.2–1.2)

## 2016-11-16 NOTE — Progress Notes (Signed)
Subjective:    Patient ID: Catherine Orozco, female    DOB: 1940/12/06, 76 y.o.   MRN: [id number]  Medication Refill   09/06/16 After the patient's last visit, urine culture confirmed urinary tract infection and she was treated with antibiotics. She is here today again complaining of dysuria, urgency, and frequency. I became concerned as the frequency of her bladder infections. Therefore I asked for further clarification questions to determine the frequency and onset of symptoms. Patient was unable to provide me with this information. She seemed very confused. Therefore I performed a Mini-Mental status exam. Patient was unable to tell me the date. She incorrectly told me the year, the month, the day of the week, and even the season. She is able to tell me the location of his office. She is able to tell me 2 out of 3 objects on recall. She is unable to perform world or serial sevens. Therefore she scored 19 out of 30 on Mini-Mental status exam. I asked the patient to perform the clock drawing exercise. She is unable to draw any hands on the clock or correctly demonstrate the time. She draws a circle and then writes 12, 12:30, 1, 1:30 in sequential order from the 12 position to the 6 position.  Furthermore the numbers are not even incorrectly written on the clock but written to the side.  At that time, mmy plan was: At that time, my plan was: Patient should not be driving. I asked the patient to start Aricept 5 mg by mouth daily and recheck in one month with me with her daughter present. Patient lives at home with her daughter who is her primary caretaker. I believe I need to get more background information about what could possibly be happening. I would also like to obtain imaging of the brain to evaluate for possible stroke given her history of atrial fibrillation. Meanwhile treat her urinary tract infection with Cipro 500 mg by mouth twice a day for 7 days and send a urine culture    10/03/16 Patient  took one dose of Aricept and experienced nightmares that evening and refused to take any more. She is here today with her daughter. She continues to minimize her memory loss. However even her daughter endorses gradual slowly worsening memory loss. I repeated Mini-Mental Status exam today. Today the patient has a difficult time telling me the year. She can remember 2 out of 3 objects on recall. She is able to spell world today in reverse. Still unable to perform serial sevens. Clock drawing remains extremely abnormal and concerning. Patient is unable to appropriately draw a circle with the correct digits in the correct location. Nor is she able to draw the correct time. Today she scores 24 out of 30 on Mini-Mental status exam because she can perform spelling world in reverse which is different from last time. However her performance is still markedly abnormal.  AT that time, my plan was: Daughter agrees that the patient's memory is abnormal and her performance on Mini-Mental status exam today "eye opening."  Patient should no longer be driving. I reiterated that her daughter unable to tolerate Aricept and refuses to try again. Begin Namenda 10 mg by mouth twice a day. I then went over the medication list with the patient and her daughter. She is on numerous medications that can impair judgment and memory including hydroxyzine for insomnia, gabapentin for neuropathic pain in her legs, hydrocodone for back pain and neuropathic pain in her legs, and Valium  for anxiety. I strongly encouraged the patient and her daughter to gradually try to wean the patient away from these medications. Immediately discontinue hydroxyzine. Slowly try to wean the patient off of Valium at home. For the time being I will not change her gabapentin or hydrocodone as she does suffer from pain. However I want to limit the amount of centrally acting medication the patient is taking. Recheck the patient in 3 months.  11/16/16 Patient is here  today having driven herself by herself. This is against my medical recommendation last time. Her daughter is apparently at work. Obviously the patient is still driving. She is here today for a medicine check. However the patient is unable to tell me any of the names the medication she's taking. She is unable to tell me how many times a day she takes many of her medications. Several of her medications are extremely dangerous including opiates for chronic neuropathic leg pain and low back pain, Valium for anxiety, eliquis for a fib.  She is not sure if she's taking any of these medications. In fact she states that she takes a couple. She states that she reads the directions on the pill bottle and take some herself. She says that her daughter knows that the patient understands how to take her medication and she lets her take her home medicines. This has been extremely concerned. Many of her medications to cause severe harm or even death if taken inappropriately. Furthermore I did not feel that this person safe to be driving however her family does not seem to be limiting her access to a vehicle. Past Medical History:  Diagnosis Date  . A-fib ([place])   . Anxiety   . Arrhythmia    afib  . Arthritis   . Atrial fibrillation ([place]) 12/23/2012   Overview:  Last Assessment & Plan:  Maintaining normal sinus rhythm. Suggested she continue on anticoagulation and metoprolol   . BP (high blood pressure) 10/25/2015  . Calculus of gallbladder 12/11/2013   Overview:  Last Assessment & Plan:  Acceptable risk for upcoming gallbladder surgery. Suggested she stop her anticoagulation 3 days prior to surgery. She is maintaining normal sinus rhythm. This could be restarted when she is felt to be in appropriate anticoagulation candidate, would recommend at least 2 or 3 days post procedure   . 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 CKD (chronic kidney disease) stage 3, GFR 30-59 ml/min   . Colon polyps    due again 04/2017  .  Depression   . Gallstones   . Hypertension   . Insomnia   . Neuropathy   . Peripheral neuropathy    no lumbar radiculopathy   Past Surgical History:  Procedure Laterality Date  . ABDOMINAL HYSTERECTOMY    . CHOLECYSTECTOMY N/A 01/12/2014   Procedure: LAPAROSCOPIC CHOLECYSTECTOMY ;  Surgeon: Coralie Keens, MD;  Location: [facility];  Service: General;  Laterality: N/A;   Current Outpatient Prescriptions on File Prior to Visit  Medication Sig Dispense Refill  . apixaban (ELIQUIS) 5 MG TABS tablet Take 1 tablet (5 mg total) by mouth 2 (two) times daily. 180 tablet 4  . diazepam (VALIUM) 5 MG tablet TAKE 1 TABLET BY MOUTH TWICE A DAY AS NEEDED FOR ANXIETY 60 tablet 2  . donepezil (ARICEPT) 5 MG tablet Take 1 tablet (5 mg total) by mouth at bedtime. 30 tablet 3  . fenofibrate (TRICOR) 145 MG tablet TAKE 1 TABLET (145 MG TOTAL) BY MOUTH DAILY. 90 tablet  1  . gabapentin (NEURONTIN) 600 MG tablet Take 1 tablet (600 mg total) by mouth 2 (two) times daily. 60 tablet 3  . hydrochlorothiazide (HYDRODIURIL) 25 MG tablet Take 1 tablet by mouth  daily 90 tablet 3  . HYDROcodone-acetaminophen (NORCO/VICODIN) 5-325 MG tablet Take 1 tablet by mouth 3 (three) times daily as needed for moderate pain. 90 tablet 0  . hydrOXYzine (ATARAX/VISTARIL) 25 MG tablet Take 1 tablet (25 mg total) by mouth at bedtime. 90 tablet 1  . memantine (NAMENDA) 10 MG tablet Take 1 tablet (10 mg total) by mouth 2 (two) times daily. 60 tablet 5  . metoprolol tartrate (LOPRESSOR) 25 MG tablet TAKE 1 TABLET BY MOUTH TWO  TIMES DAILY 180 tablet 1  . pyridOXINE (VITAMIN B-6) 100 MG tablet Take 100 mg by mouth daily.    . simvastatin (ZOCOR) 20 MG tablet Take 1 tablet (20 mg total) by mouth at bedtime. 90 tablet 3   No current facility-administered medications on file prior to visit.    Allergies  Allergen Reactions  . Paxil [Paroxetine Hcl]     Can't sleep   Social History   Social History  . Marital status: Widowed    Spouse  name: N/A  . Number of children: N/A  . Years of education: N/A   Occupational History  . Not on file.   Social History Main Topics  . Smoking status: Current Every Day Smoker    Packs/day: 1.00    Years: 55.00    Types: Cigarettes  . Smokeless tobacco: Current User  . Alcohol use No  . Drug use: No  . Sexual activity: Not on file   Other Topics Concern  . Not on file   Social History Narrative  . No narrative on file   No family history on file.    Review of Systems  All other systems reviewed and are negative.      Objective:   Physical Exam  Constitutional: She appears well-developed and well-nourished. No distress.  HENT:  Head: Normocephalic and atraumatic.  Right Ear: External ear normal.  Left Ear: External ear normal.  Nose: Nose normal.  Mouth/Throat: Oropharynx is clear and moist. No oropharyngeal exudate.  Eyes: Conjunctivae and EOM are normal. Pupils are equal, round, and reactive to light.  Neck: Neck supple. No JVD present. No thyromegaly present.  Cardiovascular: Normal rate, regular rhythm and normal heart sounds.  Exam reveals no gallop and no friction rub.   No murmur heard. Pulmonary/Chest: Effort normal and breath sounds normal. No respiratory distress. She has no wheezes. She has no rales. She exhibits no tenderness.  Abdominal: Soft. Bowel sounds are normal. She exhibits no distension and no mass. There is no tenderness. There is no rebound and no guarding.  Neurological: She is alert. She has normal reflexes. No cranial nerve deficit. She exhibits normal muscle tone. Coordination normal.  Skin: She is not diaphoretic.  Psychiatric: Cognition and memory are impaired. She exhibits abnormal recent memory. She exhibits normal remote memory.  Vitals reviewed.         Assessment & Plan:  Dyslipidemia - Plan: CBC with Differential/Platelet, COMPLETE METABOLIC PANEL WITH GFR, Lipid panel  Alzheimer's dementia without behavioral disturbance,  unspecified timing of dementia onset - Plan: Ambulatory referral to Home Health  Non-compliance - Plan: Ambulatory referral to [place] and foremost, the patient should not be driving. I will contact the DMV and raise my concerns. This is for the safety of the public.  Furthermore I am not going to refill her opiate or benzodiazepine until I have assurances that the medications are being supervised properly at home. Therefore I will schedule home health consultation for a nurse to go home, reviewed her medicine list with the patient, arrange a pillbox to ensure that the patient's taking her medications appropriately. I'm also concerned by the fact the patient is here having driven herself when at the last visit I was very clear with the daughter she shouldn't be doing this. In all honesty, I'm not sure if the daughter is aware that the patient has driven herself here. However I am concerned regarding the level of supervision. I believe the patient's dementia is much more serious than the patient or daughter understand.

## 2016-11-17 LAB — HEMOGLOBIN A1C
Hgb A1c MFr Bld: 5.7 % — ABNORMAL HIGH (ref <5.7)
MEAN PLASMA GLUCOSE: 117 mg/dL

## 2016-11-29 ENCOUNTER — Telehealth: Payer: Self-pay | Admitting: Family Medicine

## 2016-11-29 NOTE — Telephone Encounter (Signed)
Called [facility] to check on referral - had to leave message to return call. Per Dr. Dennard Schaumann pt may have a refill on pain med until we can get this set up for her so they can manage her medications. He will only allow one month refill at a time.

## 2016-11-30 MED ORDER — HYDROCODONE-ACETAMINOPHEN 5-325 MG PO TABS: 1.0000 | ORAL_TABLET | Freq: Three times a day (TID) | ORAL | 0 refills | Status: DC | PRN

## 2016-11-30 NOTE — Telephone Encounter (Signed)
RX printed, left up front and patient's daughter aware to pick up via vm

## 2016-12-03 NOTE — Telephone Encounter (Signed)
Daughter called and states that she has gotten a pill box for the pt and will monitoring until [facility] comes in to do for her.

## 2016-12-06 ENCOUNTER — Other Ambulatory Visit: Payer: Self-pay | Admitting: Family Medicine

## 2016-12-14 DIAGNOSIS — H3321 Serous retinal detachment, right eye: Secondary | ICD-10-CM | POA: Diagnosis not present

## 2016-12-14 LAB — HM DIABETES EYE EXAM

## 2016-12-18 DIAGNOSIS — H353122 Nonexudative age-related macular degeneration, left eye, intermediate dry stage: Secondary | ICD-10-CM | POA: Diagnosis not present

## 2016-12-18 DIAGNOSIS — H353211 Exudative age-related macular degeneration, right eye, with active choroidal neovascularization: Secondary | ICD-10-CM | POA: Diagnosis not present

## 2017-01-10 ENCOUNTER — Telehealth: Payer: Self-pay | Admitting: Family Medicine

## 2017-01-10 MED ORDER — HYDROCODONE-ACETAMINOPHEN 5-325 MG PO TABS: 1.0000 | ORAL_TABLET | Freq: Three times a day (TID) | ORAL | 0 refills | Status: DC | PRN

## 2017-01-10 NOTE — Telephone Encounter (Signed)
RX printed x 3 months, left up front and patient's dtr aware to pick up in the am via vm

## 2017-01-10 NOTE — Telephone Encounter (Signed)
pts daughter calling requesting refill on hydrocodone.

## 2017-01-10 NOTE — Telephone Encounter (Signed)
Ok to refill??      And ok to do for 3 months?

## 2017-01-10 NOTE — Telephone Encounter (Signed)
ok 

## 2017-01-22 DIAGNOSIS — H353211 Exudative age-related macular degeneration, right eye, with active choroidal neovascularization: Secondary | ICD-10-CM | POA: Diagnosis not present

## 2017-02-23 ENCOUNTER — Other Ambulatory Visit: Payer: Self-pay | Admitting: Family Medicine

## 2017-02-25 ENCOUNTER — Encounter: Payer: Self-pay | Admitting: Family Medicine

## 2017-02-25 NOTE — Telephone Encounter (Signed)
Ok to refill 

## 2017-02-26 NOTE — Telephone Encounter (Signed)
ok 

## 2017-02-26 NOTE — Telephone Encounter (Signed)
RX called in .

## 2017-03-04 DIAGNOSIS — H353211 Exudative age-related macular degeneration, right eye, with active choroidal neovascularization: Secondary | ICD-10-CM | POA: Diagnosis not present

## 2017-03-07 ENCOUNTER — Other Ambulatory Visit: Payer: Self-pay | Admitting: Family Medicine

## 2017-03-13 ENCOUNTER — Telehealth: Payer: Self-pay | Admitting: Family Medicine

## 2017-03-13 NOTE — Telephone Encounter (Signed)
Pt needs refill on hydrocodone.  °

## 2017-03-13 NOTE — Telephone Encounter (Signed)
Ok to refill 

## 2017-03-14 MED ORDER — HYDROCODONE-ACETAMINOPHEN 5-325 MG PO TABS: 1.0000 | ORAL_TABLET | Freq: Three times a day (TID) | ORAL | 0 refills | Status: DC | PRN

## 2017-03-14 NOTE — Telephone Encounter (Signed)
ok 

## 2017-03-14 NOTE — Telephone Encounter (Signed)
Rx x 3 months are ready and up front to be picked up - tried to call no answer and no vm

## 2017-04-08 DIAGNOSIS — H353211 Exudative age-related macular degeneration, right eye, with active choroidal neovascularization: Secondary | ICD-10-CM | POA: Diagnosis not present

## 2017-04-09 ENCOUNTER — Telehealth: Payer: Self-pay | Admitting: Family Medicine

## 2017-04-09 NOTE — Telephone Encounter (Signed)
Handicap placard form placed into yellow folder call daughter when ready for pickup [phone]

## 2017-04-15 NOTE — Telephone Encounter (Signed)
Form completed and per her daughter [name] she would like it mailed to the pt - form put in mail.

## 2017-04-25 ENCOUNTER — Other Ambulatory Visit: Payer: Self-pay | Admitting: Family Medicine

## 2017-04-25 NOTE — Telephone Encounter (Signed)
ok 

## 2017-04-25 NOTE — Telephone Encounter (Signed)
Ok to refill 

## 2017-05-21 DIAGNOSIS — H353221 Exudative age-related macular degeneration, left eye, with active choroidal neovascularization: Secondary | ICD-10-CM | POA: Diagnosis not present

## 2017-05-21 DIAGNOSIS — H353122 Nonexudative age-related macular degeneration, left eye, intermediate dry stage: Secondary | ICD-10-CM | POA: Diagnosis not present

## 2017-05-21 DIAGNOSIS — H353211 Exudative age-related macular degeneration, right eye, with active choroidal neovascularization: Secondary | ICD-10-CM | POA: Diagnosis not present

## 2017-05-21 LAB — HM DIABETES EYE EXAM

## 2017-06-04 ENCOUNTER — Telehealth: Payer: Self-pay | Admitting: Family Medicine

## 2017-06-04 MED ORDER — DIAZEPAM 2 MG PO TABS: 2.0000 mg | ORAL_TABLET | Freq: Two times a day (BID) | ORAL | 0 refills | Status: DC

## 2017-06-04 NOTE — Telephone Encounter (Signed)
Spoke to [place] (pt's dtr) and made aware of Dr. Samella Parr recommendations - med called to [facility] and apt made

## 2017-06-04 NOTE — Telephone Encounter (Signed)
Pharmacy sent note to make sure it was ok for this pt to take Diazepam along with the hydrocodone. Per Dr. Dennard Schaumann need to wean off diazepam d/t hydrocodone & memory loss. Decrease diazepam to 2 mg po bid then needs OV in 1 month.

## 2017-06-18 ENCOUNTER — Other Ambulatory Visit: Payer: Self-pay | Admitting: Family Medicine

## 2017-07-02 ENCOUNTER — Encounter: Payer: Self-pay | Admitting: Family Medicine

## 2017-07-02 ENCOUNTER — Ambulatory Visit (INDEPENDENT_AMBULATORY_CARE_PROVIDER_SITE_OTHER): Payer: Medicare Other | Admitting: Family Medicine

## 2017-07-02 VITALS — BP 118/70 | HR 98 | Temp 98.1°F | Resp 14 | Ht 64.5 in | Wt 195.0 lb

## 2017-07-02 DIAGNOSIS — G309 Alzheimer's disease, unspecified: Secondary | ICD-10-CM | POA: Diagnosis not present

## 2017-07-02 DIAGNOSIS — I10 Essential (primary) hypertension: Secondary | ICD-10-CM

## 2017-07-02 DIAGNOSIS — G629 Polyneuropathy, unspecified: Secondary | ICD-10-CM

## 2017-07-02 DIAGNOSIS — F0280 Dementia in other diseases classified elsewhere without behavioral disturbance: Secondary | ICD-10-CM

## 2017-07-02 DIAGNOSIS — E119 Type 2 diabetes mellitus without complications: Secondary | ICD-10-CM | POA: Diagnosis not present

## 2017-07-02 DIAGNOSIS — I482 Chronic atrial fibrillation, unspecified: Secondary | ICD-10-CM

## 2017-07-02 DIAGNOSIS — R7309 Other abnormal glucose: Secondary | ICD-10-CM | POA: Diagnosis not present

## 2017-07-02 MED ORDER — GABAPENTIN 600 MG PO TABS: 600.0000 mg | ORAL_TABLET | Freq: Three times a day (TID) | ORAL | 3 refills | Status: DC

## 2017-07-02 NOTE — Progress Notes (Signed)
Subjective:    Patient ID: Catherine Orozco, female    DOB: Oct 03, 1940, 77 y.o.   MRN: [id number]  Medication Refill   09/06/16 After the patient's last visit, urine culture confirmed urinary tract infection and she was treated with antibiotics. She is here today again complaining of dysuria, urgency, and frequency. I became concerned as the frequency of her bladder infections. Therefore I asked for further clarification questions to determine the frequency and onset of symptoms. Patient was unable to provide me with this information. She seemed very confused. Therefore I performed a Mini-Mental status exam. Patient was unable to tell me the date. She incorrectly told me the year, the month, the day of the week, and even the season. She is able to tell me the location of his office. She is able to tell me 2 out of 3 objects on recall. She is unable to perform world or serial sevens. Therefore she scored 19 out of 30 on Mini-Mental status exam. I asked the patient to perform the clock drawing exercise. She is unable to draw any hands on the clock or correctly demonstrate the time. She draws a circle and then writes 12, 12:30, 1, 1:30 in sequential order from the 12 position to the 6 position.  Furthermore the numbers are not even incorrectly written on the clock but written to the side.  At that time, mmy plan was: At that time, my plan was: Patient should not be driving. I asked the patient to start Aricept 5 mg by mouth daily and recheck in one month with me with her daughter present. Patient lives at home with her daughter who is her primary caretaker. I believe I need to get more background information about what could possibly be happening. I would also like to obtain imaging of the brain to evaluate for possible stroke given her history of atrial fibrillation. Meanwhile treat her urinary tract infection with Cipro 500 mg by mouth twice a day for 7 days and send a urine culture    10/03/16 Patient  took one dose of Aricept and experienced nightmares that evening and refused to take any more. She is here today with her daughter. She continues to minimize her memory loss. However even her daughter endorses gradual slowly worsening memory loss. I repeated Mini-Mental Status exam today. Today the patient has a difficult time telling me the year. She can remember 2 out of 3 objects on recall. She is able to spell world today in reverse. Still unable to perform serial sevens. Clock drawing remains extremely abnormal and concerning. Patient is unable to appropriately draw a circle with the correct digits in the correct location. Nor is she able to draw the correct time. Today she scores 24 out of 30 on Mini-Mental status exam because she can perform spelling world in reverse which is different from last time. However her performance is still markedly abnormal.  AT that time, my plan was: Daughter agrees that the patient's memory is abnormal and her performance on Mini-Mental status exam today "eye opening."  Patient should no longer be driving. I reiterated that her daughter unable to tolerate Aricept and refuses to try again. Begin Namenda 10 mg by mouth twice a day. I then went over the medication list with the patient and her daughter. She is on numerous medications that can impair judgment and memory including hydroxyzine for insomnia, gabapentin for neuropathic pain in her legs, hydrocodone for back pain and neuropathic pain in her legs, and Valium  for anxiety. I strongly encouraged the patient and her daughter to gradually try to wean the patient away from these medications. Immediately discontinue hydroxyzine. Slowly try to wean the patient off of Valium at home. For the time being I will not change her gabapentin or hydrocodone as she does suffer from pain. However I want to limit the amount of centrally acting medication the patient is taking. Recheck the patient in 3 months.  11/16/16 Patient is here  today having driven herself by herself. This is against my medical recommendation last time. Her daughter is apparently at work. Obviously the patient is still driving. She is here today for a medicine check. However the patient is unable to tell me any of the names the medication she's taking. She is unable to tell me how many times a day she takes many of her medications. Several of her medications are extremely dangerous including opiates for chronic neuropathic leg pain and low back pain, Valium for anxiety, eliquis for a fib.  She is not sure if she's taking any of these medications. In fact she states that she takes a couple. She states that she reads the directions on the pill bottle and take some herself. She says that her daughter knows that the patient understands how to take her medication and she lets her take her home medicines. This has been extremely concerned. Many of her medications to cause severe harm or even death if taken inappropriately. Furthermore I did not feel that this person safe to be driving however her family does not seem to be limiting her access to a vehicle.  At that time, my plan was: First and foremost, the patient should not be driving. I will contact the DMV and raise my concerns. This is for the safety of the public. Furthermore I am not going to refill her opiate or benzodiazepine until I have assurances that the medications are being supervised properly at home. Therefore I will schedule home health consultation for a nurse to go home, reviewed her medicine list with the patient, arrange a pillbox to ensure that the patient's taking her medications appropriately. I'm also concerned by the fact the patient is here having driven herself when at the last visit I was very clear with the daughter she shouldn't be doing this. In all honesty, I'm not sure if the daughter is aware that the patient has driven herself here. However I am concerned regarding the level of supervision. I  believe the patient's dementia is much more serious than the patient or daughter understand.  07/02/17 Patient is here today at my request for follow-up.  She has successfully weaned down on her Valium to 2 mg once a day in the morning.  She still takes hydrocodone 2-3 times a day.  Without it she has severe neuropathic pain in both feet and both legs that is debilitating.  She is unable to tolerate the pain without the pain medication.  She is also taking gabapentin 600 mg twice daily.  Her daughter is interested in possibly increasing the dose further as this seems to help her pain more than anything.  We discussed the risk of dizziness and sedation and falls by increasing the medication however the pain is severe in both the patient and the daughter are willing to try increasing the dose further for additional benefit.  Daughter has not yet made herself power of attorney but is functioning as the power of attorney and paying all the bills.  She is  now on her mother's checkbook.  Mother is no longer driving.  Daughter is arranging her pills in a pillbox.  Her blood pressure is well controlled at 118/70.  She denies any chest pain shortness of breath or dyspnea on exertion.  Her heart rate is mildly elevated at 98 bpm.  It is irregularly irregular.  She is compliant with her anticoagulant for stroke prevention due to her atrial fibrillation. she denies any syncope or near syncope.  Her biggest concern is the unrelenting burning pain in both legs distal to the knee.  Diabetic foot exam is performed today.  She does have diminished sensation in the plantar aspects of both feet however she has normal pulses 1/4 dorsalis pedis and posterior tibialis bilaterally.  There is no skin breakdown, rash, or ulceration Past Medical History:  Diagnosis Date  . A-fib ([place])   . Anxiety   . Arrhythmia    afib  . Arthritis   . Atrial fibrillation ([place]) 12/23/2012   Overview:  Last Assessment & Plan:  Maintaining normal  sinus rhythm. Suggested she continue on anticoagulation and metoprolol   . BP (high blood pressure) 10/25/2015  . Calculus of gallbladder 12/11/2013   Overview:  Last Assessment & Plan:  Acceptable risk for upcoming gallbladder surgery. Suggested she stop her anticoagulation 3 days prior to surgery. She is maintaining normal sinus rhythm. This could be restarted when she is felt to be in appropriate anticoagulation candidate, would recommend at least 2 or 3 days post procedure   . 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 CKD (chronic kidney disease) stage 3, GFR 30-59 ml/min ([facility])   . Colon polyps    due again 04/2017  . Depression   . Gallstones   . Hypertension   . Insomnia   . Neuropathy   . Peripheral neuropathy    no lumbar radiculopathy   Past Surgical History:  Procedure Laterality Date  . ABDOMINAL HYSTERECTOMY    . CHOLECYSTECTOMY N/A 01/12/2014   Procedure: LAPAROSCOPIC CHOLECYSTECTOMY ;  Surgeon: Coralie Keens, MD;  Location: [facility];  Service: General;  Laterality: N/A;   Current Outpatient Medications on File Prior to Visit  Medication Sig Dispense Refill  . apixaban (ELIQUIS) 5 MG TABS tablet Take 1 tablet (5 mg total) by mouth 2 (two) times daily. 180 tablet 4  . diazepam (VALIUM) 2 MG tablet Take 1 tablet (2 mg total) by mouth 2 (two) times daily. (Patient taking differently: Take 2 mg by mouth every morning. ) 60 tablet 0  . donepezil (ARICEPT) 5 MG tablet Take 1 tablet (5 mg total) by mouth at bedtime. 30 tablet 3  . fenofibrate (TRICOR) 145 MG tablet TAKE 1 TABLET (145 MG TOTAL) BY MOUTH DAILY. 90 tablet 1  . hydrochlorothiazide (HYDRODIURIL) 25 MG tablet TAKE 1 TABLET BY MOUTH  DAILY 90 tablet 3  . HYDROcodone-acetaminophen (NORCO/VICODIN) 5-325 MG tablet Take 1 tablet by mouth 3 (three) times daily as needed for moderate pain. 90 tablet 0  . hydrOXYzine (ATARAX/VISTARIL) 25 MG tablet TAKE 1 TABLET (25 MG TOTAL) BY MOUTH AT BEDTIME. 90 tablet 1  . memantine (NAMENDA) 10  MG tablet Take 1 tablet (10 mg total) by mouth 2 (two) times daily. 60 tablet 5  . metoprolol tartrate (LOPRESSOR) 25 MG tablet TAKE 1 TABLET BY MOUTH TWO  TIMES DAILY 180 tablet 3  . pyridOXINE (VITAMIN B-6) 100 MG tablet Take 100 mg by mouth daily.    . simvastatin (ZOCOR) 20 MG tablet  Take 1 tablet (20 mg total) by mouth at bedtime. 90 tablet 3   No current facility-administered medications on file prior to visit.    Allergies  Allergen Reactions  . Paxil [Paroxetine Hcl]     Can't sleep   Social History   Socioeconomic History  . Marital status: Widowed    Spouse name: Not on file  . Number of children: Not on file  . Years of education: Not on file  . Highest education level: Not on file  Social Needs  . Financial resource strain: Not on file  . Food insecurity - worry: Not on file  . Food insecurity - inability: Not on file  . Transportation needs - medical: Not on file  . Transportation needs - non-medical: Not on file  Occupational History  . Not on file  Tobacco Use  . Smoking status: Current Every Day Smoker    Packs/day: 1.00    Years: 55.00    Pack years: 55.00    Types: Cigarettes  . Smokeless tobacco: Current User  Substance and Sexual Activity  . Alcohol use: No  . Drug use: No  . Sexual activity: Not on file  Other Topics Concern  . Not on file  Social History Narrative  . Not on file   No family history on file.    Review of Systems  All other systems reviewed and are negative.      Objective:   Physical Exam  Constitutional: She appears well-developed and well-nourished. No distress.  HENT:  Head: Normocephalic and atraumatic.  Right Ear: External ear normal.  Left Ear: External ear normal.  Nose: Nose normal.  Mouth/Throat: Oropharynx is clear and moist. No oropharyngeal exudate.  Eyes: Conjunctivae and EOM are normal. Pupils are equal, round, and reactive to light.  Neck: Neck supple. No JVD present. No thyromegaly present.    Cardiovascular: Normal rate, regular rhythm and normal heart sounds. Exam reveals no gallop and no friction rub.  No murmur heard. Pulmonary/Chest: Effort normal and breath sounds normal. No respiratory distress. She has no wheezes. She has no rales. She exhibits no tenderness.  Abdominal: Soft. Bowel sounds are normal. She exhibits no distension and no mass. There is no tenderness. There is no rebound and no guarding.  Neurological: She is alert. She has normal reflexes. No cranial nerve deficit. She exhibits normal muscle tone. Coordination normal.  Skin: She is not diaphoretic.  Psychiatric: Cognition and memory are impaired. She exhibits abnormal recent memory. She exhibits normal remote memory.  Vitals reviewed.         Assessment & Plan:  Chronic atrial fibrillation ([place]) - Plan: CBC with Differential/Platelet, COMPLETE METABOLIC PANEL WITH GFR  Alzheimer's dementia without behavioral disturbance, unspecified timing of dementia onset  Benign essential HTN  Neuropathy  Controlled type 2 diabetes mellitus without complication, without long-term current use of insulin ([place])  Blood pressure today is well controlled.  Patient is not fasting and therefore I cannot check her cholesterol however I will check a CBC to monitor for anemia on her anticoagulant.  I will also check a CMP to monitor her renal function and kidney function test.  I will add a hemoglobin A1c to this as well.  Her dementia appears stable according to the daughter.  She is not falling.  We discussed the risk and benefits of increasing gabapentin to 600 mg 3 times daily.  I discussed the risk of possible confusion, dizziness, increased risk of falls.  Both the patient and  the daughter are willing to accept this and would like to try increasing to 600 mg 3 times daily.  This is the highest I would increase the dose.  She continues to use Vicodin 3 times a day for pain.  I continue to encourage her to discontinue Valium  since she is only taking it once a day now.  Also recommended a referral to a pain clinic to discuss nerve ablations in the lumbar spine to potentially help her neuropathy.  Patient refuses this daughter states that she will discuss this further with her mother at home.

## 2017-07-06 LAB — CBC WITH DIFFERENTIAL/PLATELET
BASOS PCT: 0.4 %
Basophils Absolute: 45 cells/uL (ref 0–200)
EOS PCT: 1.3 %
Eosinophils Absolute: 146 cells/uL (ref 15–500)
HCT: 46.3 % — ABNORMAL HIGH (ref 35.0–45.0)
Hemoglobin: 16.5 g/dL — ABNORMAL HIGH (ref 11.7–15.5)
Lymphs Abs: 4021 cells/uL — ABNORMAL HIGH (ref 850–3900)
MCH: 31.7 pg (ref 27.0–33.0)
MCHC: 35.6 g/dL (ref 32.0–36.0)
MCV: 88.9 fL (ref 80.0–100.0)
MONOS PCT: 6.7 %
MPV: 11.7 fL (ref 7.5–12.5)
Neutro Abs: 6238 cells/uL (ref 1500–7800)
Neutrophils Relative %: 55.7 %
PLATELETS: 230 10*3/uL (ref 140–400)
RBC: 5.21 10*6/uL — AB (ref 3.80–5.10)
RDW: 12.1 % (ref 11.0–15.0)
TOTAL LYMPHOCYTE: 35.9 %
WBC: 11.2 10*3/uL — AB (ref 3.8–10.8)
WBCMIX: 750 {cells}/uL (ref 200–950)

## 2017-07-06 LAB — COMPLETE METABOLIC PANEL WITH GFR
AG Ratio: 1.7 (calc) (ref 1.0–2.5)
ALT: 17 U/L (ref 6–29)
AST: 25 U/L (ref 10–35)
Albumin: 4.1 g/dL (ref 3.6–5.1)
Alkaline phosphatase (APISO): 98 U/L (ref 33–130)
BUN/Creatinine Ratio: 14 (calc) (ref 6–22)
BUN: 13 mg/dL (ref 7–25)
CALCIUM: 9.7 mg/dL (ref 8.6–10.4)
CHLORIDE: 98 mmol/L (ref 98–110)
CO2: 32 mmol/L (ref 20–32)
Creat: 0.95 mg/dL — ABNORMAL HIGH (ref 0.60–0.93)
GFR, EST NON AFRICAN AMERICAN: 59 mL/min/{1.73_m2} — AB (ref >=60)
GFR, Est African American: 68 mL/min/{1.73_m2} (ref >=60)
GLUCOSE: 109 mg/dL — AB (ref 65–99)
Globulin: 2.4 g/dL (calc) (ref 1.9–3.7)
Potassium: 3.6 mmol/L (ref 3.5–5.3)
Sodium: 138 mmol/L (ref 135–146)
Total Bilirubin: 0.4 mg/dL (ref 0.2–1.2)
Total Protein: 6.5 g/dL (ref 6.1–8.1)

## 2017-07-06 LAB — TEST AUTHORIZATION

## 2017-07-06 LAB — HEMOGLOBIN A1C W/OUT EAG: Hgb A1c MFr Bld: 5.6 % of total Hgb (ref <5.7)

## 2017-07-10 ENCOUNTER — Other Ambulatory Visit: Payer: Self-pay | Admitting: Family Medicine

## 2017-07-10 NOTE — Telephone Encounter (Signed)
Patient is requesting a refill on Hydrocodone   LOV: 07/02/17 LRF:   03/14/17

## 2017-07-11 MED ORDER — HYDROCODONE-ACETAMINOPHEN 5-325 MG PO TABS: 1.0000 | ORAL_TABLET | Freq: Three times a day (TID) | ORAL | 0 refills | Status: DC | PRN

## 2017-08-09 ENCOUNTER — Other Ambulatory Visit: Payer: Self-pay | Admitting: Family Medicine

## 2017-08-09 NOTE — Telephone Encounter (Signed)
Patient is requesting a refill on Hydrocodone   LOV: 07/02/17  LRF:  07/11/17

## 2017-08-12 MED ORDER — HYDROCODONE-ACETAMINOPHEN 5-325 MG PO TABS: 1.0000 | ORAL_TABLET | Freq: Three times a day (TID) | ORAL | 0 refills | Status: DC | PRN

## 2017-09-12 ENCOUNTER — Other Ambulatory Visit: Payer: Self-pay | Admitting: Family Medicine

## 2017-09-12 MED ORDER — HYDROCODONE-ACETAMINOPHEN 5-325 MG PO TABS: 1.0000 | ORAL_TABLET | Freq: Three times a day (TID) | ORAL | 0 refills | Status: DC | PRN

## 2017-09-12 NOTE — Telephone Encounter (Signed)
Patient is requesting a refill on Hydrocodone   LOV: 07/02/17  LRF: 08/12/17

## 2017-09-12 NOTE — Telephone Encounter (Signed)
[facility]one [facility]

## 2017-09-13 ENCOUNTER — Other Ambulatory Visit: Payer: Self-pay | Admitting: Family Medicine

## 2017-10-11 ENCOUNTER — Other Ambulatory Visit: Payer: Self-pay | Admitting: Family Medicine

## 2017-10-11 MED ORDER — HYDROCODONE-ACETAMINOPHEN 5-325 MG PO TABS: 1.0000 | ORAL_TABLET | Freq: Three times a day (TID) | ORAL | 0 refills | Status: DC | PRN

## 2017-10-11 NOTE — Telephone Encounter (Signed)
Patient is requesting a refill on Hydrocodone   LOV: 07/02/17  LRF: 09/12/17

## 2017-10-11 NOTE — Telephone Encounter (Signed)
Pt needs refill on hydrocodone to [facility].

## 2017-10-16 ENCOUNTER — Other Ambulatory Visit: Payer: Self-pay | Admitting: Family Medicine

## 2017-10-17 NOTE — Telephone Encounter (Signed)
Ok to refill??  Last office visit 07/02/2017.  Last refill 06/04/2017.

## 2017-11-11 ENCOUNTER — Other Ambulatory Visit: Payer: Self-pay | Admitting: Family Medicine

## 2017-11-11 NOTE — Telephone Encounter (Signed)
Patient is requesting a refill on Hydrocodone   LOV: 07/02/17  LRF:   10/11/17

## 2017-11-11 NOTE — Telephone Encounter (Signed)
Patient needs hydrocodone sent to [place]  Also she has questions about the eliquis program she is on  [phone]

## 2017-11-12 MED ORDER — HYDROCODONE-ACETAMINOPHEN 5-325 MG PO TABS: 1.0000 | ORAL_TABLET | Freq: Three times a day (TID) | ORAL | 0 refills | Status: DC | PRN

## 2017-11-12 NOTE — Telephone Encounter (Signed)
Called and spoke to daughter about paperwork for pt's Eliquis.

## 2017-11-20 ENCOUNTER — Other Ambulatory Visit: Payer: Self-pay | Admitting: Family Medicine

## 2017-11-20 MED ORDER — APIXABAN 5 MG PO TABS: 5.0000 mg | ORAL_TABLET | Freq: Two times a day (BID) | ORAL | 3 refills | Status: DC

## 2017-12-03 DIAGNOSIS — H353122 Nonexudative age-related macular degeneration, left eye, intermediate dry stage: Secondary | ICD-10-CM | POA: Diagnosis not present

## 2017-12-03 DIAGNOSIS — H353211 Exudative age-related macular degeneration, right eye, with active choroidal neovascularization: Secondary | ICD-10-CM | POA: Diagnosis not present

## 2017-12-12 ENCOUNTER — Other Ambulatory Visit: Payer: Self-pay | Admitting: Family Medicine

## 2017-12-12 ENCOUNTER — Encounter: Payer: Self-pay | Admitting: *Deleted

## 2017-12-12 MED ORDER — HYDROCODONE-ACETAMINOPHEN 5-325 MG PO TABS: 1.0000 | ORAL_TABLET | Freq: Three times a day (TID) | ORAL | 0 refills | Status: DC | PRN

## 2017-12-12 NOTE — Telephone Encounter (Signed)
Patient is requesting a refill on Hydrocodone   LOV: 07/02/17  LRF:   11/12/17

## 2018-01-14 ENCOUNTER — Other Ambulatory Visit: Payer: Self-pay | Admitting: Family Medicine

## 2018-01-14 MED ORDER — HYDROCODONE-ACETAMINOPHEN 5-325 MG PO TABS: 1.0000 | ORAL_TABLET | Freq: Three times a day (TID) | ORAL | 0 refills | Status: DC | PRN

## 2018-01-14 NOTE — Telephone Encounter (Signed)
Patient is requesting a refill on Hydrocodone   LOV: 07/02/17  LRF: 12/12/17

## 2018-01-24 ENCOUNTER — Other Ambulatory Visit: Payer: Self-pay | Admitting: Family Medicine

## 2018-02-10 ENCOUNTER — Other Ambulatory Visit: Payer: Self-pay | Admitting: Family Medicine

## 2018-02-10 MED ORDER — HYDROCODONE-ACETAMINOPHEN 5-325 MG PO TABS: 1.0000 | ORAL_TABLET | Freq: Three times a day (TID) | ORAL | 0 refills | Status: DC | PRN

## 2018-02-10 NOTE — Telephone Encounter (Signed)
Patient is requesting a refill on Hydrocodone   LOV: 07/02/17 LRF:   01/14/18

## 2018-02-10 NOTE — Telephone Encounter (Signed)
[facility]one to [facility] [place] [address]

## 2018-03-17 ENCOUNTER — Other Ambulatory Visit: Payer: Self-pay | Admitting: Family Medicine

## 2018-03-17 MED ORDER — HYDROCODONE-ACETAMINOPHEN 5-325 MG PO TABS: 1.0000 | ORAL_TABLET | Freq: Three times a day (TID) | ORAL | 0 refills | Status: DC | PRN

## 2018-03-17 NOTE — Telephone Encounter (Signed)
Refill on hydrocodone to [facility] [address]

## 2018-03-17 NOTE — Telephone Encounter (Signed)
Patient is requesting a refill on Hydrocodone   LOV: 07/02/17  LRF:   02/10/2018

## 2018-04-14 ENCOUNTER — Other Ambulatory Visit: Payer: Self-pay | Admitting: Family Medicine

## 2018-04-14 NOTE — Telephone Encounter (Signed)
[facility]  Patient needs refill on her hydrocodone

## 2018-04-14 NOTE — Telephone Encounter (Signed)
Patient is requesting a refill on Hydrocodone   LOV: 07/02/17  LRF:   03/17/18

## 2018-04-15 MED ORDER — HYDROCODONE-ACETAMINOPHEN 5-325 MG PO TABS: 1.0000 | ORAL_TABLET | Freq: Three times a day (TID) | ORAL | 0 refills | Status: DC | PRN

## 2018-04-27 ENCOUNTER — Other Ambulatory Visit: Payer: Self-pay | Admitting: Family Medicine

## 2018-05-12 ENCOUNTER — Other Ambulatory Visit: Payer: Self-pay | Admitting: Family Medicine

## 2018-05-12 NOTE — Telephone Encounter (Signed)
Refill on hydrocodone to [facility]

## 2018-05-13 MED ORDER — HYDROCODONE-ACETAMINOPHEN 5-325 MG PO TABS: 1.0000 | ORAL_TABLET | Freq: Three times a day (TID) | ORAL | 0 refills | Status: DC | PRN

## 2018-05-13 NOTE — Telephone Encounter (Signed)
Patient is requesting a refill on Hydrocodone   LOV: 07/02/17  LRF:   04/15/18

## 2018-06-11 DIAGNOSIS — H353131 Nonexudative age-related macular degeneration, bilateral, early dry stage: Secondary | ICD-10-CM | POA: Diagnosis not present

## 2018-06-12 ENCOUNTER — Other Ambulatory Visit: Payer: Self-pay | Admitting: Family Medicine

## 2018-06-16 ENCOUNTER — Other Ambulatory Visit: Payer: Self-pay | Admitting: Family Medicine

## 2018-06-16 MED ORDER — HYDROCODONE-ACETAMINOPHEN 5-325 MG PO TABS: 1.0000 | ORAL_TABLET | Freq: Three times a day (TID) | ORAL | 0 refills | Status: DC | PRN

## 2018-06-16 NOTE — Telephone Encounter (Signed)
Patient is requesting a refill on Hydrocodone   LOV: 07/02/17  LRF:   05/13/18

## 2018-06-16 NOTE — Telephone Encounter (Signed)
Patient is calling to get refill on her hydrocodone  Please send to [facility]

## 2018-06-23 ENCOUNTER — Other Ambulatory Visit: Payer: Self-pay | Admitting: *Deleted

## 2018-06-23 MED ORDER — METOPROLOL TARTRATE 25 MG PO TABS: 25.0000 mg | ORAL_TABLET | Freq: Two times a day (BID) | ORAL | 3 refills | Status: DC

## 2018-06-26 ENCOUNTER — Other Ambulatory Visit: Payer: Self-pay | Admitting: Family Medicine

## 2018-06-26 MED ORDER — METOPROLOL TARTRATE 25 MG PO TABS: 25.0000 mg | ORAL_TABLET | Freq: Two times a day (BID) | ORAL | 3 refills | Status: DC

## 2018-07-14 ENCOUNTER — Other Ambulatory Visit: Payer: Self-pay | Admitting: Family Medicine

## 2018-07-14 MED ORDER — HYDROCODONE-ACETAMINOPHEN 5-325 MG PO TABS: 1.0000 | ORAL_TABLET | Freq: Three times a day (TID) | ORAL | 0 refills | Status: DC | PRN

## 2018-07-14 NOTE — Telephone Encounter (Signed)
Patient is requesting a refill on Hydrocodone   LOV: 07/02/17  LRF:  06/16/18

## 2018-07-14 NOTE — Telephone Encounter (Signed)
Refill on hydrocodone to [facility] [address]

## 2018-08-11 ENCOUNTER — Telehealth: Payer: Self-pay | Admitting: Family Medicine

## 2018-08-11 NOTE — Telephone Encounter (Signed)
Patient needs refill on her hydrocodone  To be called into [facility]  [phone]

## 2018-08-12 MED ORDER — HYDROCODONE-ACETAMINOPHEN 5-325 MG PO TABS: 1.0000 | ORAL_TABLET | Freq: Three times a day (TID) | ORAL | 0 refills | Status: DC | PRN

## 2018-08-12 NOTE — Telephone Encounter (Signed)
Patient is requesting a refill on Hydrocodone   LOV: 07/02/17  LRF:  07/14/18

## 2018-09-11 ENCOUNTER — Telehealth: Payer: Self-pay | Admitting: Family Medicine

## 2018-09-11 NOTE — Telephone Encounter (Signed)
Refill on hydrocodone to [facility]

## 2018-09-17 ENCOUNTER — Other Ambulatory Visit: Payer: Self-pay | Admitting: Family Medicine

## 2018-09-17 MED ORDER — APIXABAN 5 MG PO TABS: 5.0000 mg | ORAL_TABLET | Freq: Two times a day (BID) | ORAL | 1 refills | Status: DC

## 2018-09-17 NOTE — Telephone Encounter (Signed)
Patient is requesting a refill on Hydrocodone (pt's dtr aware of need for apt and will schedule for the end of June when hopefully this COVD has slowed)   LOV: 07/02/17  LRF:   08/12/2018

## 2018-09-18 MED ORDER — HYDROCODONE-ACETAMINOPHEN 5-325 MG PO TABS: 1.0000 | ORAL_TABLET | Freq: Three times a day (TID) | ORAL | 0 refills | Status: DC | PRN

## 2018-10-14 ENCOUNTER — Other Ambulatory Visit: Payer: Self-pay | Admitting: Family Medicine

## 2018-10-14 NOTE — Telephone Encounter (Signed)
Pt has not been seen in >1 year, asking for narcotic Nothing in chart noted that it is a hospice patient At minimal needs telephone visit with Dr. Dennard Schaumann next week before pain meds can be filled

## 2018-10-14 NOTE — Telephone Encounter (Signed)
Patient is requesting a refill on Hydrocodone   LOV: 07/02/17  LRF:   09/18/18

## 2018-10-15 NOTE — Telephone Encounter (Signed)
Called and left message on Paula's vm to call and schedule apt

## 2018-11-07 ENCOUNTER — Ambulatory Visit (INDEPENDENT_AMBULATORY_CARE_PROVIDER_SITE_OTHER): Payer: Medicare Other | Admitting: Family Medicine

## 2018-11-07 ENCOUNTER — Encounter: Payer: Self-pay | Admitting: Family Medicine

## 2018-11-07 ENCOUNTER — Other Ambulatory Visit: Payer: Self-pay

## 2018-11-07 VITALS — BP 110/64 | HR 100 | Temp 98.6°F | Resp 18 | Ht 64.5 in | Wt 182.0 lb

## 2018-11-07 DIAGNOSIS — E119 Type 2 diabetes mellitus without complications: Secondary | ICD-10-CM | POA: Diagnosis not present

## 2018-11-07 DIAGNOSIS — G309 Alzheimer's disease, unspecified: Secondary | ICD-10-CM | POA: Diagnosis not present

## 2018-11-07 DIAGNOSIS — I10 Essential (primary) hypertension: Secondary | ICD-10-CM | POA: Diagnosis not present

## 2018-11-07 DIAGNOSIS — F0280 Dementia in other diseases classified elsewhere without behavioral disturbance: Secondary | ICD-10-CM

## 2018-11-07 DIAGNOSIS — G629 Polyneuropathy, unspecified: Secondary | ICD-10-CM

## 2018-11-07 DIAGNOSIS — I482 Chronic atrial fibrillation, unspecified: Secondary | ICD-10-CM

## 2018-11-07 DIAGNOSIS — E782 Mixed hyperlipidemia: Secondary | ICD-10-CM

## 2018-11-07 MED ORDER — DULOXETINE HCL 30 MG PO CPEP: 30.0000 mg | ORAL_CAPSULE | Freq: Every day | ORAL | 1 refills | Status: DC

## 2018-11-07 MED ORDER — HYDROCODONE-ACETAMINOPHEN 5-325 MG PO TABS: 1.0000 | ORAL_TABLET | Freq: Three times a day (TID) | ORAL | 0 refills | Status: DC | PRN

## 2018-11-07 NOTE — Progress Notes (Signed)
Subjective:    Patient ID: Catherine Orozco, female    DOB: [birth year], 78 y.o.   MRN: [id number]  Medication Refill  09/06/16 After the patient's last visit, urine culture confirmed urinary tract infection and she was treated with antibiotics. She is here today again complaining of dysuria, urgency, and frequency. I became concerned as the frequency of her bladder infections. Therefore I asked for further clarification questions to determine the frequency and onset of symptoms. Patient was unable to provide me with this information. She seemed very confused. Therefore I performed a Mini-Mental status exam. Patient was unable to tell me the date. She incorrectly told me the year, the month, the day of the week, and even the season. She is able to tell me the location of his office. She is able to tell me 2 out of 3 objects on recall. She is unable to perform world or serial sevens. Therefore she scored 19 out of 30 on Mini-Mental status exam. I asked the patient to perform the clock drawing exercise. She is unable to draw any hands on the clock or correctly demonstrate the time. She draws a circle and then writes 12, 12:30, 1, 1:30 in sequential order from the 12 position to the 6 position.  Furthermore the numbers are not even incorrectly written on the clock but written to the side.  At that time, mmy plan was: At that time, my plan was: Patient should not be driving. I asked the patient to start Aricept 5 mg by mouth daily and recheck in one month with me with her daughter present. Patient lives at home with her daughter who is her primary caretaker. I believe I need to get more background information about what could possibly be happening. I would also like to obtain imaging of the brain to evaluate for possible stroke given her history of atrial fibrillation. Meanwhile treat her urinary tract infection with Cipro 500 mg by mouth twice a day for 7 days and send a urine culture    10/03/16 Patient  took one dose of Aricept and experienced nightmares that evening and refused to take any more. She is here today with her daughter. She continues to minimize her memory loss. However even her daughter endorses gradual slowly worsening memory loss. I repeated Mini-Mental Status exam today. Today the patient has a difficult time telling me the year. She can remember 2 out of 3 objects on recall. She is able to spell world today in reverse. Still unable to perform serial sevens. Clock drawing remains extremely abnormal and concerning. Patient is unable to appropriately draw a circle with the correct digits in the correct location. Nor is she able to draw the correct time. Today she scores 24 out of 30 on Mini-Mental status exam because she can perform spelling world in reverse which is different from last time. However her performance is still markedly abnormal.  AT that time, my plan was: Daughter agrees that the patient's memory is abnormal and her performance on Mini-Mental status exam today "eye opening."  Patient should no longer be driving. I reiterated that her daughter unable to tolerate Aricept and refuses to try again. Begin Namenda 10 mg by mouth twice a day. I then went over the medication list with the patient and her daughter. She is on numerous medications that can impair judgment and memory including hydroxyzine for insomnia, gabapentin for neuropathic pain in her legs, hydrocodone for back pain and neuropathic pain in her legs, and Valium for  anxiety. I strongly encouraged the patient and her daughter to gradually try to wean the patient away from these medications. Immediately discontinue hydroxyzine. Slowly try to wean the patient off of Valium at home. For the time being I will not change her gabapentin or hydrocodone as she does suffer from pain. However I want to limit the amount of centrally acting medication the patient is taking. Recheck the patient in 3 months.  11/16/16 Patient is here  today having driven herself by herself. This is against my medical recommendation last time. Her daughter is apparently at work. Obviously the patient is still driving. She is here today for a medicine check. However the patient is unable to tell me any of the names the medication she's taking. She is unable to tell me how many times a day she takes many of her medications. Several of her medications are extremely dangerous including opiates for chronic neuropathic leg pain and low back pain, Valium for anxiety, eliquis for a fib.  She is not sure if she's taking any of these medications. In fact she states that she takes a couple. She states that she reads the directions on the pill bottle and take some herself. She says that her daughter knows that the patient understands how to take her medication and she lets her take her home medicines. This has been extremely concerned. Many of her medications to cause severe harm or even death if taken inappropriately. Furthermore I did not feel that this person safe to be driving however her family does not seem to be limiting her access to a vehicle.  At that time, my plan was: First and foremost, the patient should not be driving. I will contact the DMV and raise my concerns. This is for the safety of the public. Furthermore I am not going to refill her opiate or benzodiazepine until I have assurances that the medications are being supervised properly at home. Therefore I will schedule home health consultation for a nurse to go home, reviewed her medicine list with the patient, arrange a pillbox to ensure that the patient's taking her medications appropriately. I'm also concerned by the fact the patient is here having driven herself when at the last visit I was very clear with the daughter she shouldn't be doing this. In all honesty, I'm not sure if the daughter is aware that the patient has driven herself here. However I am concerned regarding the level of supervision. I  believe the patient's dementia is much more serious than the patient or daughter understand.  07/02/17 Patient is here today at my request for follow-up.  She has successfully weaned down on her Valium to 2 mg once a day in the morning.  She still takes hydrocodone 2-3 times a day.  Without it she has severe neuropathic pain in both feet and both legs that is debilitating.  She is unable to tolerate the pain without the pain medication.  She is also taking gabapentin 600 mg twice daily.  Her daughter is interested in possibly increasing the dose further as this seems to help her pain more than anything.  We discussed the risk of dizziness and sedation and falls by increasing the medication however the pain is severe in both the patient and the daughter are willing to try increasing the dose further for additional benefit.  Daughter has not yet made herself power of attorney but is functioning as the power of attorney and paying all the bills.  She is now  on her mother's checkbook.  Mother is no longer driving.  Daughter is arranging her pills in a pillbox.  Her blood pressure is well controlled at 118/70.  She denies any chest pain shortness of breath or dyspnea on exertion.  Her heart rate is mildly elevated at 98 bpm.  It is irregularly irregular.  She is compliant with her anticoagulant for stroke prevention due to her atrial fibrillation. she denies any syncope or near syncope.  Her biggest concern is the unrelenting burning pain in both legs distal to the knee.  Diabetic foot exam is performed today.  She does have diminished sensation in the plantar aspects of both feet however she has normal pulses 1/4 dorsalis pedis and posterior tibialis bilaterally.  There is no skin breakdown, rash, or ulceration.  At that time, my plan was: Blood pressure today is well controlled.  Patient is not fasting and therefore I cannot check her cholesterol however I will check a CBC to monitor for anemia on her anticoagulant.   I will also check a CMP to monitor her renal function and kidney function test.  I will add a hemoglobin A1c to this as well.  Her dementia appears stable according to the daughter.  She is not falling.  We discussed the risk and benefits of increasing gabapentin to 600 mg 3 times daily.  I discussed the risk of possible confusion, dizziness, increased risk of falls.  Both the patient and the daughter are willing to accept this and would like to try increasing to 600 mg 3 times daily.  This is the highest I would increase the dose.  She continues to use Vicodin 3 times a day for pain.  I continue to encourage her to discontinue Valium since she is only taking it once a day now.  Also recommended a referral to a pain clinic to discuss nerve ablations in the lumbar spine to potentially help her neuropathy.  Patient refuses this daughter states that she will discuss this further with her mother at home.  11/07/18 Patient is here today with her daughter because we would not refill her medication until she came in for an office visit.  Unfortunately she is not fasting.  She is currently taking hydrocodone 3 times a day.  The dose is 5/325.  She is taking this for severe neuropathy.  She is also taking gabapentin 600 mg 3 times a day.  She continues to complain of breakthrough burning stinging pain in both legs from her knees to her feet.  The pain is constant.  There are no exacerbating or alleviating factors.  However she states that her pain is relatively well controlled on the hydrocodone.  She does not feel like she needs to increase the strength of the medication.  Her daughter would be interested in trying other medications that may help with the neuropathy such as Cymbalta.  Cannot tolerate Lyrica as it caused insomnia.  Patient's blood pressure today is well controlled.  Her heart rate is irregular due to her atrial fibrillation.  She denies any palpitations or shortness of breath or chest pain or dyspnea on  exertion.  Her daughter states that her memory loss is remained stable.  Her daughter has not seen any progression in her memory loss.  The patient is still administering her medication.  The daughter has a pill box.  The daughter states that she knows how to administer her medications and she never makes an air.  I cautioned them against this and  I have recommended supervision of the medication due to her mild dementia. Past Medical History:  Diagnosis Date  . A-fib ([place])   . Anxiety   . Arrhythmia    afib  . Arthritis   . Atrial fibrillation ([place]) 12/23/2012   Overview:  Last Assessment & Plan:  Maintaining normal sinus rhythm. Suggested she continue on anticoagulation and metoprolol   . BP (high blood pressure) 10/25/2015  . Calculus of gallbladder 12/11/2013   Overview:  Last Assessment & Plan:  Acceptable risk for upcoming gallbladder surgery. Suggested she stop her anticoagulation 3 days prior to surgery. She is maintaining normal sinus rhythm. This could be restarted when she is felt to be in appropriate anticoagulation candidate, would recommend at least 2 or 3 days post procedure   . 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 CKD (chronic kidney disease) stage 3, GFR 30-59 ml/min (HCC)   . Colon polyps    due again 04/2017  . Depression   . Gallstones   . Hypertension   . Insomnia   . Neuropathy   . Peripheral neuropathy    no lumbar radiculopathy   Past Surgical History:  Procedure Laterality Date  . ABDOMINAL HYSTERECTOMY    . CHOLECYSTECTOMY N/A 01/12/2014   Procedure: LAPAROSCOPIC CHOLECYSTECTOMY ;  Surgeon: Coralie Keens, MD;  Location: [facility];  Service: General;  Laterality: N/A;   Current Outpatient Medications on File Prior to Visit  Medication Sig Dispense Refill  . apixaban (ELIQUIS) 5 MG TABS tablet Take 1 tablet (5 mg total) by mouth 2 (two) times daily. 60 tablet 1  . fenofibrate (TRICOR) 145 MG tablet TAKE 1 TABLET (145 MG TOTAL) BY MOUTH DAILY. 90 tablet 1  .  gabapentin (NEURONTIN) 600 MG tablet TAKE 1 TABLET BY MOUTH 3  TIMES DAILY 270 tablet 3  . hydrochlorothiazide (HYDRODIURIL) 25 MG tablet TAKE 1 TABLET BY MOUTH  DAILY 90 tablet 3  . hydrOXYzine (ATARAX/VISTARIL) 25 MG tablet TAKE 1 TABLET (25 MG TOTAL) BY MOUTH AT BEDTIME. 90 tablet 2  . memantine (NAMENDA) 10 MG tablet Take 1 tablet (10 mg total) by mouth 2 (two) times daily. 60 tablet 5  . simvastatin (ZOCOR) 20 MG tablet Take 1 tablet (20 mg total) by mouth at bedtime. 90 tablet 3   No current facility-administered medications on file prior to visit.    Allergies  Allergen Reactions  . Paxil [Paroxetine Hcl]     Can't sleep   Social History   Socioeconomic History  . Marital status: Widowed    Spouse name: Not on file  . Number of children: Not on file  . Years of education: Not on file  . Highest education level: Not on file  Occupational History  . Not on file  Social Needs  . Financial resource strain: Not on file  . Food insecurity    Worry: Not on file    Inability: Not on file  . Transportation needs    Medical: Not on file    Non-medical: Not on file  Tobacco Use  . Smoking status: Current Every Day Smoker    Packs/day: 1.00    Years: 55.00    Pack years: 55.00    Types: Cigarettes  . Smokeless tobacco: Current User  Substance and Sexual Activity  . Alcohol use: No  . Drug use: No  . Sexual activity: Not on file  Lifestyle  . Physical activity    Days per week: Not on file  Minutes per session: Not on file  . Stress: Not on file  Relationships  . Social Herbalist on phone: Not on file    Gets together: Not on file    Attends religious service: Not on file    Active member of club or organization: Not on file    Attends meetings of clubs or organizations: Not on file    Relationship status: Not on file  . Intimate partner violence    Fear of current or ex partner: Not on file    Emotionally abused: Not on file    Physically abused: Not  on file    Forced sexual activity: Not on file  Other Topics Concern  . Not on file  Social History Narrative  . Not on file   No family history on file.    Review of Systems  All other systems reviewed and are negative.      Objective:   Physical Exam  Constitutional: She appears well-developed and well-nourished. No distress.  HENT:  Head: Normocephalic and atraumatic.  Right Ear: External ear normal.  Left Ear: External ear normal.  Nose: Nose normal.  Mouth/Throat: Oropharynx is clear and moist. No oropharyngeal exudate.  Eyes: Pupils are equal, round, and reactive to light. Conjunctivae and EOM are normal.  Neck: Neck supple. No JVD present. No thyromegaly present.  Cardiovascular: Normal rate, regular rhythm and normal heart sounds. Exam reveals no gallop and no friction rub.  No murmur heard. Pulmonary/Chest: Effort normal and breath sounds normal. No respiratory distress. She has no wheezes. She has no rales. She exhibits no tenderness.  Abdominal: Soft. Bowel sounds are normal. She exhibits no distension and no mass. There is no abdominal tenderness. There is no rebound and no guarding.  Neurological: She is alert. She has normal reflexes. No cranial nerve deficit. She exhibits normal muscle tone. Coordination normal.  Skin: She is not diaphoretic.  Psychiatric: Cognition and memory are impaired. She exhibits abnormal recent memory. She exhibits normal remote memory.  Vitals reviewed.         Assessment & Plan:  1. Mixed hyperlipidemia Patient is not fasting but I will not be able to get the patient back in clinic so I will check a lipid panel today given the fact she has not eaten in several hours.  Ideally would like her LDL cholesterol below 100. - CBC with Differential/Platelet - COMPLETE METABOLIC PANEL WITH GFR - Lipid panel  2. Chronic atrial fibrillation Her rate is properly controlled.  On exam today she is between 80 and 90 bpm.  She is properly  anticoagulated.  Check a CBC to monitor for any evidence of anemia  3. Alzheimer's dementia without behavioral disturbance, unspecified timing of dementia onset ([place]) Patient's dementia remained stable.  There is been no significant progression over the last year per the daughter's report.  Therefore I will make no changes in her medication at this time.  I have recommended that the patient not drive.  I also recommended that the daughter administer her medications and monitor for compliance to avoid accidental overdose or improper ingestion  4. Benign essential HTN Blood pressure is adequately controlled  5. Controlled type 2 diabetes mellitus without complication, without long-term current use of insulin (HCC) We will check a hemoglobin A1c 6. Neuropathy We discussed and decided to add Cymbalta 30 mg a day and then reassess via telephone in 1 month to see if this is helping her neuropathy.  Increase to  60 mg if helpful

## 2018-11-09 ENCOUNTER — Other Ambulatory Visit: Payer: Self-pay | Admitting: Family Medicine

## 2018-11-13 ENCOUNTER — Other Ambulatory Visit: Payer: Self-pay | Admitting: Family Medicine

## 2018-11-17 LAB — CBC WITH DIFFERENTIAL/PLATELET
Absolute Monocytes: 923 cells/uL (ref 200–950)
Basophils Absolute: 74 cells/uL (ref 0–200)
Basophils Relative: 0.6 %
Eosinophils Absolute: 148 cells/uL (ref 15–500)
Eosinophils Relative: 1.2 %
HCT: 45.6 % — ABNORMAL HIGH (ref 35.0–45.0)
Hemoglobin: 16 g/dL — ABNORMAL HIGH (ref 11.7–15.5)
Lymphs Abs: 3579 cells/uL (ref 850–3900)
MCH: 31.4 pg (ref 27.0–33.0)
MCHC: 35.1 g/dL (ref 32.0–36.0)
MCV: 89.6 fL (ref 80.0–100.0)
MPV: 11.2 fL (ref 7.5–12.5)
Monocytes Relative: 7.5 %
Neutro Abs: 7577 cells/uL (ref 1500–7800)
Neutrophils Relative %: 61.6 %
Platelets: 265 10*3/uL (ref 140–400)
RBC: 5.09 10*6/uL (ref 3.80–5.10)
RDW: 12.6 % (ref 11.0–15.0)
Total Lymphocyte: 29.1 %
WBC: 12.3 10*3/uL — ABNORMAL HIGH (ref 3.8–10.8)

## 2018-11-17 LAB — COMPLETE METABOLIC PANEL WITH GFR
AG Ratio: 1.7 (calc) (ref 1.0–2.5)
ALT: 13 U/L (ref 6–29)
AST: 21 U/L (ref 10–35)
Albumin: 4.3 g/dL (ref 3.6–5.1)
Alkaline phosphatase (APISO): 77 U/L (ref 37–153)
BUN: 12 mg/dL (ref 7–25)
CO2: 29 mmol/L (ref 20–32)
Calcium: 9.8 mg/dL (ref 8.6–10.4)
Chloride: 100 mmol/L (ref 98–110)
Creat: 0.89 mg/dL (ref 0.60–0.93)
GFR, Est African American: 73 mL/min/{1.73_m2} (ref >=60)
GFR, Est Non African American: 63 mL/min/{1.73_m2} (ref >=60)
Globulin: 2.5 g/dL (calc) (ref 1.9–3.7)
Glucose, Bld: 102 mg/dL — ABNORMAL HIGH (ref 65–99)
Potassium: 3.8 mmol/L (ref 3.5–5.3)
Sodium: 140 mmol/L (ref 135–146)
Total Bilirubin: 0.5 mg/dL (ref 0.2–1.2)
Total Protein: 6.8 g/dL (ref 6.1–8.1)

## 2018-11-17 LAB — TEST AUTHORIZATION

## 2018-11-17 LAB — LIPID PANEL
Cholesterol: 192 mg/dL (ref <200)
HDL: 34 mg/dL — ABNORMAL LOW (ref >=50)
Non-HDL Cholesterol (Calc): 158 mg/dL (calc) — ABNORMAL HIGH (ref <130)
Total CHOL/HDL Ratio: 5.6 (calc) — ABNORMAL HIGH (ref <5.0)
Triglycerides: 511 mg/dL — ABNORMAL HIGH (ref <150)

## 2018-11-17 LAB — HEMOGLOBIN A1C W/OUT EAG: Hgb A1c MFr Bld: 5.2 % of total Hgb (ref <5.7)

## 2018-12-01 ENCOUNTER — Other Ambulatory Visit: Payer: Self-pay | Admitting: Family Medicine

## 2018-12-01 MED ORDER — DULOXETINE HCL 30 MG PO CPEP: 30.0000 mg | ORAL_CAPSULE | Freq: Every day | ORAL | 2 refills | Status: DC

## 2018-12-08 DIAGNOSIS — H2513 Age-related nuclear cataract, bilateral: Secondary | ICD-10-CM | POA: Diagnosis not present

## 2018-12-08 DIAGNOSIS — H353122 Nonexudative age-related macular degeneration, left eye, intermediate dry stage: Secondary | ICD-10-CM | POA: Diagnosis not present

## 2018-12-10 ENCOUNTER — Other Ambulatory Visit: Payer: Self-pay | Admitting: Family Medicine

## 2018-12-12 ENCOUNTER — Other Ambulatory Visit: Payer: Self-pay | Admitting: Family Medicine

## 2018-12-12 MED ORDER — HYDROCODONE-ACETAMINOPHEN 5-325 MG PO TABS: 1.0000 | ORAL_TABLET | Freq: Three times a day (TID) | ORAL | 0 refills | Status: DC | PRN

## 2018-12-12 NOTE — Telephone Encounter (Signed)
Patient is requesting a refill on Hydrocodone   LOV: 11/07/18  LRF:   11/07/18

## 2018-12-12 NOTE — Telephone Encounter (Signed)
Refill on hydrocodone to [facility]

## 2019-01-13 ENCOUNTER — Other Ambulatory Visit: Payer: Self-pay

## 2019-01-13 NOTE — Telephone Encounter (Signed)
Requested Prescriptions   Pending Prescriptions Disp Refills  . HYDROcodone-acetaminophen (NORCO/VICODIN) 5-325 MG tablet 90 tablet 0    Sig: Take 1 tablet by mouth 3 (three) times daily as needed for moderate pain.    Last OV 11/07/2018  Last written 12/12/2018

## 2019-01-15 MED ORDER — HYDROCODONE-ACETAMINOPHEN 5-325 MG PO TABS: 1.0000 | ORAL_TABLET | Freq: Three times a day (TID) | ORAL | 0 refills | Status: DC | PRN

## 2019-01-31 ENCOUNTER — Other Ambulatory Visit: Payer: Self-pay | Admitting: Family Medicine

## 2019-02-10 ENCOUNTER — Other Ambulatory Visit: Payer: Self-pay | Admitting: Family Medicine

## 2019-02-11 ENCOUNTER — Other Ambulatory Visit: Payer: Self-pay | Admitting: Family Medicine

## 2019-02-11 NOTE — Telephone Encounter (Signed)
Requesting hydrocodone refill  [facility]

## 2019-02-11 NOTE — Telephone Encounter (Signed)
Patient is requesting a refill on Hydrocodone   LOV: 11/07/18   LRF:   02/11/2019

## 2019-02-12 MED ORDER — HYDROCODONE-ACETAMINOPHEN 5-325 MG PO TABS: 1.0000 | ORAL_TABLET | Freq: Three times a day (TID) | ORAL | 0 refills | Status: DC | PRN

## 2019-03-01 ENCOUNTER — Other Ambulatory Visit: Payer: Self-pay | Admitting: Family Medicine

## 2019-03-16 ENCOUNTER — Other Ambulatory Visit: Payer: Self-pay | Admitting: Family Medicine

## 2019-03-16 MED ORDER — HYDROCODONE-ACETAMINOPHEN 5-325 MG PO TABS: 1.0000 | ORAL_TABLET | Freq: Three times a day (TID) | ORAL | 0 refills | Status: DC | PRN

## 2019-03-16 NOTE — Telephone Encounter (Signed)
Patient calling to get refill on hydrocodone [facility]

## 2019-03-16 NOTE — Telephone Encounter (Signed)
Patient is requesting a refill on Hydrocodone   LOV: 11/07/18  LRF:   02/12/19

## 2019-04-13 ENCOUNTER — Other Ambulatory Visit: Payer: Self-pay | Admitting: Family Medicine

## 2019-04-13 NOTE — Telephone Encounter (Signed)
Calling to get refill on hydrocodone  [facility]

## 2019-04-14 MED ORDER — HYDROCODONE-ACETAMINOPHEN 5-325 MG PO TABS: 1.0000 | ORAL_TABLET | Freq: Three times a day (TID) | ORAL | 0 refills | Status: DC | PRN

## 2019-04-14 NOTE — Telephone Encounter (Signed)
Patient is requesting a refill on Hydrocodone   LOV: 11/07/18  LRF:   03/16/19

## 2019-04-28 ENCOUNTER — Other Ambulatory Visit: Payer: Self-pay | Admitting: Family Medicine

## 2019-05-11 ENCOUNTER — Other Ambulatory Visit: Payer: Self-pay | Admitting: Family Medicine

## 2019-05-14 ENCOUNTER — Other Ambulatory Visit: Payer: Self-pay

## 2019-05-14 ENCOUNTER — Telehealth: Payer: Self-pay | Admitting: Family Medicine

## 2019-05-14 MED ORDER — HYDROCODONE-ACETAMINOPHEN 5-325 MG PO TABS: 1.0000 | ORAL_TABLET | Freq: Three times a day (TID) | ORAL | 0 refills | Status: DC | PRN

## 2019-05-14 NOTE — Telephone Encounter (Signed)
Patient requesting refill on hydrocodone  [facility]

## 2019-05-14 NOTE — Telephone Encounter (Signed)
Requested Prescriptions   Pending Prescriptions Disp Refills  . HYDROcodone-acetaminophen (NORCO/VICODIN) 5-325 MG tablet 90 tablet 0    Sig: Take 1 tablet by mouth 3 (three) times daily as needed for moderate pain.     Last OV 11/07/2018   Last written 04/14/2019

## 2019-05-22 ENCOUNTER — Other Ambulatory Visit: Payer: Self-pay

## 2019-05-22 MED ORDER — APIXABAN 5 MG PO TABS: 5.0000 mg | ORAL_TABLET | Freq: Two times a day (BID) | ORAL | 5 refills | Status: DC

## 2019-06-05 ENCOUNTER — Other Ambulatory Visit: Payer: Self-pay | Admitting: Family Medicine

## 2019-06-08 DIAGNOSIS — H2513 Age-related nuclear cataract, bilateral: Secondary | ICD-10-CM | POA: Diagnosis not present

## 2019-06-15 ENCOUNTER — Other Ambulatory Visit: Payer: Self-pay | Admitting: Family Medicine

## 2019-06-15 MED ORDER — HYDROCODONE-ACETAMINOPHEN 5-325 MG PO TABS: 1.0000 | ORAL_TABLET | Freq: Three times a day (TID) | ORAL | 0 refills | Status: DC | PRN

## 2019-06-15 NOTE — Telephone Encounter (Signed)
[facility]  Patient needs refill on hydrocodone

## 2019-06-15 NOTE — Telephone Encounter (Signed)
Patient is requesting a refill on Hydrocodone   LOV: 11/07/18  LRF:   05/14/19

## 2019-07-13 ENCOUNTER — Other Ambulatory Visit: Payer: Self-pay | Admitting: Family Medicine

## 2019-07-13 NOTE — Telephone Encounter (Signed)
[facility]  PATIENT NEEDS REFILL ON HYDROCODONE

## 2019-07-13 NOTE — Telephone Encounter (Signed)
Patient is requesting a refill on Hydrocodone   LOV: 11/07/18  LRF:   06/15/2019

## 2019-07-14 MED ORDER — HYDROCODONE-ACETAMINOPHEN 5-325 MG PO TABS: 1.0000 | ORAL_TABLET | Freq: Three times a day (TID) | ORAL | 0 refills | Status: DC | PRN

## 2019-07-16 ENCOUNTER — Other Ambulatory Visit: Payer: Self-pay | Admitting: Family Medicine

## 2019-08-13 ENCOUNTER — Other Ambulatory Visit: Payer: Self-pay | Admitting: Family Medicine

## 2019-08-13 MED ORDER — HYDROCODONE-ACETAMINOPHEN 5-325 MG PO TABS: 1.0000 | ORAL_TABLET | Freq: Three times a day (TID) | ORAL | 0 refills | Status: DC | PRN

## 2019-08-13 NOTE — Telephone Encounter (Signed)
Patient is requesting a refill on Hydrocodone   LOV: 11/07/2018  LRF:   07/14/2019

## 2019-08-13 NOTE — Telephone Encounter (Signed)
Patient needs refill on her hydrocodone  [facility]

## 2019-09-16 ENCOUNTER — Other Ambulatory Visit: Payer: Self-pay | Admitting: Family Medicine

## 2019-09-16 NOTE — Telephone Encounter (Signed)
Patient needs refill on hydrocodone  [facility]

## 2019-09-16 NOTE — Telephone Encounter (Signed)
Patient is requesting a refill on Hydrocodone   LOV: 11/07/2018  LRF:   08/13/2019

## 2019-09-17 MED ORDER — HYDROCODONE-ACETAMINOPHEN 5-325 MG PO TABS: 1.0000 | ORAL_TABLET | Freq: Three times a day (TID) | ORAL | 0 refills | Status: DC | PRN

## 2019-09-21 ENCOUNTER — Telehealth: Payer: Self-pay | Admitting: Family Medicine

## 2019-09-21 NOTE — Chronic Care Management (AMB) (Signed)
  Chronic Care Management   Outreach Note  09/21/2019 Name: Catherine Orozco MRN: [id number] DOB: 1940/08/07  Referred by: [name], MD Reason for referral : Chronic Care Management   An unsuccessful telephone outreach was attempted today. The patient was referred to the pharmacist for assistance with care management and care coordination.   Follow Up Plan:   [place]

## 2019-10-16 ENCOUNTER — Other Ambulatory Visit: Payer: Self-pay | Admitting: Family Medicine

## 2019-10-16 MED ORDER — HYDROCODONE-ACETAMINOPHEN 5-325 MG PO TABS: 1.0000 | ORAL_TABLET | Freq: Three times a day (TID) | ORAL | 0 refills | Status: DC | PRN

## 2019-10-16 NOTE — Telephone Encounter (Signed)
Last refill: 09/17/2019 Last office visit: 11/07/2018

## 2019-11-17 ENCOUNTER — Other Ambulatory Visit: Payer: Self-pay

## 2019-11-17 MED ORDER — HYDROCODONE-ACETAMINOPHEN 5-325 MG PO TABS: 1.0000 | ORAL_TABLET | Freq: Three times a day (TID) | ORAL | 0 refills | Status: DC | PRN

## 2019-11-17 NOTE — Telephone Encounter (Signed)
Requested Prescriptions   Pending Prescriptions Disp Refills  . HYDROcodone-acetaminophen (NORCO/VICODIN) 5-325 MG tablet 90 tablet 0    Sig: Take 1 tablet by mouth 3 (three) times daily as needed for moderate pain.     Last OV 09/21/2019   Last written 10/16/2019

## 2019-12-07 ENCOUNTER — Other Ambulatory Visit: Payer: Self-pay | Admitting: Family Medicine

## 2019-12-14 ENCOUNTER — Other Ambulatory Visit: Payer: Self-pay | Admitting: Family Medicine

## 2019-12-14 MED ORDER — HYDROCODONE-ACETAMINOPHEN 5-325 MG PO TABS: 1.0000 | ORAL_TABLET | Freq: Three times a day (TID) | ORAL | 0 refills | Status: DC | PRN

## 2019-12-14 NOTE — Telephone Encounter (Signed)
Pt needs HYDROcodone-acetaminophen (NORCO/VICODIN) 5-325 MG refilled [facility] to [id number] - [place], [place] - [address]

## 2019-12-14 NOTE — Telephone Encounter (Signed)
Ok to refill??  Last office visit 11/07/2018.  Last refill 11/17/2019.  Of note, letter sent to patient to schedule F/U.

## 2020-01-03 ENCOUNTER — Other Ambulatory Visit: Payer: Self-pay | Admitting: Family Medicine

## 2020-01-11 DIAGNOSIS — H353131 Nonexudative age-related macular degeneration, bilateral, early dry stage: Secondary | ICD-10-CM | POA: Diagnosis not present

## 2020-01-11 DIAGNOSIS — H2513 Age-related nuclear cataract, bilateral: Secondary | ICD-10-CM | POA: Diagnosis not present

## 2020-01-12 ENCOUNTER — Ambulatory Visit (INDEPENDENT_AMBULATORY_CARE_PROVIDER_SITE_OTHER): Payer: Medicare Other | Admitting: Family Medicine

## 2020-01-12 ENCOUNTER — Other Ambulatory Visit: Payer: Self-pay

## 2020-01-12 VITALS — BP 102/60 | HR 102 | Temp 98.2°F | Wt 172.0 lb

## 2020-01-12 DIAGNOSIS — I10 Essential (primary) hypertension: Secondary | ICD-10-CM

## 2020-01-12 DIAGNOSIS — E119 Type 2 diabetes mellitus without complications: Secondary | ICD-10-CM | POA: Diagnosis not present

## 2020-01-12 DIAGNOSIS — E782 Mixed hyperlipidemia: Secondary | ICD-10-CM

## 2020-01-12 DIAGNOSIS — G309 Alzheimer's disease, unspecified: Secondary | ICD-10-CM | POA: Diagnosis not present

## 2020-01-12 DIAGNOSIS — I482 Chronic atrial fibrillation, unspecified: Secondary | ICD-10-CM

## 2020-01-12 DIAGNOSIS — F0280 Dementia in other diseases classified elsewhere without behavioral disturbance: Secondary | ICD-10-CM

## 2020-01-12 DIAGNOSIS — G629 Polyneuropathy, unspecified: Secondary | ICD-10-CM

## 2020-01-12 MED ORDER — HYDROCODONE-ACETAMINOPHEN 5-325 MG PO TABS: 1.0000 | ORAL_TABLET | Freq: Three times a day (TID) | ORAL | 0 refills | Status: DC | PRN

## 2020-01-12 NOTE — Progress Notes (Signed)
Subjective:    Patient ID: Catherine Orozco, female    DOB: August 04, 1940, 79 y.o.   MRN: [id number]   07/02/17 Patient is here today at my request for follow-up.  She has successfully weaned down on her Valium to 2 mg once a day in the morning.  She still takes hydrocodone 2-3 times a day.  Without it she has severe neuropathic pain in both feet and both legs that is debilitating.  She is unable to tolerate the pain without the pain medication.  She is also taking gabapentin 600 mg twice daily.  Her daughter is interested in possibly increasing the dose further as this seems to help her pain more than anything.  We discussed the risk of dizziness and sedation and falls by increasing the medication however the pain is severe in both the patient and the daughter are willing to try increasing the dose further for additional benefit.  Daughter has not yet made herself power of attorney but is functioning as the power of attorney and paying all the bills.  She is now on her mother's checkbook.  Mother is no longer driving.  Daughter is arranging her pills in a pillbox.  Her blood pressure is well controlled at 118/70.  She denies any chest pain shortness of breath or dyspnea on exertion.  Her heart rate is mildly elevated at 98 bpm.  It is irregularly irregular.  She is compliant with her anticoagulant for stroke prevention due to her atrial fibrillation. she denies any syncope or near syncope.  Her biggest concern is the unrelenting burning pain in both legs distal to the knee.  Diabetic foot exam is performed today.  She does have diminished sensation in the plantar aspects of both feet however she has normal pulses 1/4 dorsalis pedis and posterior tibialis bilaterally.  There is no skin breakdown, rash, or ulceration.  At that time, my plan was: Blood pressure today is well controlled.  Patient is not fasting and therefore I cannot check her cholesterol however I will check a CBC to monitor for anemia on her  anticoagulant.  I will also check a CMP to monitor her renal function and kidney function test.  I will add a hemoglobin A1c to this as well.  Her dementia appears stable according to the daughter.  She is not falling.  We discussed the risk and benefits of increasing gabapentin to 600 mg 3 times daily.  I discussed the risk of possible confusion, dizziness, increased risk of falls.  Both the patient and the daughter are willing to accept this and would like to try increasing to 600 mg 3 times daily.  This is the highest I would increase the dose.  She continues to use Vicodin 3 times a day for pain.  I continue to encourage her to discontinue Valium since she is only taking it once a day now.  Also recommended a referral to a pain clinic to discuss nerve ablations in the lumbar spine to potentially help her neuropathy.  Patient refuses this daughter states that she will discuss this further with her mother at home.  11/07/18 Patient is here today with her daughter because we would not refill her medication until she came in for an office visit.  Unfortunately she is not fasting.  She is currently taking hydrocodone 3 times a day.  The dose is 5/325.  She is taking this for severe neuropathy.  She is also taking gabapentin 600 mg 3 times a day.  She continues to complain of breakthrough burning stinging pain in both legs from her knees to her feet.  The pain is constant.  There are no exacerbating or alleviating factors.  However she states that her pain is relatively well controlled on the hydrocodone.  She does not feel like she needs to increase the strength of the medication.  Her daughter would be interested in trying other medications that may help with the neuropathy such as Cymbalta.  Cannot tolerate Lyrica as it caused insomnia.  Patient's blood pressure today is well controlled.  Her heart rate is irregular due to her atrial fibrillation.  She denies any palpitations or shortness of breath or chest pain  or dyspnea on exertion.  Her daughter states that her memory loss is remained stable.  Her daughter has not seen any progression in her memory loss.  The patient is still administering her medication.  The daughter has a pill box.  The daughter states that she knows how to administer her medications and she never makes an error.  I cautioned them against this and I have recommended supervision of the medication due to her mild dementia.  At that time, my plan was: 1. Mixed hyperlipidemia Patient is not fasting but I will not be able to get the patient back in clinic so I will check a lipid panel today given the fact she has not eaten in several hours.  Ideally would like her LDL cholesterol below 100. - CBC with Differential/Platelet - COMPLETE METABOLIC PANEL WITH GFR - Lipid panel  2. Chronic atrial fibrillation Her rate is properly controlled.  On exam today she is between 80 and 90 bpm.  She is properly anticoagulated.  Check a CBC to monitor for any evidence of anemia  3. Alzheimer's dementia without behavioral disturbance, unspecified timing of dementia onset ([place]) Patient's dementia remained stable.  There is been no significant progression over the last year per the daughter's report.  Therefore I will make no changes in her medication at this time.  I have recommended that the patient not drive.  I also recommended that the daughter administer her medications and monitor for compliance to avoid accidental overdose or improper ingestion  4. Benign essential HTN Blood pressure is adequately controlled  5. Controlled type 2 diabetes mellitus without complication, without long-term current use of insulin (HCC) We will check a hemoglobin A1c 6. Neuropathy We discussed and decided to add Cymbalta 30 mg a day and then reassess via telephone in 1 month to see if this is helping her neuropathy.  Increase to 60 mg if helpful   01/12/20 Patient is here today at my request for a checkup.  She  lives at home alone however her daughter lives in town and checks on her on a daily basis.  Her daughter also arranges a pillbox and instruct the patient on how to take her medication.  Both the patient and the daughter deny any worsening confusion.  They deny any medication errors.  Patient request that I refill her pain medication.  Today she is in A. fib.  Her heart rate is slightly elevated.  She denies any chest pain shortness of breath or dyspnea on exertion.  Her blood pressure slightly low.  They are not checking her blood pressure at home.  The patient denies any lightheadedness or dizziness.  They deny any polyuria, polydipsia, or blurry vision.  They deny any strokelike symptoms.  She denies any neurologic deficits.  She has not had any  symptoms to suggest a TIA.  She is compliant with her Eliquis.  She denies any bleeding or bruising.  Her only complaint is near constant burning stinging neuropathic pain in both legs.  She states that all she knows is that she is in constant pain.  However I am concerned that her memory may be worsening.  Thankfully she is not driving.  Daughter denies any falls.  Patient denies depression. Past Medical History:  Diagnosis Date  . A-fib ([place])   . Anxiety   . Arrhythmia    afib  . Arthritis   . Atrial fibrillation ([place]) 12/23/2012   Overview:  Last Assessment & Plan:  Maintaining normal sinus rhythm. Suggested she continue on anticoagulation and metoprolol   . BP (high blood pressure) 10/25/2015  . Calculus of gallbladder 12/11/2013   Overview:  Last Assessment & Plan:  Acceptable risk for upcoming gallbladder surgery. Suggested she stop her anticoagulation 3 days prior to surgery. She is maintaining normal sinus rhythm. This could be restarted when she is felt to be in appropriate anticoagulation candidate, would recommend at least 2 or 3 days post procedure   . 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 CKD (chronic kidney disease) stage 3, GFR 30-59 ml/min (HCC)    . Colon polyps    due again 04/2017  . Depression   . Gallstones   . Hypertension   . Insomnia   . Neuropathy   . Peripheral neuropathy    no lumbar radiculopathy   Past Surgical History:  Procedure Laterality Date  . ABDOMINAL HYSTERECTOMY    . CHOLECYSTECTOMY N/A 01/12/2014   Procedure: LAPAROSCOPIC CHOLECYSTECTOMY ;  Surgeon: Coralie Keens, MD;  Location: [facility];  Service: General;  Laterality: N/A;   Current Outpatient Medications on File Prior to Visit  Medication Sig Dispense Refill  . DULoxetine (CYMBALTA) 30 MG capsule TAKE 1 CAPSULE BY MOUTH EVERY DAY 90 capsule 3  . ELIQUIS 5 MG TABS tablet TAKE 1 TABLET BY MOUTH TWICE A DAY 60 tablet 5  . fenofibrate (TRICOR) 145 MG tablet TAKE 1 TABLET BY MOUTH DAILY 90 tablet 2  . gabapentin (NEURONTIN) 600 MG tablet TAKE 1 TABLET BY MOUTH 3  TIMES DAILY 270 tablet 3  . hydrochlorothiazide (HYDRODIURIL) 25 MG tablet TAKE 1 TABLET BY MOUTH  DAILY 90 tablet 3  . HYDROcodone-acetaminophen (NORCO/VICODIN) 5-325 MG tablet Take 1 tablet by mouth 3 (three) times daily as needed for moderate pain. Requires office visit before any further refills can be given. 90 tablet 0  . hydrOXYzine (ATARAX/VISTARIL) 25 MG tablet TAKE 1 TABLET BY MOUTH EVERYDAY AT BEDTIME 90 tablet 2  . memantine (NAMENDA) 10 MG tablet Take 1 tablet (10 mg total) by mouth 2 (two) times daily. 60 tablet 5  . metoprolol tartrate (LOPRESSOR) 25 MG tablet TAKE 1 TABLET BY MOUTH  TWICE DAILY 180 tablet 3  . simvastatin (ZOCOR) 20 MG tablet Take 1 tablet (20 mg total) by mouth at bedtime. 90 tablet 3   No current facility-administered medications on file prior to visit.   Allergies  Allergen Reactions  . Paxil [Paroxetine Hcl]     Can't sleep   Social History   Socioeconomic History  . Marital status: Widowed    Spouse name: Not on file  . Number of children: Not on file  . Years of education: Not on file  . Highest education level: Not on file  Occupational History    . Not on file  Tobacco  Use  . Smoking status: Current Every Day Smoker    Packs/day: 1.00    Years: 55.00    Pack years: 55.00    Types: Cigarettes  . Smokeless tobacco: Current User  Substance and Sexual Activity  . Alcohol use: No  . Drug use: No  . Sexual activity: Not on file  Other Topics Concern  . Not on file  Social History Narrative  . Not on file   Social Determinants of Health   Financial Resource Strain:   . Difficulty of Paying Living Expenses: Not on file  Food Insecurity:   . Worried About Charity fundraiser in the Last Year: Not on file  . Ran Out of Food in the Last Year: Not on file  Transportation Needs:   . Lack of Transportation (Medical): Not on file  . Lack of Transportation (Non-Medical): Not on file  Physical Activity:   . Days of Exercise per Week: Not on file  . Minutes of Exercise per Session: Not on file  Stress:   . Feeling of Stress : Not on file  Social Connections:   . Frequency of Communication with Friends and Family: Not on file  . Frequency of Social Gatherings with Friends and Family: Not on file  . Attends Religious Services: Not on file  . Active Member of Clubs or Organizations: Not on file  . Attends Archivist Meetings: Not on file  . Marital Status: Not on file  Intimate Partner Violence:   . Fear of Current or Ex-Partner: Not on file  . Emotionally Abused: Not on file  . Physically Abused: Not on file  . Sexually Abused: Not on file   No family history on file.    Review of Systems  All other systems reviewed and are negative.      Objective:   Physical Exam Vitals reviewed.  Constitutional:      General: She is not in acute distress.    Appearance: She is well-developed. She is not diaphoretic.  HENT:     Head: Normocephalic and atraumatic.     Right Ear: External ear normal.     Left Ear: External ear normal.     Nose: Nose normal.     Mouth/Throat:     Pharynx: No oropharyngeal exudate.   Eyes:     Conjunctiva/sclera: Conjunctivae normal.     Pupils: Pupils are equal, round, and reactive to light.  Neck:     Thyroid: No thyromegaly.     Vascular: No JVD.  Cardiovascular:     Rate and Rhythm: Tachycardia present. Rhythm irregular.     Heart sounds: Normal heart sounds. No murmur heard.  No friction rub. No gallop.   Pulmonary:     Effort: Pulmonary effort is normal. No respiratory distress.     Breath sounds: Normal breath sounds. No wheezing or rales.  Chest:     Chest wall: No tenderness.  Abdominal:     General: Bowel sounds are normal. There is no distension.     Palpations: Abdomen is soft. There is no mass.     Tenderness: There is no abdominal tenderness. There is no guarding or rebound.  Musculoskeletal:     Cervical back: Neck supple.  Neurological:     Mental Status: She is alert.     Cranial Nerves: No cranial nerve deficit.     Motor: No abnormal muscle tone.     Coordination: Coordination normal.     Deep Tendon Reflexes:  Reflexes are normal and symmetric.  Psychiatric:        Cognition and Memory: Memory is impaired. She exhibits impaired recent memory. She does not exhibit impaired remote memory.           Assessment & Plan:  Controlled type 2 diabetes mellitus without complication, without long-term current use of insulin (HCC) - Plan: Hemoglobin A1c, CBC with Differential/Platelet, COMPLETE METABOLIC PANEL WITH GFR, Lipid panel  Benign essential HTN  Chronic atrial fibrillation (HCC)  Mixed hyperlipidemia  Alzheimer's dementia without behavioral disturbance, unspecified timing of dementia onset ([place])  Neuropathy  At the present time the patient is able to live independently in my estimation.  However I did recommend more immediate supervision of her medication to ensure that there is no errors in administration.  Specifically they do need to continue with the pillbox however I believe it would be beneficial if someone make sure that  the patient took the medication correctly every day.  The patient is not driving.  She does not represent a harm to herself or to others at the present time.  She is not falling.  My only concern is taking the medication incorrectly by accident.  Her blood pressure today is low.  I recommended stopping hydrochlorothiazide.  I will check a CBC, CMP, fasting lipid panel, and hemoglobin A1c.  Ideally I had like her A1c less than 6.5 and her LDL less than 100.  I will continue her pain medication at the present time as she does report near constant neuropathic pain.  At some point she may need 24-hour supervision however thankfully at the present time she has been remarkably stable over the last year and does not seem to require that at the present time.  Also spent more than 10 minutes trying to convince the patient to receive the Covid vaccination

## 2020-01-13 LAB — CBC WITH DIFFERENTIAL/PLATELET
Absolute Monocytes: 744 cells/uL (ref 200–950)
Basophils Absolute: 61 cells/uL (ref 0–200)
Basophils Relative: 0.5 %
Eosinophils Absolute: 134 cells/uL (ref 15–500)
Eosinophils Relative: 1.1 %
HCT: 44.4 % (ref 35.0–45.0)
Hemoglobin: 15.6 g/dL — ABNORMAL HIGH (ref 11.7–15.5)
Lymphs Abs: 3184 cells/uL (ref 850–3900)
MCH: 31.4 pg (ref 27.0–33.0)
MCHC: 35.1 g/dL (ref 32.0–36.0)
MCV: 89.3 fL (ref 80.0–100.0)
MPV: 11.3 fL (ref 7.5–12.5)
Monocytes Relative: 6.1 %
Neutro Abs: 8076 cells/uL — ABNORMAL HIGH (ref 1500–7800)
Neutrophils Relative %: 66.2 %
Platelets: 254 10*3/uL (ref 140–400)
RBC: 4.97 10*6/uL (ref 3.80–5.10)
RDW: 13 % (ref 11.0–15.0)
Total Lymphocyte: 26.1 %
WBC: 12.2 10*3/uL — ABNORMAL HIGH (ref 3.8–10.8)

## 2020-01-13 LAB — COMPLETE METABOLIC PANEL WITH GFR
AG Ratio: 1.6 (calc) (ref 1.0–2.5)
ALT: 8 U/L (ref 6–29)
AST: 20 U/L (ref 10–35)
Albumin: 4.1 g/dL (ref 3.6–5.1)
Alkaline phosphatase (APISO): 85 U/L (ref 37–153)
BUN: 14 mg/dL (ref 7–25)
CO2: 24 mmol/L (ref 20–32)
Calcium: 10.2 mg/dL (ref 8.6–10.4)
Chloride: 100 mmol/L (ref 98–110)
Creat: 0.89 mg/dL (ref 0.60–0.93)
GFR, Est African American: 72 mL/min/{1.73_m2} (ref >=60)
GFR, Est Non African American: 63 mL/min/{1.73_m2} (ref >=60)
Globulin: 2.6 g/dL (calc) (ref 1.9–3.7)
Glucose, Bld: 102 mg/dL — ABNORMAL HIGH (ref 65–99)
Potassium: 3.9 mmol/L (ref 3.5–5.3)
Sodium: 139 mmol/L (ref 135–146)
Total Bilirubin: 0.6 mg/dL (ref 0.2–1.2)
Total Protein: 6.7 g/dL (ref 6.1–8.1)

## 2020-01-13 LAB — LIPID PANEL
Cholesterol: 204 mg/dL — ABNORMAL HIGH (ref <200)
HDL: 35 mg/dL — ABNORMAL LOW (ref >=50)
Non-HDL Cholesterol (Calc): 169 mg/dL (calc) — ABNORMAL HIGH (ref <130)
Total CHOL/HDL Ratio: 5.8 (calc) — ABNORMAL HIGH (ref <5.0)
Triglycerides: 740 mg/dL — ABNORMAL HIGH (ref <150)

## 2020-01-13 LAB — HEMOGLOBIN A1C
Hgb A1c MFr Bld: 5.7 % of total Hgb — ABNORMAL HIGH (ref <5.7)
Mean Plasma Glucose: 117 (calc)
eAG (mmol/L): 6.5 (calc)

## 2020-01-20 ENCOUNTER — Other Ambulatory Visit: Payer: Self-pay

## 2020-01-20 ENCOUNTER — Other Ambulatory Visit: Payer: Medicare Other

## 2020-01-20 DIAGNOSIS — E782 Mixed hyperlipidemia: Secondary | ICD-10-CM

## 2020-01-21 LAB — LIPID PANEL
Cholesterol: 176 mg/dL (ref <200)
HDL: 36 mg/dL — ABNORMAL LOW (ref >=50)
Non-HDL Cholesterol (Calc): 140 mg/dL (calc) — ABNORMAL HIGH (ref <130)
Total CHOL/HDL Ratio: 4.9 (calc) (ref <5.0)
Triglycerides: 517 mg/dL — ABNORMAL HIGH (ref <150)

## 2020-02-12 ENCOUNTER — Other Ambulatory Visit: Payer: Self-pay

## 2020-02-12 MED ORDER — HYDROCODONE-ACETAMINOPHEN 5-325 MG PO TABS
1.0000 | ORAL_TABLET | Freq: Three times a day (TID) | ORAL | 0 refills | Status: DC | PRN
Start: 2020-02-12 — End: 2020-03-14

## 2020-03-14 ENCOUNTER — Other Ambulatory Visit: Payer: Self-pay | Admitting: Family Medicine

## 2020-03-14 MED ORDER — HYDROCODONE-ACETAMINOPHEN 5-325 MG PO TABS
1.0000 | ORAL_TABLET | Freq: Three times a day (TID) | ORAL | 0 refills | Status: DC | PRN
Start: 2020-03-14 — End: 2020-04-13

## 2020-03-14 NOTE — Telephone Encounter (Signed)
Ok to refill??  Last office visit 01/12/2020.  Last refill 02/12/2020.

## 2020-03-14 NOTE — Telephone Encounter (Signed)
Refill  Hydrocodone     [id number], [place] - [address]

## 2020-03-18 ENCOUNTER — Telehealth: Payer: Self-pay

## 2020-03-18 ENCOUNTER — Other Ambulatory Visit: Payer: Self-pay | Admitting: Family Medicine

## 2020-03-18 NOTE — Telephone Encounter (Signed)
error 

## 2020-03-18 NOTE — Telephone Encounter (Signed)
Daughter of Raetta called, Mrs. Puffenbarger has stop taking her sleeping medication, she's been able to sleep better without it. So her daughter just wanted to let Mrs. Nolde Provider know what was going on.

## 2020-03-28 ENCOUNTER — Other Ambulatory Visit: Payer: Self-pay | Admitting: Family Medicine

## 2020-04-13 ENCOUNTER — Telehealth: Payer: Self-pay | Admitting: Family Medicine

## 2020-04-13 ENCOUNTER — Other Ambulatory Visit: Payer: Self-pay

## 2020-04-13 MED ORDER — HYDROCODONE-ACETAMINOPHEN 5-325 MG PO TABS
1.0000 | ORAL_TABLET | Freq: Three times a day (TID) | ORAL | 0 refills | Status: DC | PRN
Start: 2020-04-13 — End: 2020-05-16

## 2020-04-13 NOTE — Telephone Encounter (Signed)
Rx refill request sent to patient Provider

## 2020-04-13 NOTE — Telephone Encounter (Signed)
Refill Hydrocodone    [id number], [place] - [address]

## 2020-04-13 NOTE — Telephone Encounter (Signed)
Patient call and requested rx refill

## 2020-05-16 ENCOUNTER — Other Ambulatory Visit: Payer: Self-pay

## 2020-05-16 MED ORDER — HYDROCODONE-ACETAMINOPHEN 5-325 MG PO TABS
1.0000 | ORAL_TABLET | Freq: Three times a day (TID) | ORAL | 0 refills | Status: DC | PRN
Start: 2020-05-16 — End: 2020-06-20

## 2020-05-16 NOTE — Telephone Encounter (Signed)
Pt needs refill on HYDROcodone-acetaminophen (NORCO/VICODIN) 5-325 MG tablet [[id number]] [id number] [place], [place] - [address]   Pt call back [phone]

## 2020-05-16 NOTE — Telephone Encounter (Signed)
Ok to refill??  Last office visit 01/12/2020.  Last refill 04/13/2020.

## 2020-06-20 ENCOUNTER — Other Ambulatory Visit: Payer: Self-pay | Admitting: Family Medicine

## 2020-06-20 MED ORDER — HYDROCODONE-ACETAMINOPHEN 5-325 MG PO TABS
1.0000 | ORAL_TABLET | Freq: Three times a day (TID) | ORAL | 0 refills | Status: DC | PRN
Start: 2020-06-20 — End: 2020-07-12

## 2020-06-20 NOTE — Telephone Encounter (Signed)
Ok to refill??  Last office visit 01/12/2020.  Last refill 05/16/2020.

## 2020-06-20 NOTE — Telephone Encounter (Signed)
Refill- Hydrocodone

## 2020-07-11 ENCOUNTER — Other Ambulatory Visit: Payer: Self-pay | Admitting: Family Medicine

## 2020-07-11 NOTE — Telephone Encounter (Signed)
Pt daughter called asking for a refill of  HYDROcodone-acetaminophen (NORCO/VICODIN) 5-325   and a new prescription for  simvastatin (ZOCOR) 20 MG tablet  sent to [id number] - [place], [place] -

## 2020-07-12 MED ORDER — HYDROCODONE-ACETAMINOPHEN 5-325 MG PO TABS
1.0000 | ORAL_TABLET | Freq: Three times a day (TID) | ORAL | 0 refills | Status: DC | PRN
Start: 2020-07-12 — End: 2020-07-20

## 2020-07-12 MED ORDER — SIMVASTATIN 20 MG PO TABS
20.0000 mg | ORAL_TABLET | Freq: Every day | ORAL | 3 refills | Status: DC
Start: 2020-07-12 — End: 2021-03-17

## 2020-07-12 NOTE — Telephone Encounter (Signed)
Per chart, Simvastatin has not been refilled since 2017. Please advise.   Ok to refill Hydrocodone/APAP?? Last OV 01/12/2020. Last refill 06/20/2020.

## 2020-07-20 ENCOUNTER — Other Ambulatory Visit: Payer: Self-pay | Admitting: *Deleted

## 2020-07-20 NOTE — Telephone Encounter (Signed)
Received call from patient daughter, [name].   [facility] at [facility]   Requested to have prescription sent to [facility] on [address] in [place].   Ok to re-send?

## 2020-07-21 ENCOUNTER — Telehealth: Payer: Self-pay | Admitting: Family Medicine

## 2020-07-21 MED ORDER — HYDROCODONE-ACETAMINOPHEN 5-325 MG PO TABS
1.0000 | ORAL_TABLET | Freq: Three times a day (TID) | ORAL | 0 refills | Status: DC | PRN
Start: 2020-07-21 — End: 2020-08-19

## 2020-07-21 NOTE — Telephone Encounter (Signed)
Pt daughter called in stating that pt [facility] out [facility] med HYDROcodone-acetaminophen (NORCO/VICODIN) 5-325 MG tablet  And would like this med sent to [facility] on [address].  Cb#: [phone]

## 2020-07-22 NOTE — Telephone Encounter (Signed)
Medication was refilled to [facility] on 07/21/2020.

## 2020-08-18 ENCOUNTER — Other Ambulatory Visit: Payer: Self-pay | Admitting: *Deleted

## 2020-08-18 MED ORDER — ELIQUIS 5 MG PO TABS: 1.0000 | ORAL_TABLET | Freq: Two times a day (BID) | ORAL | 5 refills | Status: DC

## 2020-08-19 ENCOUNTER — Other Ambulatory Visit: Payer: Self-pay | Admitting: Family Medicine

## 2020-08-19 MED ORDER — HYDROCODONE-ACETAMINOPHEN 5-325 MG PO TABS: 1.0000 | ORAL_TABLET | Freq: Three times a day (TID) | ORAL | 0 refills | Status: DC | PRN

## 2020-08-19 NOTE — Telephone Encounter (Signed)
Ok to refill??  Last office visit 01/12/2020.  Last refill 07/21/2020.

## 2020-08-19 NOTE — Telephone Encounter (Signed)
Daughter called requesting refill on HYDROcodone-acetaminophen (NORCO/VICODIN) 5-325 MG tablet CB# [phone]

## 2020-09-15 ENCOUNTER — Other Ambulatory Visit: Payer: Self-pay | Admitting: Family Medicine

## 2020-09-15 MED ORDER — HYDROCODONE-ACETAMINOPHEN 5-325 MG PO TABS: 1.0000 | ORAL_TABLET | Freq: Three times a day (TID) | ORAL | 0 refills | Status: DC | PRN

## 2020-09-15 NOTE — Telephone Encounter (Signed)
Ok to refill??  Last office visit 01/12/2020.  Last refill 08/20/2019.

## 2020-09-15 NOTE — Telephone Encounter (Signed)
Patient's daughter [name] called to request a refill of HYDROcodone-acetaminophen (NORCO/VICODIN) 5-325 MG tablet [[id number]]  Pharmacy confirmed as   [id number] [place], [place] - [address]  [address], [place] [address]  Phone:  [phone] Fax:  [phone]  DEA #:  [id number]   Patient scheduled for office visit with provider on 09/23/2020.  Please advise at [phone].

## 2020-09-23 ENCOUNTER — Ambulatory Visit: Payer: Medicare Other | Admitting: Family Medicine

## 2020-10-24 ENCOUNTER — Telehealth: Payer: Self-pay | Admitting: Family Medicine

## 2020-10-24 NOTE — Progress Notes (Signed)
  Chronic Care Management   Note  10/24/2020 Name: Catherine Orozco MRN: [id number] DOB: 02/22/1941  Catherine Orozco is a 80 y.o. year old female who is a primary care patient of Dennard Schaumann, Cammie Mcgee, MD. I reached out to Tamsen Meek by phone today in response to a referral sent by Ms. Catherine Champagne Welch's PCP, [name], MD.   Catherine Orozco was given information about Chronic Care Management services today including:  CCM service includes personalized support from designated clinical staff supervised by her physician, including individualized plan of care and coordination with other care providers 24/7 contact phone numbers for assistance for urgent and routine care needs. Service will only be billed when office clinical staff spend 20 minutes or more in a month to coordinate care. Only one practitioner may furnish and bill the service in a calendar month. The patient may stop CCM services at any time (effective at the end of the month) by phone call to the office staff.   PAULA BAILEY verbally agreed to assistance and services provided by embedded care coordination/care management team today.  Follow up plan:   Tatjana Secretary/administrator

## 2020-10-24 NOTE — Chronic Care Management (AMB) (Signed)
  Chronic Care Management   Outreach Note  10/24/2020 Name: Catherine Orozco MRN: [id number] DOB: 1940/07/28  Referred by: [name], MD Reason for referral : No chief complaint on file.   An unsuccessful telephone outreach was attempted today. The patient was referred to the pharmacist for assistance with care management and care coordination.   Follow Up Plan:   Tatjana Dellinger Upstream Scheduler

## 2020-10-27 NOTE — Progress Notes (Signed)
Subjective:   Catherine Orozco is a 80 y.o. female who presents for Medicare Annual (Subsequent) preventive examination.  Review of Systems    N/A  Cardiac Risk Factors include: advanced age (>52men, >64 women);dyslipidemia;hypertension     Objective:    Today's Vitals   10/28/20 1519 10/28/20 1520  BP: 120/72   Temp: 98.1 F (36.7 C)   TempSrc: Oral   Weight: 156 lb (70.8 kg)   Height: 5' 4.5" (1.638 m)   PainSc:  10-Worst pain ever   Body mass index is 26.36 kg/m.  Advanced Directives 10/28/2020 01/12/2014 01/08/2014  Does Patient Have a Medical Advance Directive? No No No  Would patient like information on creating a medical advance directive? No - Patient declined - Yes - Educational materials given    Current Medications (verified) Outpatient Encounter Medications as of 10/28/2020  Medication Sig   DULoxetine (CYMBALTA) 30 MG capsule TAKE 1 CAPSULE BY MOUTH EVERY DAY   fenofibrate (TRICOR) 145 MG tablet TAKE 1 TABLET BY MOUTH DAILY   gabapentin (NEURONTIN) 600 MG tablet TAKE 1 TABLET BY MOUTH 3  TIMES DAILY   hydrochlorothiazide (HYDRODIURIL) 25 MG tablet TAKE 1 TABLET BY MOUTH  DAILY   HYDROcodone-acetaminophen (NORCO/VICODIN) 5-325 MG tablet Take 1 tablet by mouth 3 (three) times daily as needed for moderate pain.   metoprolol tartrate (LOPRESSOR) 25 MG tablet TAKE 1 TABLET BY MOUTH  TWICE DAILY   simvastatin (ZOCOR) 20 MG tablet Take 1 tablet (20 mg total) by mouth at bedtime.   apixaban (ELIQUIS) 5 MG TABS tablet Take 1 tablet (5 mg total) by mouth 2 (two) times daily. (Patient not taking: Reported on 10/28/2020)   memantine (NAMENDA) 10 MG tablet Take 1 tablet (10 mg total) by mouth 2 (two) times daily. (Patient not taking: Reported on 10/28/2020)   No facility-administered encounter medications on file as of 10/28/2020.    Allergies (verified) Paxil [paroxetine hcl]   History: Past Medical History:  Diagnosis Date   A-fib [facility])    Anxiety    Arrhythmia     afib   Arthritis    Atrial fibrillation ([place]) 12/23/2012   Overview:  Last Assessment & Plan:  Maintaining normal sinus rhythm. Suggested she continue on anticoagulation and metoprolol    BP (high blood pressure) 10/25/2015   Calculus of gallbladder 12/11/2013   Overview:  Last Assessment & Plan:  Acceptable risk for upcoming gallbladder surgery. Suggested she stop her anticoagulation 3 days prior to surgery. She is maintaining normal sinus rhythm. 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 No pertinent family history. Social History   Socioeconomic History   Marital status: Widowed    Spouse name: Not on file   Number of children: Not on file   Years of  education: Not on file   Highest education level: Not on file  Occupational History   Not on file  Tobacco Use   Smoking status: Every Day    Packs/day: 1.00    Years: 55.00    Pack years: 55.00    Types: Cigarettes   Smokeless tobacco: Current  Substance and Sexual Activity   Alcohol use: No   Drug use: No   Sexual activity: Not on file  Other Topics Concern   Not on file  Social History Narrative   Not on file   Social Determinants of Health   Financial Resource Strain: Low Risk    Difficulty of Paying Living Expenses: Not hard at all  Food Insecurity: No Food Insecurity   Worried About Sales executive in the Last Year: Never true   Ran Out of Food in the Last Year: Never true  Transportation Needs: No Transportation Needs   Lack of Transportation (Medical): No   Lack of Transportation (Non-Medical): No  Physical Activity: Inactive   Days of Exercise per Week: 0 days   Minutes of Exercise per Session: 0 min  Stress: No Stress Concern Present   Feeling of Stress : Not at all  Social Connections: Socially Isolated   Frequency of Communication with Friends and Family: More than three times a week   Frequency of Social Gatherings with Friends and Family: More than three times a week   Attends Religious Services: Never   Marine scientist or Organizations: No   Attends Archivist Meetings: Never   Marital Status: Widowed    Tobacco Counseling Ready to quit: Not Answered Counseling given: Not Answered   Clinical Intake:  Pre-visit preparation completed: Yes  Pain : 0-10 Pain Score: 10-Worst pain ever Pain Type: Chronic pain Pain Location: Back (legs, feet, head) Pain Onset: More than a month ago Pain Frequency: Constant Pain Relieving Factors: Pain medications  Pain Relieving Factors: Pain medications  Nutritional Risks: Unintentional weight loss Diabetes: No  How often do you need to have someone help you when you read instructions, pamphlets, or other written materials from your doctor or pharmacy?: 1 - Never  Diabetic?No   Interpreter Needed?: No  Information entered by :: [place] of Daily Living In your present state of health, do you have any difficulty performing the following activities: 10/28/2020  Hearing? Y  Vision? Y  Comment has macular degeneration  Difficulty concentrating or making decisions? Y  Walking or climbing stairs? Y  Dressing or bathing? N  Doing errands, shopping? Y  Preparing Food and eating ? Y  Comment has issues preparing meals  Using the Toilet? N  In the past six months, have you accidently  leaked urine? N  Do you have problems with loss of bowel control? Y  Comment Lose stools  Managing your Medications? N  Managing your Finances? N  Housekeeping or managing your Housekeeping? N  Some recent data might be hidden    Patient Care Team: [name], MD as PCP - General (Family Medicine) Edythe Clarity, [facility] as Pharmacist (Pharmacist)  Indicate any recent Medical Services you may have received from other than [facility] providers in the past year (date may be approximate).     Assessment:   This is a routine wellness examination for Lyrik.  Hearing/Vision screen Vision Screening - Comments:: Patient gets eye exams once per year. 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 1 No  Number falls in past yr: 1 -  Injury with Fall? 0 -  Risk for fall due to : Impaired balance/gait;History of fall(s) -  Follow up Falls evaluation completed;Falls prevention discussed -    FALL RISK PREVENTION PERTAINING TO THE HOME:  Any stairs in or around the home? No  If so, are there any without handrails? No  Home free of loose throw rugs in walkways, pet beds, electrical cords, etc? Yes  Adequate lighting in your home to reduce risk of falls? Yes   ASSISTIVE DEVICES UTILIZED TO PREVENT FALLS:  Life alert? No  Use of a cane, walker or w/c? No  Grab bars in the bathroom? Yes  Shower chair or bench in shower? No  Elevated toilet seat or a handicapped toilet? No   TIMED UP AND GO:  Was the test performed? Yes .  Length of time to  ambulate 10 feet: 6 sec.   Gait unsteady without use of assistive device, provider informed and interventions were implemented  Cognitive Function:        Immunizations Immunization History  Administered Date(s) Administered   Influenza-Unspecified 03/14/2018   Pneumococcal Polysaccharide-23 09/26/2015    TDAP status: Due, Education has been provided regarding the importance of this vaccine. Advised may receive this vaccine at local pharmacy or Health Dept. Aware to provide a copy of the vaccination record if obtained from local pharmacy or Health Dept. Verbalized acceptance and understanding.  Flu Vaccine status: Due, Education has been provided regarding the importance of this vaccine. Advised may receive this vaccine at local pharmacy or Health Dept. Aware to provide a copy of the vaccination record if obtained from local pharmacy or Health Dept. Verbalized acceptance and understanding.  Pneumococcal vaccine status: Due, Education has been provided regarding the importance of this vaccine. Advised may receive this vaccine at local pharmacy or Health Dept. Aware to provide a copy of the vaccination record if obtained from local pharmacy or Health Dept. Verbalized acceptance and understanding.  Covid-19 vaccine status: Declined, Education has been provided regarding the importance of this vaccine but patient still declined. Advised may receive this vaccine at local pharmacy or Health Dept.or vaccine clinic. Aware to provide a copy of the vaccination record if obtained from local pharmacy or Health Dept. Verbalized acceptance and understanding.  Qualifies for Shingles Vaccine? Yes   Zostavax completed No   Shingrix Completed?: No.    Education has been provided regarding the importance of this vaccine. Patient has been advised to call insurance company to determine out of pocket expense if they have not yet received this vaccine. Advised may also receive vaccine at local pharmacy or Health  Dept. Verbalized acceptance and understanding.  Screening Tests Health Maintenance  Topic Date Due   COVID-19 Vaccine (1) Never done   FOOT EXAM  Never done   URINE MICROALBUMIN  Never done   Hepatitis C Screening  Never done  TETANUS/TDAP  Never done   Zoster Vaccines- Shingrix (1 of 2) Never done   PNA vac Low Risk Adult (2 of 2 - PCV13) 09/25/2016   OPHTHALMOLOGY EXAM  05/21/2018   HEMOGLOBIN A1C  07/11/2020   INFLUENZA VACCINE  12/12/2020   DEXA SCAN  Completed   HPV VACCINES  Aged Out    Health Maintenance  Health Maintenance Due  Topic Date Due   COVID-19 Vaccine (1) Never done   FOOT EXAM  Never done   URINE MICROALBUMIN  Never done   Hepatitis C Screening  Never done   TETANUS/TDAP  Never done   Zoster Vaccines- Shingrix (1 of 2) Never done   PNA vac Low Risk Adult (2 of 2 - PCV13) 09/25/2016   OPHTHALMOLOGY EXAM  05/21/2018   HEMOGLOBIN A1C  07/11/2020    Colorectal cancer screening: No longer required.   Mammogram status: No longer required due to patient prefence .  Bone Density status: Ordered 10/28/2020. Pt provided with contact info and advised to call to schedule appt.  Lung Cancer Screening: (Low Dose CT Chest recommended if Age 24-80 years, 30 pack-year currently smoking OR have quit w/in 15years.) does qualify.   Lung Cancer Screening Referral: Yes  Additional Screening:  Hepatitis C Screening: does qualify;   Vision Screening: Recommended annual ophthalmology exams for early detection of glaucoma and other disorders of the eye. Is the patient up to date with their annual eye exam?  Yes  Who is the provider or what is the name of the office in which the patient attends annual eye exams? [facility] If pt is not established with a provider, would they like to be referred to a provider to establish care? No .   Dental Screening: Recommended annual dental exams for proper oral hygiene  Community Resource Referral / Chronic Care  Management: CRR required this visit?  No   CCM required this visit?  No      Plan:     I have personally reviewed and noted the following in the patient's chart:   Medical and social history Use of alcohol, tobacco or illicit drugs  Current medications and supplements including opioid prescriptions.  Functional ability and status Nutritional status Physical activity Advanced directives List of other physicians Hospitalizations, surgeries, and ER visits in previous 12 months Vitals Screenings to include cognitive, depression, and falls Referrals and appointments  In addition, I have reviewed and discussed with patient certain preventive protocols, quality metrics, and best practice recommendations. A written personalized care plan for preventive services as well as general preventive health recommendations were provided to patient.     Ofilia Neas, LPN   [id number]/66/2947   Nurse Notes: Please see PHQ9 results. Also recommended that patient use walker or cane to assist with ambulation and to prevent falls. Patient has concerns of appetite as well as concerns of side effects to Gabapentin

## 2020-10-28 ENCOUNTER — Other Ambulatory Visit: Payer: Self-pay

## 2020-10-28 ENCOUNTER — Encounter: Payer: Self-pay | Admitting: Family Medicine

## 2020-10-28 ENCOUNTER — Ambulatory Visit (INDEPENDENT_AMBULATORY_CARE_PROVIDER_SITE_OTHER): Payer: Medicare Other

## 2020-10-28 ENCOUNTER — Ambulatory Visit (INDEPENDENT_AMBULATORY_CARE_PROVIDER_SITE_OTHER): Payer: Medicare Other | Admitting: Family Medicine

## 2020-10-28 VITALS — BP 120/72 | Temp 98.1°F | Ht 64.5 in | Wt 156.0 lb

## 2020-10-28 VITALS — BP 122/70 | HR 80 | Temp 98.4°F | Resp 14 | Ht 64.5 in | Wt 156.0 lb

## 2020-10-28 DIAGNOSIS — I10 Essential (primary) hypertension: Secondary | ICD-10-CM | POA: Diagnosis not present

## 2020-10-28 DIAGNOSIS — Z780 Asymptomatic menopausal state: Secondary | ICD-10-CM

## 2020-10-28 DIAGNOSIS — Z0000 Encounter for general adult medical examination without abnormal findings: Secondary | ICD-10-CM

## 2020-10-28 DIAGNOSIS — I482 Chronic atrial fibrillation, unspecified: Secondary | ICD-10-CM | POA: Diagnosis not present

## 2020-10-28 DIAGNOSIS — G309 Alzheimer's disease, unspecified: Secondary | ICD-10-CM

## 2020-10-28 DIAGNOSIS — Z0001 Encounter for general adult medical examination with abnormal findings: Secondary | ICD-10-CM

## 2020-10-28 DIAGNOSIS — E119 Type 2 diabetes mellitus without complications: Secondary | ICD-10-CM | POA: Diagnosis not present

## 2020-10-28 DIAGNOSIS — F0280 Dementia in other diseases classified elsewhere without behavioral disturbance: Secondary | ICD-10-CM

## 2020-10-28 DIAGNOSIS — R7309 Other abnormal glucose: Secondary | ICD-10-CM | POA: Diagnosis not present

## 2020-10-28 DIAGNOSIS — F17200 Nicotine dependence, unspecified, uncomplicated: Secondary | ICD-10-CM | POA: Diagnosis not present

## 2020-10-28 DIAGNOSIS — E782 Mixed hyperlipidemia: Secondary | ICD-10-CM

## 2020-10-28 NOTE — Progress Notes (Signed)
Subjective:    Patient ID: Catherine Orozco, female    DOB: November 29, 1940, 80 y.o.   MRN: [id number]  Patient is here today for a checkup.  Past medical history is significant for dementia and age-related cognitive decline.  She also has atrial fibrillation.  She is currently rate controlled on anticoagulation.  She also has chronic peripheral neuropathy and severe pain in her legs due to this.  She was on hydrocodone 3 times a day in addition to gabapentin.  However her daughter states that she stopped the gabapentin several months ago due to dizziness on the medication.  At that time she was taking 600 mg 3 times a day.  However she has not required the medication in the last several months and has not called the office complaining of more pain.  Today, I asked the patient how she takes her medication and she states that she takes it out of the bottle as prescribed.  In other words she is not using a pillbox.  Therefore the patient is in charge of her medication.  I then asked how often she takes her pain medication.  She has no consistent answer.  She states that she does not.  Her daughter then states that she takes it 2-3 times a day.  She states that she may take it once a day.  I believe the patient is confused and does not have insight into how often she is taking the medication.  Patient still lives at home alone.  Daughter is concerned that she may be at increased risk of falling when taking a shower.  Therefore the daughter would like a shower chair that they can use to put in the bath.  The patient has a tub with large sides that she has to step over to take a shower and the family is concerned that because of her balance issues she may be at more risk of falling Past Medical History:  Diagnosis Date   A-fib [facility])    Anxiety    Arrhythmia    afib   Arthritis    Atrial fibrillation ([place]) 12/23/2012   Overview:  Last Assessment & Plan:  Maintaining normal sinus rhythm. Suggested she continue on  anticoagulation and metoprolol    BP (high blood pressure) 10/25/2015   Calculus of gallbladder 12/11/2013   Overview:  Last Assessment & Plan:  Acceptable risk for upcoming gallbladder surgery. Suggested she stop her anticoagulation 3 days prior to surgery. She is maintaining normal sinus rhythm. 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;L4-5   CKD (chronic kidney disease) stage 3, GFR 30-59 ml/min (HCC)    Colon polyps    due again 04/2017   Depression    Gallstones    Hypertension    Insomnia    Neuropathy    Peripheral neuropathy    no lumbar radiculopathy   Past Surgical History:  Procedure Laterality Date   ABDOMINAL HYSTERECTOMY     CHOLECYSTECTOMY N/A 01/12/2014   Procedure: LAPAROSCOPIC CHOLECYSTECTOMY ;  Surgeon: Coralie Keens, MD;  Location: [facility] OR;  Service: General;  Laterality: N/A;   Current Outpatient Medications on File Prior to Visit  Medication Sig Dispense Refill   apixaban (ELIQUIS) 5 MG TABS tablet Take 1 tablet (5 mg total) by mouth 2 (two) times daily. (Patient not taking: Reported on 10/28/2020)  60 tablet 5   DULoxetine (CYMBALTA) 30 MG capsule TAKE 1 CAPSULE BY MOUTH EVERY DAY 90 capsule 3   fenofibrate (TRICOR) 145 MG tablet TAKE 1 TABLET BY MOUTH DAILY 90 tablet 2   gabapentin (NEURONTIN) 600 MG tablet TAKE 1 TABLET BY MOUTH 3  TIMES DAILY (Patient not taking: Reported on 10/28/2020) 270 tablet 3   HYDROcodone-acetaminophen (NORCO/VICODIN) 5-325 MG tablet Take 1 tablet by mouth 3 (three) times daily as needed for moderate pain. 90 tablet 0   memantine (NAMENDA) 10 MG tablet Take 1 tablet (10 mg total) by mouth 2 (two) times daily. (Patient not taking: Reported on 10/28/2020) 60 tablet 5   metoprolol tartrate (LOPRESSOR) 25 MG tablet TAKE 1 TABLET BY MOUTH  TWICE DAILY 180 tablet 3   simvastatin (ZOCOR) 20 MG tablet Take 1 tablet (20  mg total) by mouth at bedtime. 90 tablet 3   No current facility-administered medications on file prior to visit.   Allergies  Allergen Reactions   Paxil [Paroxetine Hcl]     Can't sleep   Social History   Socioeconomic History   Marital status: Widowed    Spouse name: Not on file   Number of children: Not on file   Years of education: Not on file   Highest education level: Not on file  Occupational History   Not on file  Tobacco Use   Smoking status: Every Day    Packs/day: 1.00    Years: 55.00    Pack years: 55.00    Types: Cigarettes   Smokeless tobacco: Current  Substance and Sexual Activity   Alcohol use: No   Drug use: No   Sexual activity: Not on file  Other Topics Concern   Not on file  Social History Narrative   Not on file   Social Determinants of Health   Financial Resource Strain: Low Risk    Difficulty of Paying Living Expenses: Not hard at all  Food Insecurity: No Food Insecurity   Worried About Charity fundraiser in the Last Year: Never true   [place] in the Last Year: Never true  Transportation Needs: No Transportation Needs   Lack of Transportation (Medical): No   Lack of Transportation (Non-Medical): No  Physical Activity: Inactive   Days of Exercise per Week: 0 days   Minutes of Exercise per Session: 0 min  Stress: No Stress Concern Present   Feeling of Stress : Not at all  Social Connections: Socially Isolated   Frequency of Communication with Friends and Family: More than three times a week   Frequency of Social Gatherings with Friends and Family: More than three times a week   Attends Religious Services: Never   Marine scientist or Organizations: No   Attends Archivist Meetings: Never   Marital Status: Widowed  Human resources officer Violence: Not At Risk   Fear of Current or Ex-Partner: No   Emotionally Abused: No   Physically Abused: No   Sexually Abused: No   No family history on file.    Review of Systems   All other systems reviewed and are negative.     Objective:   Physical Exam Vitals reviewed.  Constitutional:      General: She is not in acute distress.    Appearance: She is well-developed. She is not diaphoretic.  HENT:     Head: Normocephalic and atraumatic.     Right Ear: External ear normal.     Left Ear:  External ear normal.     Nose: Nose normal.     Mouth/Throat:     Pharynx: No oropharyngeal exudate.  Eyes:     Conjunctiva/sclera: Conjunctivae normal.     Pupils: Pupils are equal, round, and reactive to light.  Neck:     Thyroid: No thyromegaly.     Vascular: No JVD.  Cardiovascular:     Rate and Rhythm: Tachycardia present. Rhythm irregular.     Heart sounds: Normal heart sounds. No murmur heard.   No friction rub. No gallop.  Pulmonary:     Effort: Pulmonary effort is normal. No respiratory distress.     Breath sounds: Normal breath sounds. No wheezing or rales.  Chest:     Chest wall: No tenderness.  Abdominal:     General: Bowel sounds are normal. There is no distension.     Palpations: Abdomen is soft. There is no mass.     Tenderness: There is no abdominal tenderness. There is no guarding or rebound.  Musculoskeletal:     Cervical back: Neck supple.  Neurological:     Mental Status: She is alert.     Cranial Nerves: No cranial nerve deficit.     Motor: No abnormal muscle tone.     Coordination: Coordination normal.     Deep Tendon Reflexes: Reflexes are normal and symmetric.  Psychiatric:        Cognition and Memory: Memory is impaired. She exhibits impaired recent memory. She does not exhibit impaired remote memory.          Assessment & Plan:  Benign essential HTN - Plan: CBC with Differential/Platelet, COMPLETE METABOLIC PANEL WITH GFR, Lipid panel  Controlled type 2 diabetes mellitus without complication, without long-term current use of insulin (HCC) - Plan: Hemoglobin A1c  Alzheimer's dementia without behavioral disturbance, unspecified  timing of dementia onset ([place])  Mixed hyperlipidemia  Chronic atrial fibrillation ([place])  At the present time the patient is able to live independently in my estimation.  However I did recommend more immediate supervision of her medication to ensure that there is no errors in administration.  Patient is not able to administer her own medications.  Therefore I was adamant that the daughter needs to arrange the pillbox for her.  I will not continue gabapentin.  The patient has not even required it over the last 2 to 3 months therefore I see no reason to even decrease the dose.  I will also look to decrease the amount of hydrocodone that she is taking given the fact the patient is unable to correctly identify how much she is taking on a daily basis.  Therefore I plan to decrease it to 60 tablets/month twice a day in the future as I am concerned about medication adherence and errors.  I do believe the patient would benefit from a shower chair to help reduce the risk of falls.  The daughter is concerned about home health nurse and I will be glad to consult home health nursing however the present time she does not require any skilled nursing.  The daughter was really just wanting someone to help stay with her.  I explained that this is not covered by insurance and that this would require a private contract.

## 2020-10-28 NOTE — Patient Instructions (Signed)
Catherine Orozco , Thank you for taking time to come for your Medicare Wellness Visit. I appreciate your ongoing commitment to your health goals. Please review the following plan we discussed and let me know if I can assist you in the future.   Screening recommendations/referrals: Colonoscopy: No longer required Mammogram: Patient declined Bone Density: currently due, orders placed this visit Recommended yearly ophthalmology/optometry visit for glaucoma screening and checkup Recommended yearly dental visit for hygiene and checkup  Vaccinations: Influenza vaccine: Patient declined  Pneumococcal vaccine: Currently due, patient declined  Tdap vaccine: Currently due, you may await and injury to receive  Shingles vaccine: Currently due, we recommend that you receive at your local pharmacy    Advanced directives: Advance directive discussed with you today. Even though you declined this today please call our office should you change your mind and we can give you the proper paperwork for you to fill out.   Conditions/risks identified: None   Next appointment: None    Preventive Care 65 Years and Older, Female Preventive care refers to lifestyle choices and visits with your health care provider that can promote health and wellness. What does preventive care include? A yearly physical exam. This is also called an annual well check. Dental exams once or twice a year. Routine eye exams. Ask your health care provider how often you should have your eyes checked. Personal lifestyle choices, including: Daily care of your teeth and gums. Regular physical activity. Eating a healthy diet. Avoiding tobacco and drug use. Limiting alcohol use. Practicing safe sex. Taking low-dose aspirin every day. Taking vitamin and mineral supplements as recommended by your health care provider. What happens during an annual well check? The services and screenings done by your health care provider during your annual  well check will depend on your age, overall health, lifestyle risk factors, and family history of disease. Counseling  Your health care provider may ask you questions about your: Alcohol use. Tobacco use. Drug use. Emotional well-being. Home and relationship well-being. Sexual activity. Eating habits. History of falls. Memory and ability to understand (cognition). Work and work Statistician. Reproductive health. Screening  You may have the following tests or measurements: Height, weight, and BMI. Blood pressure. Lipid and cholesterol levels. These may be checked every 5 years, or more frequently if you are over 57 years old. Skin check. Lung cancer screening. You may have this screening every year starting at age 58 if you have a 30-pack-year history of smoking and currently smoke or have quit within the past 15 years. Fecal occult blood test (FOBT) of the stool. You may have this test every year starting at age 78. Flexible sigmoidoscopy or colonoscopy. You may have a sigmoidoscopy every 5 years or a colonoscopy every 10 years starting at age 77. Hepatitis C blood test. Hepatitis B blood test. Sexually transmitted disease (STD) testing. Diabetes screening. This is done by checking your blood sugar (glucose) after you have not eaten for a while (fasting). You may have this done every 1-3 years. Bone density scan. This is done to screen for osteoporosis. You may have this done starting at age 37. Mammogram. This may be done every 1-2 years. Talk to your health care provider about how often you should have regular mammograms. Talk with your health care provider about your test results, treatment options, and if necessary, the need for more tests. Vaccines  Your health care provider may recommend certain vaccines, such as: Influenza vaccine. This is recommended every year. Tetanus, diphtheria, and  acellular pertussis (Tdap, Td) vaccine. You may need a Td booster every 10 years. Zoster  vaccine. You may need this after age 31. Pneumococcal 13-valent conjugate (PCV13) vaccine. One dose is recommended after age 74. Pneumococcal polysaccharide (PPSV23) vaccine. One dose is recommended after age 11. Talk to your health care provider about which screenings and vaccines you need and how often you need them. This information is not intended to replace advice given to you by your health care provider. Make sure you discuss any questions you have with your health care provider. Document Released: 05/27/2015 Document Revised: 01/18/2016 Document Reviewed: 03/01/2015 Elsevier Interactive Patient Education  2017 [employer] Prevention in the Home Falls can cause injuries. They can happen to people of all ages. There are many things you can do to make your home safe and to help prevent falls. What can I do on the outside of my home? Regularly fix the edges of walkways and driveways and fix any cracks. Remove anything that might make you trip as you walk through a door, such as a raised step or threshold. Trim any bushes or trees on the path to your home. Use bright outdoor lighting. Clear any walking paths of anything that might make someone trip, such as rocks or tools. Regularly check to see if handrails are loose or broken. Make sure that both sides of any steps have handrails. Any raised decks and porches should have guardrails on the edges. Have any leaves, snow, or ice cleared regularly. Use sand or salt on walking paths during winter. Clean up any spills in your garage right away. This includes oil or grease spills. What can I do in the bathroom? Use night lights. Install grab bars by the toilet and in the tub and shower. Do not use towel bars as grab bars. Use non-skid mats or decals in the tub or shower. If you need to sit down in the shower, use a plastic, non-slip stool. Keep the floor dry. Clean up any water that spills on the floor as soon as it happens. Remove  soap buildup in the tub or shower regularly. Attach bath mats securely with double-sided non-slip rug tape. Do not have throw rugs and other things on the floor that can make you trip. What can I do in the bedroom? Use night lights. Make sure that you have a light by your bed that is easy to reach. Do not use any sheets or blankets that are too big for your bed. They should not hang down onto the floor. Have a firm chair that has side arms. You can use this for support while you get dressed. Do not have throw rugs and other things on the floor that can make you trip. What can I do in the kitchen? Clean up any spills right away. Avoid walking on wet floors. Keep items that you use a lot in easy-to-reach places. If you need to reach something above you, use a strong step stool that has a grab bar. Keep electrical cords out of the way. Do not use floor polish or wax that makes floors slippery. If you must use wax, use non-skid floor wax. Do not have throw rugs and other things on the floor that can make you trip. What can I do with my stairs? Do not leave any items on the stairs. Make sure that there are handrails on both sides of the stairs and use them. Fix handrails that are broken or loose. Make sure that  handrails are as long as the stairways. Check any carpeting to make sure that it is firmly attached to the stairs. Fix any carpet that is loose or worn. Avoid having throw rugs at the top or bottom of the stairs. If you do have throw rugs, attach them to the floor with carpet tape. Make sure that you have a light switch at the top of the stairs and the bottom of the stairs. If you do not have them, ask someone to add them for you. What else can I do to help prevent falls? Wear shoes that: Do not have high heels. Have rubber bottoms. Are comfortable and fit you well. Are closed at the toe. Do not wear sandals. If you use a stepladder: Make sure that it is fully opened. Do not climb a  closed stepladder. Make sure that both sides of the stepladder are locked into place. Ask someone to hold it for you, if possible. Clearly mark and make sure that you can see: Any grab bars or handrails. First and last steps. Where the edge of each step is. Use tools that help you move around (mobility aids) if they are needed. These include: Canes. Walkers. Scooters. Crutches. Turn on the lights when you go into a dark area. Replace any light bulbs as soon as they burn out. Set up your furniture so you have a clear path. Avoid moving your furniture around. If any of your floors are uneven, fix them. If there are any pets around you, be aware of where they are. Review your medicines with your doctor. Some medicines can make you feel dizzy. This can increase your chance of falling. Ask your doctor what other things that you can do to help prevent falls. This information is not intended to replace advice given to you by your health care provider. Make sure you discuss any questions you have with your health care provider. Document Released: 02/24/2009 Document Revised: 10/06/2015 Document Reviewed: 06/04/2014 Elsevier Interactive Patient Education  2017 [employer].

## 2020-11-01 ENCOUNTER — Other Ambulatory Visit: Payer: Self-pay | Admitting: Family Medicine

## 2020-11-01 NOTE — Telephone Encounter (Signed)
Ok to refill??  Last office visit 10/28/2020.  Last refill 09/15/2020.

## 2020-11-01 NOTE — Telephone Encounter (Signed)
Pt daughter called in asking for a refill of HYDROcodone-acetaminophen (NORCO/VICODIN) 5-325 MG tablet sent to pharmacy.  Cb#: [phone]

## 2020-11-03 LAB — CBC WITH DIFFERENTIAL/PLATELET
Absolute Monocytes: 670 cells/uL (ref 200–950)
Basophils Absolute: 54 cells/uL (ref 0–200)
Basophils Relative: 0.5 %
Eosinophils Absolute: 86 cells/uL (ref 15–500)
Eosinophils Relative: 0.8 %
HCT: 45.2 % — ABNORMAL HIGH (ref 35.0–45.0)
Hemoglobin: 15.5 g/dL (ref 11.7–15.5)
Lymphs Abs: 1987 cells/uL (ref 850–3900)
MCH: 29.2 pg (ref 27.0–33.0)
MCHC: 34.3 g/dL (ref 32.0–36.0)
MCV: 85.3 fL (ref 80.0–100.0)
MPV: 11.6 fL (ref 7.5–12.5)
Monocytes Relative: 6.2 %
Neutro Abs: 8003 cells/uL — ABNORMAL HIGH (ref 1500–7800)
Neutrophils Relative %: 74.1 %
Platelets: 305 10*3/uL (ref 140–400)
RBC: 5.3 10*6/uL — ABNORMAL HIGH (ref 3.80–5.10)
RDW: 13.8 % (ref 11.0–15.0)
Total Lymphocyte: 18.4 %
WBC: 10.8 10*3/uL (ref 3.8–10.8)

## 2020-11-03 LAB — COMPLETE METABOLIC PANEL WITH GFR
AG Ratio: 1.3 (calc) (ref 1.0–2.5)
ALT: 20 U/L (ref 6–29)
AST: 34 U/L (ref 10–35)
Albumin: 3.8 g/dL (ref 3.6–5.1)
Alkaline phosphatase (APISO): 114 U/L (ref 37–153)
BUN/Creatinine Ratio: 10 (calc) (ref 6–22)
BUN: 11 mg/dL (ref 7–25)
CO2: 24 mmol/L (ref 20–32)
Calcium: 9.6 mg/dL (ref 8.6–10.4)
Chloride: 101 mmol/L (ref 98–110)
Creat: 1.14 mg/dL — ABNORMAL HIGH (ref 0.60–0.93)
GFR, Est African American: 53 mL/min/{1.73_m2} — ABNORMAL LOW (ref >=60)
GFR, Est Non African American: 46 mL/min/{1.73_m2} — ABNORMAL LOW (ref >=60)
Globulin: 3 g/dL (calc) (ref 1.9–3.7)
Glucose, Bld: 134 mg/dL — ABNORMAL HIGH (ref 65–99)
Potassium: 3.6 mmol/L (ref 3.5–5.3)
Sodium: 138 mmol/L (ref 135–146)
Total Bilirubin: 0.9 mg/dL (ref 0.2–1.2)
Total Protein: 6.8 g/dL (ref 6.1–8.1)

## 2020-11-03 LAB — LIPID PANEL
Cholesterol: 187 mg/dL (ref <200)
HDL: 34 mg/dL — ABNORMAL LOW (ref >=50)
LDL Cholesterol (Calc): 108 mg/dL (calc) — ABNORMAL HIGH
Non-HDL Cholesterol (Calc): 153 mg/dL (calc) — ABNORMAL HIGH (ref <130)
Total CHOL/HDL Ratio: 5.5 (calc) — ABNORMAL HIGH (ref <5.0)
Triglycerides: 341 mg/dL — ABNORMAL HIGH (ref <150)

## 2020-11-03 LAB — TEST AUTHORIZATION

## 2020-11-03 LAB — HEMOGLOBIN A1C W/OUT EAG: Hgb A1c MFr Bld: 5.3 % of total Hgb (ref <5.7)

## 2020-11-03 MED ORDER — HYDROCODONE-ACETAMINOPHEN 5-325 MG PO TABS: 1.0000 | ORAL_TABLET | Freq: Three times a day (TID) | ORAL | 0 refills | Status: DC | PRN

## 2020-12-01 ENCOUNTER — Other Ambulatory Visit: Payer: Self-pay | Admitting: Family Medicine

## 2020-12-01 MED ORDER — HYDROCODONE-ACETAMINOPHEN 5-325 MG PO TABS: 1.0000 | ORAL_TABLET | Freq: Three times a day (TID) | ORAL | 0 refills | Status: DC | PRN

## 2020-12-01 NOTE — Telephone Encounter (Signed)
Pt's daughter called in requesting a refill of HYDROcodone-acetaminophen (NORCO/VICODIN) 5-325  Please advise  Cb#: [phone]

## 2020-12-29 ENCOUNTER — Other Ambulatory Visit: Payer: Self-pay | Admitting: Family Medicine

## 2020-12-29 ENCOUNTER — Ambulatory Visit: Payer: Medicare Other

## 2021-01-17 ENCOUNTER — Other Ambulatory Visit: Payer: Self-pay

## 2021-01-17 MED ORDER — HYDROCODONE-ACETAMINOPHEN 5-325 MG PO TABS: 1.0000 | ORAL_TABLET | Freq: Three times a day (TID) | ORAL | 0 refills | Status: DC | PRN

## 2021-01-17 NOTE — Telephone Encounter (Signed)
Pt's daughter called in requesting a refill of HYDROcodone-acetaminophen (NORCO/VICODIN) 5-325 MG tablet.   Cb#: [phone]

## 2021-01-17 NOTE — Telephone Encounter (Signed)
Ok to refill??  Last office visit 10/28/2020.  Last refill 12/01/2020.

## 2021-02-13 ENCOUNTER — Other Ambulatory Visit: Payer: Self-pay | Admitting: Family Medicine

## 2021-03-14 ENCOUNTER — Telehealth: Payer: Self-pay | Admitting: Pharmacist

## 2021-03-14 NOTE — Progress Notes (Signed)
    Chronic Care Management Pharmacy Assistant   Name: QUANESHIA WAREING  MRN: [id number] DOB: Mar 14, 1941  Catherine Orozco is an 80 y.o. year old female who presents for his initial CCM visit with the clinical pharmacist.  Reason for Encounter: Chart Prep    Conditions to be addressed/monitored: Afib, HTN, Type 2 DM, HLD.  Primary concerns for visit include: HTN, Type 2 DM.   Recent office visits:  10/28/20 Dr. Dennard Schaumann For medication review. No medication changes. 10/28/20 Launa Grill, LPN. For medicare wellness. No medication changes.  Recent consult visits:  None in the last six months  Hospital visits:  None in the last six months  Medication History: Simvastatin 20 mg 01/19/21 90 DS.   Medications: Outpatient Encounter Medications as of 03/14/2021  Medication Sig   DULoxetine (CYMBALTA) 30 MG capsule TAKE 1 CAPSULE BY MOUTH EVERY DAY   ELIQUIS 5 MG TABS tablet TAKE 1 TABLET BY MOUTH TWICE A DAY   fenofibrate (TRICOR) 145 MG tablet TAKE 1 TABLET BY MOUTH DAILY   gabapentin (NEURONTIN) 600 MG tablet TAKE 1 TABLET BY MOUTH 3  TIMES DAILY (Patient not taking: Reported on 10/28/2020)   HYDROcodone-acetaminophen (NORCO/VICODIN) 5-325 MG tablet Take 1 tablet by mouth 3 (three) times daily as needed for moderate pain.   hydrOXYzine (ATARAX/VISTARIL) 25 MG tablet TAKE 1 TABLET BY MOUTH EVERYDAY AT BEDTIME   memantine (NAMENDA) 10 MG tablet Take 1 tablet (10 mg total) by mouth 2 (two) times daily. (Patient not taking: Reported on 10/28/2020)   metoprolol tartrate (LOPRESSOR) 25 MG tablet TAKE 1 TABLET BY MOUTH  TWICE DAILY   simvastatin (ZOCOR) 20 MG tablet Take 1 tablet (20 mg total) by mouth at bedtime.   No facility-administered encounter medications on file as of 03/14/2021.    Have you seen any other providers since your last visit? Patients daughter stated no.   Any changes in your medications or health? Patients daughter stated no.   Any side effects from any  medications? Patients daughter stated no.   Do you have an symptoms or problems not managed by your medications? Patients daughter stated no.   Any concerns about your health right now? Patients daughter stated no.   Has your provider asked that you check blood pressure, blood sugar, or follow special diet at home? Patients daughter stated she checks her blood pressure for her mom sometimes.   Do you get any type of exercise on a regular basis? Patients daughter stated no.   Can you think of a goal you would like to reach for your health? Patients daughter stated she would like to see her mom more active.   Do you have any problems getting your medications? Patients daughter stated no.   Is there anything that you would like to discuss during the appointment? Patients daughter stated no.   Please bring medications and supplements to appointment, patients daughter stated the patients sister peggy Sharlett Iles will be taking the call. She was reminded of her moms appointment on 03/16/21 at 2 pm.  [place], [place] Pharmacist Assistant [phone]

## 2021-03-15 ENCOUNTER — Other Ambulatory Visit: Payer: Self-pay | Admitting: Family Medicine

## 2021-03-15 NOTE — Telephone Encounter (Signed)
Chart shows this rx d/c'd 10/28/20 at [place]. Please advise. Thank you!

## 2021-03-16 ENCOUNTER — Ambulatory Visit (INDEPENDENT_AMBULATORY_CARE_PROVIDER_SITE_OTHER): Payer: Medicare Other | Admitting: Pharmacist

## 2021-03-16 DIAGNOSIS — I480 Paroxysmal atrial fibrillation: Secondary | ICD-10-CM

## 2021-03-16 DIAGNOSIS — I10 Essential (primary) hypertension: Secondary | ICD-10-CM

## 2021-03-16 DIAGNOSIS — E119 Type 2 diabetes mellitus without complications: Secondary | ICD-10-CM

## 2021-03-16 DIAGNOSIS — E785 Hyperlipidemia, unspecified: Secondary | ICD-10-CM

## 2021-03-16 NOTE — Patient Instructions (Addendum)
Visit Information   Goals Addressed             This Visit's Progress    Track and Manage My Blood Pressure-Hypertension       Timeframe:  Long-Range Goal Priority:  High Start Date:  03/16/21                           Expected End Date:   09/13/21                    Follow Up Date 06/16/21    - check blood pressure weekly - choose a place to take my blood pressure (home, clinic or office, retail store) - write blood pressure results in a log or diary    Why is this important?   You won't feel high blood pressure, but it can still hurt your blood vessels.  High blood pressure can cause heart or kidney problems. It can also cause a stroke.  Making lifestyle changes like losing a little weight or eating less salt will help.  Checking your blood pressure at home and at different times of the day can help to control blood pressure.  If the doctor prescribes medicine remember to take it the way the doctor ordered.  Call the office if you cannot afford the medicine or if there are questions about it.     Notes:        Patient Care Plan: General Pharmacy (Adult)     Problem Identified: Afib, HTN, Type II DM, Depression/Anxiety, HLD   Priority: High  Onset Date: 03/16/2021     Long-Range Goal: Patient-Specific Goal   Start Date: 03/16/2021  Expected End Date: 09/13/2021  This Visit's Progress: On track  Priority: High  Note:   Current Barriers:  Unable to independently monitor therapeutic efficacy Unable to self administer medications as prescribed Does not adhere to prescribed medication regimen  Pharmacist Clinical Goal(s):  Patient will achieve adherence to monitoring guidelines and medication adherence to achieve therapeutic efficacy achieve improvement in adherence as evidenced by fill dates achieve ability to self administer medications as prescribed through use of pill packs as evidenced by patient report through collaboration with Catherine Orozco and provider.    Interventions: 1:1 collaboration with Catherine Frizzle, MD regarding development and update of comprehensive plan of care as evidenced by provider attestation and co-signature Inter-disciplinary care team collaboration (see longitudinal plan of care) Comprehensive medication review performed; medication list updated in electronic medical record  Hypertension (BP goal <130/80) -Controlled -Current treatment: HCTZ 25mg  - patient did not have on hand Metoprolol tartrate 25mg  BID - did not have on hand -Medications previously tried: none noted  -Current home readings: checks sometimes no logs present -Current exercise habits: minimal -Denies hypotensive/hypertensive symptoms -Educated on BP goals and benefits of medications for prevention of heart attack, stroke and kidney damage; Importance of home blood pressure monitoring; Symptoms of hypotension and importance of maintaining adequate hydration; -Counseled to monitor BP at home periodically, document, and provide log at future appointments -Recommended to continue current medication Need to confirm with PCP but I believe she should be taking both of this.  Especially metoprolol in the setting of Afib.  Hyperlipidemia: (LDL goal < 70) -Uncontrolled -Current treatment: Simvastatin 20mg  daily Fenofibrate 168mcg daily - none on hand (not taking currently) -Medications previously tried: none noted   -Educated on Cholesterol goals;  Benefits of statin for ASCVD risk reduction; Importance of limiting foods  high in cholesterol; -Recommended to continue current medication I believe she needs to be on fenofibrate due to TG > 300 in June lipid panel, will confirm with PCP.  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 -Recommended to continue current medication Update recommended screenings  Atrial Fibrillation (Goal: prevent stroke and major bleeding) -Controlled -Current treatment: Rate control: metoprolol tartrate 25mg  BID - not taking Anticoagulation: Eliquis 5mg  BID -Medications previously tried: none noted  -Counseled on increased risk of stroke due to Afib and benefits of anticoagulation for stroke prevention; importance of adherence to anticoagulant exactly as prescribed; bleeding risk associated with Eliquis and importance of self-monitoring for signs/symptoms of bleeding; -Denies any abnormal bruising/bleeding - copay is increased as they are in donut hole but tolerable -Recommended to continue current medication  Health Maintenance There are a lot of gaps in fill history and uncertainty at home on which medications she is taking.  I feel it is most beneficial to put her in pill packs so that I can facilitate the prescriptions being filled and make sure that she is taking the right medication regimen.  Sister has agreed to trial this process and we will facilitate all refills moving forward this way.  Patient Goals/Self-Care Activities Patient will:  - take medications as prescribed as evidenced by patient report and record review focus on medication adherence by pill packs check blood pressure periodically, document, and provide at future appointments  Follow Up Plan: The care management team will reach out to the patient again over the next 180 days.      Catherine Orozco was given information about Chronic Care Management services today including:  CCM service includes personalized support  from designated clinical staff supervised by her physician, including individualized plan of care and coordination with other care providers 24/7 contact phone numbers for assistance for urgent and routine care needs. Standard insurance, coinsurance, copays and deductibles apply for chronic care management only during months in which we provide at least 20 minutes of these services. Most insurances cover these services at 100%, however patients may be responsible for any copay, coinsurance and/or deductible if applicable. This service may help you avoid the need for more expensive face-to-face services. Only one practitioner may furnish and bill the service in a calendar month. The patient may stop CCM services at any time (effective at the end of the month) by phone call to the office staff.  Patient agreed to services and verbal consent obtained.   The patient verbalized understanding of instructions, educational materials, and care plan provided today and agreed to receive a mailed copy of patient instructions, educational materials, and care plan.  Telephone follow up appointment with pharmacy team member scheduled for: 3 months  Edythe Clarity, Catherine Orozco

## 2021-03-16 NOTE — Progress Notes (Signed)
Chronic Care Management Pharmacy Note  03/16/2021 Name:  Catherine Orozco MRN:  [id number] DOB:  09-Dec-1940  Summary: Initial visit with PharmD.  Majority of time spent on clarification of medication regimen.  Lots of missing meds at home based on current list.    Recommendations/Changes made from today's visit: Pill packs/synchronization through [place] with PCP on med regimen moving forward  Plan: FU 3 months   Subjective: Catherine Orozco is an 80 y.o. year old female who is a primary patient of Pickard, Cammie Mcgee, MD.  The CCM team was consulted for assistance with disease management and care coordination needs.    Engaged with patient by telephone for initial visit in response to provider referral for pharmacy case management and/or care coordination services.   Consent to Services:  The patient was given the following information about Chronic Care Management services today, agreed to services, and gave verbal consent: 1. CCM service includes personalized support from designated clinical staff supervised by the primary care provider, including individualized plan of care and coordination with other care providers 2. 24/7 contact phone numbers for assistance for urgent and routine care needs. 3. Service will only be billed when office clinical staff spend 20 minutes or more in a month to coordinate care. 4. Only one practitioner may furnish and bill the service in a calendar month. 5.The patient may stop CCM services at any time (effective at the end of the month) by phone call to the office staff. 6. The patient will be responsible for cost sharing (co-pay) of up to 20% of the service fee (after annual deductible is met). Patient agreed to services and consent obtained.  Patient Care Team: [name], MD as PCP - General (Family Medicine) Edythe Clarity, [facility] as Pharmacist (Pharmacist)  Recent office visits:  10/28/20 Dr. Dennard Schaumann For medication review.  No medication changes. 10/28/20 Launa Grill, LPN. For medicare wellness. No medication changes.   Recent consult visits:  None in the last six months   Hospital visits:  None in the last six months   Medication History: Simvastatin 20 mg 01/19/21 90 DS.      Objective:  Lab Results  Component Value Date   CREATININE 1.14 (H) 10/28/2020   BUN 11 10/28/2020   GFRNONAA 46 (L) 10/28/2020   GFRAA 53 (L) 10/28/2020   NA 138 10/28/2020   K 3.6 10/28/2020   CALCIUM 9.6 10/28/2020   CO2 24 10/28/2020   GLUCOSE 134 (H) 10/28/2020    Lab Results  Component Value Date/Time   HGBA1C 5.3 10/28/2020 04:28 PM   HGBA1C 5.7 (H) 01/12/2020 02:26 PM    Last diabetic Eye exam:  Lab Results  Component Value Date/Time   HMDIABEYEEXA No Retinopathy 05/21/2017 12:00 AM    Last diabetic Foot exam: No results found for: HMDIABFOOTEX   Lab Results  Component Value Date   CHOL 187 10/28/2020   HDL 34 (L) 10/28/2020   LDLCALC 108 (H) 10/28/2020   TRIG 341 (H) 10/28/2020   CHOLHDL 5.5 (H) 10/28/2020    Hepatic Function Latest Ref Rng & Units 10/28/2020 01/12/2020 11/07/2018  Total Protein 6.1 - 8.1 g/dL 6.8 6.7 6.8  Albumin 3.6 - 5.1 g/dL - - -  AST 10 - 35 U/L 34 20 21  ALT 6 - 29 U/L _0 Alk Phosphatase 33 - 130 U/L - - -  Total Bilirubin 0.2 - 1.2 mg/dL 0.9 0.6 0.5    Lab Results  Component Value Date/Time   TSH 1.945 07/08/2014 10:28 AM   TSH 2.706 11/09/2013 04:42 PM    CBC Latest Ref Rng & Units 10/28/2020 01/12/2020 11/07/2018  WBC 3.8 - 10.8 Thousand/uL 10.8 12.2(H) 12.3(H)  Hemoglobin 11.7 - 15.5 g/dL 15.5 15.6(H) 16.0(H)  Hematocrit 35.0 - 45.0 % 45.2(H) 44.4 45.6(H)  Platelets 140 - 400 Thousand/uL 305 254 265    No results found for: VD25OH  Clinical ASCVD: Yes  The 10-year ASCVD risk score (Arnett DK, et al., 2019) is: 55.6%   Values used to calculate the score:     Age: 80 years     Sex: Female     Is Non-Hispanic African American: No     Diabetic:  Yes     Tobacco smoker: Yes     Systolic Blood Pressure: 974 mmHg     Is BP treated: Yes     HDL Cholesterol: 34 mg/dL     Total Cholesterol: 187 mg/dL    Depression screen [facility] 2/9 10/28/2020 07/02/2017 07/30/2016  Decreased Interest 1 0 0  Down, Depressed, Hopeless 1 0 0  PHQ - 2 Score 2 0 0  Altered sleeping 0 0 0  Tired, decreased energy 3 2 0  Change in appetite 3 1 0  Feeling bad or failure about yourself  1 1 0  Trouble concentrating 0 1 0  Moving slowly or fidgety/restless 0 0 0  Suicidal thoughts 0 0 0  PHQ-9 Score 9 5 0  Difficult doing work/chores Very difficult Somewhat difficult Not difficult at all     Social History   Tobacco Use  Smoking Status Every Day   Packs/day: 1.00   Years: 55.00   Pack years: 55.00   Types: Cigarettes  Smokeless Tobacco Current   BP Readings from Last 3 Encounters:  10/28/20 122/70  10/28/20 120/72  01/12/20 102/60   Pulse Readings from Last 3 Encounters:  10/28/20 80  01/12/20 (!) 102  11/07/18 100   Wt Readings from Last 3 Encounters:  10/28/20 156 lb (70.8 kg)  10/28/20 156 lb (70.8 kg)  01/12/20 172 lb (78 kg)   BMI Readings from Last 3 Encounters:  10/28/20 26.36 kg/m  10/28/20 26.36 kg/m  01/12/20 29.07 kg/m    Assessment/Interventions: Review of patient past medical history, allergies, medications, health status, including review of consultants reports, laboratory and other test data, was performed as part of comprehensive evaluation and provision of chronic care management services.   SDOH:  (Social Determinants of Health) assessments and interventions performed: Yes  Financial Resource Strain: Low Risk    Difficulty of Paying Living Expenses: Not hard at all    SDOH Screenings   Alcohol Screen: Low Risk    Last Alcohol Screening Score (AUDIT): 0  Depression (PHQ2-9): Medium Risk   PHQ-2 Score: 9  Financial Resource Strain: Low Risk    Difficulty of Paying Living Expenses: Not hard at all  Food  Insecurity: No Food Insecurity   Worried About Charity fundraiser in the Last Year: Never true   Ran Out of Food in the Last Year: Never true  Housing: Low Risk    Last Housing Risk Score: 0  Physical Activity: Inactive   Days of Exercise per Week: 0 days   Minutes of Exercise per Session: 0 min  Social Connections: Socially Isolated   Frequency of Communication with Friends and Family: More than three times a week   Frequency of Social Gatherings with Friends and Family: More than  three times a week   Attends Religious Services: Never   Active Member of Clubs or Organizations: No   Attends Archivist Meetings: Never   Marital Status: Widowed  Stress: No Stress Concern Present   Feeling of Stress : Not at all  Tobacco Use: High Risk   Smoking Tobacco Use: Every Day   Smokeless Tobacco Use: Current   Passive Exposure: Not on file  Transportation Needs: No Transportation Needs   Lack of Transportation (Medical): No   Lack of Transportation (Non-Medical): No    CCM Care Plan  Allergies  Allergen Reactions   Paxil [Paroxetine Hcl]     Can't sleep    Medications Reviewed Today     Reviewed by Edythe Clarity, [facility] (Pharmacist) on 03/16/21 at [place] List Status: <None>   Medication Order Taking? Sig Documenting Provider Last Dose Status Informant  DULoxetine (CYMBALTA) 30 MG capsule [id number] Yes TAKE 1 CAPSULE BY MOUTH EVERY DAY [name], MD Taking Active   ELIQUIS 5 MG TABS tablet [id number] Yes TAKE 1 TABLET BY MOUTH TWICE A DAY [name], MD Taking Active   fenofibrate (TRICOR) 145 MG tablet [id number] No TAKE 1 TABLET BY MOUTH DAILY  Patient not taking: No sig reported   [name], MD Not Taking Active   gabapentin (NEURONTIN) 600 MG tablet [id number] Yes TAKE 1 TABLET BY MOUTH 3  TIMES DAILY [name], MD Taking Active   hydrochlorothiazide (HYDRODIURIL) 25 MG tablet [id number] Yes TAKE 1 TABLET BY MOUTH  DAILY [name], MD Taking Active   HYDROcodone-acetaminophen (NORCO/VICODIN) 5-325 MG tablet [id number] Yes Take 1 tablet by mouth 3 (three) times daily as needed for moderate pain. [name], MD Taking Active   hydrOXYzine (ATARAX/VISTARIL) 25 MG tablet [id number] Yes TAKE 1 TABLET BY MOUTH EVERYDAY AT BEDTIME [name], MD Taking Active   memantine [facility]) 10 MG tablet [id number] No Take 1 tablet (10 mg total) by mouth 2 (two) times daily.  Patient not taking: Reported on 03/16/2021   [name], MD Not Taking Active   metoprolol tartrate (LOPRESSOR) 25 MG tablet [id number] Yes TAKE 1 TABLET BY MOUTH  TWICE DAILY [name], MD Taking Active   simvastatin (ZOCOR) 20 MG tablet [id number] Yes Take 1 tablet (20 mg total) by mouth at bedtime. [name], MD Taking Active             Patient Active Problem List   Diagnosis Date Noted   Controlled type 2 diabetes mellitus without complication ([place]) 94/85/4627   BP (high blood pressure) 10/25/2015   Dysuria 10/25/2015   Urethral caruncle 10/25/2015   Colon polyps    Anxiety state 04/13/2014   Abdominal pain, generalized 04/13/2014   Back ache 04/13/2014   Disorder of peripheral nervous system 04/13/2014   Major depressive disorder with single episode 04/13/2014   S/P laparoscopic cholecystectomy 01/12/2014   Diarrhea 01/01/2014   D (diarrhea) 01/01/2014   Symptomatic cholelithiasis 12/11/2013   Calculus of gallbladder 12/11/2013   Hyperlipidemia 08/07/2013   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   Atrial fibrillation ([place]) 12/23/2012   Smoker 12/23/2012   Essential hypertension 12/23/2012   Anxiety    Depression    Insomnia    Chronic back pain    Peripheral neuropathy     Immunization History  Administered Date(s) Administered   Influenza-Unspecified 03/14/2018   Pneumococcal Polysaccharide-23 09/26/2015    Conditions to be addressed/monitored:  Afib,  HTN, Type II DM,  Depression/Anxiety, HLD  Care Plan : General Pharmacy (Adult)  Updates made by Edythe Clarity, RPH since 03/16/2021 12:00 AM     Problem: Afib, HTN, Type II DM, Depression/Anxiety, HLD   Priority: High  Onset Date: 03/16/2021     Long-Range Goal: Patient-Specific Goal   Start Date: 03/16/2021  Expected End Date: 09/13/2021  This Visit's Progress: On track  Priority: High  Note:   Current Barriers:  Unable to independently monitor therapeutic efficacy Unable to self administer medications as prescribed Does not adhere to prescribed medication regimen  Pharmacist Clinical Goal(s):  Patient will achieve adherence to monitoring guidelines and medication adherence to achieve therapeutic efficacy achieve improvement in adherence as evidenced by fill dates achieve ability to self administer medications as prescribed through use of pill packs as evidenced by patient report through collaboration with PharmD and provider.   Interventions: 1:1 collaboration with [name], MD regarding development and update of comprehensive plan of care as evidenced by provider attestation and co-signature Inter-disciplinary care team collaboration (see longitudinal plan of care) Comprehensive medication review performed; medication list updated in electronic medical record  Hypertension (BP goal <130/80) -Controlled -Current treatment: HCTZ 90m - patient did not have on hand Metoprolol tartrate 262mBID - did not have on hand -Medications previously tried: none noted  -Current home readings: checks sometimes no logs present -Current exercise habits: minimal -Denies hypotensive/hypertensive symptoms -Educated on BP goals and benefits of medications for prevention of heart attack, stroke and kidney damage; Importance of home blood pressure monitoring; Symptoms of hypotension and importance of maintaining adequate hydration; -Counseled to monitor BP at home periodically, document, and provide  log at future appointments -Recommended to continue current medication Need to confirm with PCP but I believe she should be taking both of this.  Especially metoprolol in the setting of Afib.  Hyperlipidemia: (LDL goal < 70) -Uncontrolled -Current treatment: Simvastatin 2025maily Fenofibrate 145m48maily - none on hand (not taking currently) -Medications previously tried: none noted   -Educated on Cholesterol goals;  Benefits of statin for ASCVD risk reduction; Importance of limiting foods high in cholesterol; -Recommended to continue current medication I believe she needs to be on fenofibrate due to TG > 300 in June lipid panel, will confirm with PCP.  Diabetes w/ neuropathy (A1c goal <7%) -Controlled -Current medications: Gabapentin 600mg20m prn Cymbalta 30mg 32my - not taking 0 on hand -Medications previously tried: none noted  -Current home glucose readings fasting glucose: not checking post prandial glucose: not checking  -Denies hypoglycemic/hyperglycemic symptoms -Educated on A1c and blood sugar goals; -Counseled to check feet daily and get yearly eye exams -Due for eye exam/foot exam/and urine microalbumin -Not taking Cymbalta and using gabapentin sparingly - she has plenty of this left. -Recommended to continue current medication Update recommended screenings  Atrial Fibrillation (Goal: prevent stroke and major bleeding) -Controlled -Current treatment: Rate control: metoprolol tartrate 25mg B3m not taking Anticoagulation: Eliquis 5mg BID77medications previously tried: none noted  -Counseled on increased risk of stroke due to Afib and benefits of anticoagulation for stroke prevention; importance of adherence to anticoagulant exactly as prescribed; bleeding risk associated with Eliquis and importance of self-monitoring for signs/symptoms of bleeding; -Denies any abnormal bruising/bleeding - copay is increased as they are in donut hole but  tolerable -Recommended to continue current medication  Health Maintenance There are a lot of gaps in fill history and uncertainty at home on which medications she is taking.  I feel it is most beneficial to put her in pill packs so that I can facilitate the prescriptions being filled and make sure that she is taking the right medication regimen.  Sister has agreed to trial this process and we will facilitate all refills moving forward this way.  Patient Goals/Self-Care Activities Patient will:  - take medications as prescribed as evidenced by patient report and record review focus on medication adherence by pill packs check blood pressure periodically, document, and provide at future appointments  Follow Up Plan: The care management team will reach out to the patient again over the next 180 days.        Medication Assistance: None required.  Patient affirms current coverage meets needs.  Compliance/Adherence/Medication fill history: Care Gaps: Urine microalbumin Foot exam Eye exam  Star-Rating Drugs: Simvastatin 52m 01/20/21 90ds  Patient's preferred pharmacy is:  [id number] [place] [address] [place]: [phone]: [phone] (OpFlorenceCANew [facility] Ca[place]hone: [phone]: [phone] OppenheimHARMACY [id number] [address] [place] [address]hone: [phone]: [phone] - Encinitaselivery ([facility]) - [address]hone: [phone]: [phone] - GrPreston HeightsNC[place] [address] SuMineral PointC[place]hone: [phone]: [phone] pill box? No - takes  from bottles Pt endorses 75% compliance  We discussed: Benefits of medication synchronization, packaging and delivery as well as enhanced pharmacist oversight with [facility]. Patient decided to: Utilize [facility] for medication synchronization, packaging and delivery Verbal consent obtained for [facility] enhanced pharmacy services (medication synchronization, adherence packaging, delivery coordination). A medication sync plan was created to allow patient to get all medications delivered once every 30 to 90 days per patient preference. Patient understands they have freedom to choose pharmacy and clinical pharmacist will coordinate care between all prescribers and [facility].  Care Plan and Follow Up Patient Decision:  Patient agrees to Care Plan and Follow-up.  Plan: The care management team will reach out to the patient again over the next 90 days.  ChBeverly MilchPharmD Clinical Pharmacist BrNevada [phone]

## 2021-03-17 ENCOUNTER — Telehealth: Payer: Self-pay | Admitting: *Deleted

## 2021-03-17 ENCOUNTER — Telehealth: Payer: Self-pay | Admitting: Pharmacist

## 2021-03-17 MED ORDER — APIXABAN 5 MG PO TABS: 5.0000 mg | ORAL_TABLET | Freq: Two times a day (BID) | ORAL | 3 refills | Status: DC

## 2021-03-17 MED ORDER — HYDROCHLOROTHIAZIDE 25 MG PO TABS: 25.0000 mg | ORAL_TABLET | Freq: Every day | ORAL | 3 refills | Status: DC

## 2021-03-17 MED ORDER — METOPROLOL TARTRATE 25 MG PO TABS: 25.0000 mg | ORAL_TABLET | Freq: Two times a day (BID) | ORAL | 3 refills | Status: DC

## 2021-03-17 MED ORDER — SIMVASTATIN 20 MG PO TABS: 20.0000 mg | ORAL_TABLET | Freq: Every day | ORAL | 3 refills | Status: DC

## 2021-03-17 MED ORDER — FENOFIBRATE 145 MG PO TABS: 145.0000 mg | ORAL_TABLET | Freq: Every day | ORAL | 3 refills | Status: DC

## 2021-03-17 MED ORDER — HYDROXYZINE HCL 25 MG PO TABS: ORAL_TABLET | ORAL | 3 refills | Status: AC

## 2021-03-17 NOTE — Progress Notes (Signed)
    Chronic Care Management Pharmacy Assistant   Name: Catherine Orozco  MRN: [id number] DOB: [birth year]  Reason for Encounter: CCM Care Plan    Medications: Outpatient Encounter Medications as of 03/17/2021  Medication Sig   DULoxetine (CYMBALTA) 30 MG capsule TAKE 1 CAPSULE BY MOUTH EVERY DAY   ELIQUIS 5 MG TABS tablet TAKE 1 TABLET BY MOUTH TWICE A DAY   fenofibrate (TRICOR) 145 MG tablet TAKE 1 TABLET BY MOUTH DAILY (Patient not taking: No sig reported)   gabapentin (NEURONTIN) 600 MG tablet TAKE 1 TABLET BY MOUTH 3  TIMES DAILY   hydrochlorothiazide (HYDRODIURIL) 25 MG tablet TAKE 1 TABLET BY MOUTH  DAILY   HYDROcodone-acetaminophen (NORCO/VICODIN) 5-325 MG tablet Take 1 tablet by mouth 3 (three) times daily as needed for moderate pain.   hydrOXYzine (ATARAX/VISTARIL) 25 MG tablet TAKE 1 TABLET BY MOUTH EVERYDAY AT BEDTIME   memantine (NAMENDA) 10 MG tablet Take 1 tablet (10 mg total) by mouth 2 (two) times daily. (Patient not taking: Reported on 03/16/2021)   metoprolol tartrate (LOPRESSOR) 25 MG tablet TAKE 1 TABLET BY MOUTH  TWICE DAILY   simvastatin (ZOCOR) 20 MG tablet Take 1 tablet (20 mg total) by mouth at bedtime.   No facility-administered encounter medications on file as of 03/17/2021.   Reviewed the patients initial visit reinsured it was completed per the pharmacist Leata Mouse request. Printed the CCM care plan. Mailed the patient CCM care plan to their most recent address on file.  Follow-Up:Pharmacist Review  Charlann Lange, [place] Pharmacist Assistant [phone]

## 2021-03-17 NOTE — Telephone Encounter (Signed)
-----   Message from Edythe Clarity, [facility] sent at 03/17/2021  8:29 AM EDT ----- Marykay Lex!  This patient has chosen to use [facility] for packaging and adherence. Can we get new scripts for the following: Eliquis 5mg  BID Fenofibrate 176mcg HCTZ 25mg  Hydroxyzine 25mg  hs Metoprolol tartrate 25mg  BID Simvastatin 20mg  daily  Pharmacy - [facility].  Thanks!!    When is your last day?!

## 2021-03-17 NOTE — Telephone Encounter (Signed)
Prescription sent to pharmacy.

## 2021-03-17 NOTE — Telephone Encounter (Signed)
Received call from [facility] with [facility].   Reports that patient daughter is not sure that patient should be taking Metoprolol 25mg  PO BID. States that she recalls discussing low BP with PCP and thought she was supposed to stop Metoprolol and continue HCTZ.   Reports that she has not been taking Metoprolol x months. Patient daughter states that she stopped Metoprolol, but has continued HCTZ. Reports that BP readings have been WNL.  Per PCP, continue HCTZ. D/C Metoprolol from list.   Call placed to pharmacy and left message on VM.

## 2021-03-28 ENCOUNTER — Other Ambulatory Visit: Payer: Self-pay | Admitting: Family Medicine

## 2021-03-28 MED ORDER — HYDROCODONE-ACETAMINOPHEN 5-325 MG PO TABS: 1.0000 | ORAL_TABLET | Freq: Three times a day (TID) | ORAL | 0 refills | Status: DC | PRN

## 2021-03-28 NOTE — Telephone Encounter (Signed)
Received call from patient's daughter [name] to request refill of HYDROcodone-acetaminophen (NORCO/VICODIN) 5-325 MG tablet [[id number]]   Patient has a couple of pills left.   [facility]  [id number] [place], [place] - [address]  [address] [place], [place] [address]  Phone:  [phone]  Fax:  [phone]  DEA #:  [id number]  Please advise at [phone]

## 2021-03-28 NOTE — Telephone Encounter (Signed)
Ok to refill??  Last office visit 10/28/2020.  Last refill 01/17/2021.

## 2021-04-04 ENCOUNTER — Telehealth: Payer: Self-pay | Admitting: Pharmacist

## 2021-04-04 NOTE — Progress Notes (Addendum)
    Chronic Care Management Pharmacy Assistant   Name: POLETTE NOFSINGER  MRN: [id number] DOB: 1940-06-04   Reason for Encounter: Monthly Medication Coordination Call     Recent office visits:  None noted.   Recent consult visits:  None noted.   Hospital visits:  None in previous 6 months  Medications: Outpatient Encounter Medications as of 04/04/2021  Medication Sig   apixaban (ELIQUIS) 5 MG TABS tablet Take 1 tablet (5 mg total) by mouth 2 (two) times daily.   DULoxetine (CYMBALTA) 30 MG capsule TAKE 1 CAPSULE BY MOUTH EVERY DAY   fenofibrate (TRICOR) 145 MG tablet Take 1 tablet (145 mg total) by mouth daily.   gabapentin (NEURONTIN) 600 MG tablet TAKE 1 TABLET BY MOUTH 3  TIMES DAILY   hydrochlorothiazide (HYDRODIURIL) 25 MG tablet Take 1 tablet (25 mg total) by mouth daily.   HYDROcodone-acetaminophen (NORCO/VICODIN) 5-325 MG tablet Take 1 tablet by mouth 3 (three) times daily as needed for moderate pain.   hydrOXYzine (ATARAX/VISTARIL) 25 MG tablet TAKE 1 TABLET BY MOUTH EVERYDAY AT BEDTIME   simvastatin (ZOCOR) 20 MG tablet Take 1 tablet (20 mg total) by mouth at bedtime.   No facility-administered encounter medications on file as of 04/04/2021.    Reviewed chart for medication changes ahead of medication coordination call.  No OVs, Consults, or hospital visits since last care coordination call/Pharmacist visit. (If appropriate, list visit date, provider name)  No medication changes indicated OR if recent visit, treatment plan here.  BP Readings from Last 3 Encounters:  10/28/20 122/70  10/28/20 120/72  01/12/20 102/60    Lab Results  Component Value Date   HGBA1C 5.3 10/28/2020     Patient obtains medications through Adherence Packaging  30 Days   Last adherence delivery included: (medication name and frequency)  Onboarded 03/17/21 Eliquis 5mg  BID Fenofibrate 170mcg HCTZ 25mg  Hydroxyzine 25mg  hs Metoprolol tartrate 25mg  BID Simvastatin 20mg   daily   Patient is due for next adherence delivery on: 04/17/2021. Requested to change delivery date to 04/14/21.  Called patient and reviewed medications and coordinated delivery.   This delivery to include:  Eliquis 5mg  BID Fenofibrate 163mcg HCTZ 25mg  Hydroxyzine 25mg  hs Simvastatin 20mg  daily [facility]   Patient declined the following medications (meds) due to (reason) Simvastatin 20mg  daily [facility])  Metoprolol tartrate 25mg  BID - discontinued from med list per chart review on 03/17/21  Patient needs refills for None.  Daughter asked to change delivery date of 04/17/2021 to 04/14/2021 advised patient that pharmacy will contact them the morning of delivery.   Care Gaps  AWV: overdue Colonoscopy: done 04/26/14  DM Eye Exam: overdue 05/21/18 DM Foot Exam: never  Microalbumin: never HbgAIC: 10/28/20 (5.3) DEXA: ordered last done 07/09/08  Mammogram: 11/21/05 (?aged out)   Star Rating Drugs: simvastatin (ZOCOR) 20 MG tablet - last filled 01/19/21 90 days   Future Appointments  Date Time Provider [place]  07/06/2021  2:00 PM BSFM-CCM PHARMACIST B[facility]-BS[facility] None    Jobe Gibbon, CCMA Clinical Pharmacist Assistant  [phone]  9 minutes spent in review, coordination, and documentation.  Reviewed by: Beverly Milch, PharmD Clinical Pharmacist [phone]

## 2021-04-12 DIAGNOSIS — E785 Hyperlipidemia, unspecified: Secondary | ICD-10-CM | POA: Diagnosis not present

## 2021-04-12 DIAGNOSIS — I480 Paroxysmal atrial fibrillation: Secondary | ICD-10-CM

## 2021-04-12 DIAGNOSIS — E119 Type 2 diabetes mellitus without complications: Secondary | ICD-10-CM

## 2021-04-12 DIAGNOSIS — I10 Essential (primary) hypertension: Secondary | ICD-10-CM | POA: Diagnosis not present

## 2021-05-03 ENCOUNTER — Other Ambulatory Visit: Payer: Self-pay | Admitting: Family Medicine

## 2021-05-03 NOTE — Telephone Encounter (Signed)
Patient's daughter [name] called to request refill of  HYDROcodone-acetaminophen (NORCO/VICODIN) 5-325 MG tablet [[id number]]   [facility]  [id number] [place], [place] - [address]  [address], [place] [address]  Phone:  [phone]  Fax:  [phone]  DEA #:  [id number]  Please advise at [phone].

## 2021-05-03 NOTE — Telephone Encounter (Signed)
LOV 10/28/20: Therefore I plan to decrease it to 60 tablets/month twice a day in the future as I am concerned about medication adherence and errors.   Last refill 03/30/21, #90, 0 refills  Per chart notes you had planned to reduce rx quantity and frequency. Rx has been changed to reflect this.  Please review, thanks!

## 2021-05-04 ENCOUNTER — Telehealth: Payer: Self-pay | Admitting: Pharmacist

## 2021-05-04 MED ORDER — HYDROCODONE-ACETAMINOPHEN 5-325 MG PO TABS: 1.0000 | ORAL_TABLET | Freq: Two times a day (BID) | ORAL | 0 refills | Status: DC | PRN

## 2021-05-04 NOTE — Progress Notes (Addendum)
° ° °  Chronic Care Management Pharmacy Assistant   Name: Catherine Orozco  MRN: [id number] DOB: 1940-06-21   Reason for Encounter: Monthly Medication Coordination Call    Medications: Outpatient Encounter Medications as of 05/04/2021  Medication Sig   apixaban (ELIQUIS) 5 MG TABS tablet Take 1 tablet (5 mg total) by mouth 2 (two) times daily.   DULoxetine (CYMBALTA) 30 MG capsule TAKE 1 CAPSULE BY MOUTH EVERY DAY   fenofibrate (TRICOR) 145 MG tablet Take 1 tablet (145 mg total) by mouth daily.   gabapentin (NEURONTIN) 600 MG tablet TAKE 1 TABLET BY MOUTH 3  TIMES DAILY   hydrochlorothiazide (HYDRODIURIL) 25 MG tablet Take 1 tablet (25 mg total) by mouth daily.   HYDROcodone-acetaminophen (NORCO/VICODIN) 5-325 MG tablet Take 1 tablet by mouth 2 (two) times daily as needed for moderate pain.   hydrOXYzine (ATARAX/VISTARIL) 25 MG tablet TAKE 1 TABLET BY MOUTH EVERYDAY AT BEDTIME   simvastatin (ZOCOR) 20 MG tablet Take 1 tablet (20 mg total) by mouth at bedtime.   No facility-administered encounter medications on file as of 05/04/2021.   Reviewed chart for medication changes ahead of medication coordination call.  No OVs, Consults, or hospital visits since last care coordination call/Pharmacist visit. (If appropriate, list visit date, provider name)  No medication changes indicated OR if recent visit, treatment plan here.  BP Readings from Last 3 Encounters:  10/28/20 122/70  10/28/20 120/72  01/12/20 102/60    Lab Results  Component Value Date   HGBA1C 5.3 10/28/2020     Patient obtains medications through Adherence Packaging  30 Days   Last adherence delivery included: (medication name and frequency)  Eliquis 5mg  BID Fenofibrate 12mcg HCTZ 25mg  Hydroxyzine 25mg  hs  Patient denied Simvastatin 20mg  daily ([facility])   Patient is due for next adherence delivery on: 05/17/2021. Called patient and reviewed medications and coordinated  delivery.   This delivery to include:  Eliquis 5mg  BID Fenofibrate 150mcg HCTZ 25mg  Hydroxyzine 25mg  hs Simvastatin 20mg  daily   Patient needs refills for None.   Confirmed delivery date of 05/17/2021, advised patient that pharmacy will contact them the morning of delivery.   Care Gaps   AWV: overdue Colonoscopy: done 04/26/14  DM Eye Exam: overdue 05/21/18 DM Foot Exam: never  Microalbumin: never HbgAIC: 10/28/20 (5.3) DEXA: ordered last done 07/09/08  Mammogram: 11/21/05 (?aged out)  Star Rating Drugs: Simvastatin 20 mg - last filled 04/10/2021 30 days    Future Appointments  Date Time Provider [place]  07/06/2021  2:00 PM [facility]-CCM PHARMACIST [facility] None    Jobe Gibbon, CCMA Clinical Pharmacist Assistant  [phone]  10 minutes spent in review, coordination, and documentation.  Reviewed by: Beverly Milch, PharmD Clinical Pharmacist [phone]

## 2021-05-16 ENCOUNTER — Other Ambulatory Visit: Payer: Self-pay | Admitting: Family Medicine

## 2021-06-06 ENCOUNTER — Telehealth: Payer: Self-pay | Admitting: Pharmacist

## 2021-06-06 ENCOUNTER — Other Ambulatory Visit: Payer: Self-pay | Admitting: Family Medicine

## 2021-06-06 MED ORDER — HYDROCODONE-ACETAMINOPHEN 5-325 MG PO TABS: 1.0000 | ORAL_TABLET | Freq: Two times a day (BID) | ORAL | 0 refills | Status: DC | PRN

## 2021-06-06 NOTE — Telephone Encounter (Signed)
Patient requesting refill of   HYDROcodone-acetaminophen (NORCO/VICODIN) 5-325 MG tablet [[id number]]  Pharmacy confirmed as  [facility] - [place], [place] - [address]  [address], [place] [address]  Phone:  [phone]  Fax:  [phone]   Please advise at [phone]

## 2021-06-06 NOTE — Chronic Care Management (AMB) (Addendum)
° ° °  Chronic Care Management Pharmacy Assistant   Name: Catherine Orozco  MRN: [id number] DOB: 04/02/1941   Reason for Encounter: Medication Coordination Call    Recent office visits:  None  Recent consult visits:  None  Hospital visits:  None in previous 6 months  Medications: Outpatient Encounter Medications as of 06/06/2021  Medication Sig   apixaban (ELIQUIS) 5 MG TABS tablet Take 1 tablet (5 mg total) by mouth 2 (two) times daily.   DULoxetine (CYMBALTA) 30 MG capsule TAKE 1 CAPSULE BY MOUTH EVERY DAY   fenofibrate (TRICOR) 145 MG tablet Take 1 tablet (145 mg total) by mouth daily.   gabapentin (NEURONTIN) 600 MG tablet TAKE 1 TABLET BY MOUTH 3  TIMES DAILY   hydrochlorothiazide (HYDRODIURIL) 25 MG tablet Take 1 tablet (25 mg total) by mouth daily.   HYDROcodone-acetaminophen (NORCO/VICODIN) 5-325 MG tablet Take 1 tablet by mouth 2 (two) times daily as needed for moderate pain.   hydrOXYzine (ATARAX/VISTARIL) 25 MG tablet TAKE 1 TABLET BY MOUTH EVERYDAY AT BEDTIME   simvastatin (ZOCOR) 20 MG tablet Take 1 tablet (20 mg total) by mouth at bedtime.   No facility-administered encounter medications on file as of 06/06/2021.   Reviewed chart for medication changes ahead of medication coordination call.  No OVs, Consults, or hospital visits since last care coordination call/Pharmacist visit. (If appropriate, list visit date, provider name)  No medication changes indicated OR if recent visit, treatment plan here.  BP Readings from Last 3 Encounters:  10/28/20 122/70  10/28/20 120/72  01/12/20 102/60    Lab Results  Component Value Date   HGBA1C 5.3 10/28/2020     Patient obtains medications through Vials  30 Days   Last adherence delivery included: Eliquis 5mg  BID Fenofibrate 166mcg HCTZ 25mg  Hydroxyzine 25mg  hs Simvastatin 20mg  daily  Patient is due for next adherence delivery on: 06/15/2021. Called patient and reviewed medications and coordinated  delivery.  This delivery to include: Eliquis 5mg  BID Fenofibrate 186mcg HCTZ 25mg  Hydroxyzine 25mg  hs Simvastatin 20mg  daily  Confirmed delivery date of 06/15/2021, advised patient that pharmacy will contact them the morning of delivery.   Care Gaps: Medicare Annual Wellness: Due now - declines to schedule Hemoglobin A1C: 5.3% on 10/28/2020 Colonoscopy: Last completed 04/26/2014 Dexa Scan: Ordered, last completed 07/09/2008 Mammogram: Last completed 11/21/2005  Future Appointments  Date Time Provider [place]  07/06/2021  2:00 PM [facility]-CCM PHARMACIST [facility] None    April D Calhoun, [place] Pharmacist Assistant [phone]   8 minutes spent in review, coordination, and documentation.  Reviewed by: Beverly Milch, PharmD Clinical Pharmacist [phone]

## 2021-06-06 NOTE — Telephone Encounter (Signed)
LOV 10/28/20 Last refill 05/04/21, #60, 0 refills  Please review, thanks!

## 2021-06-22 NOTE — Progress Notes (Unsigned)
Chronic Care Management Pharmacy Note  06/22/2021 Name:  Catherine Orozco MRN:  [id number] DOB:  1940/07/12  Summary: Initial visit with PharmD.  Majority of time spent on clarification of medication regimen.  Lots of missing meds at home based on current list.    Recommendations/Changes made from today's visit: Pill packs/synchronization through [place] with PCP on med regimen moving forward  Plan: FU 3 months   Subjective: Catherine Orozco is an 81 y.o. year old female who is a primary patient of Pickard, Cammie Mcgee, MD.  The CCM team was consulted for assistance with disease management and care coordination needs.    Engaged with patient by telephone for initial visit in response to provider referral for pharmacy case management and/or care coordination services.   Consent to Services:  The patient was given the following information about Chronic Care Management services today, agreed to services, and gave verbal consent: 1. CCM service includes personalized support from designated clinical staff supervised by the primary care provider, including individualized plan of care and coordination with other care providers 2. 24/7 contact phone numbers for assistance for urgent and routine care needs. 3. Service will only be billed when office clinical staff spend 20 minutes or more in a month to coordinate care. 4. Only one practitioner may furnish and bill the service in a calendar month. 5.The patient may stop CCM services at any time (effective at the end of the month) by phone call to the office staff. 6. The patient will be responsible for cost sharing (co-pay) of up to 20% of the service fee (after annual deductible is met). Patient agreed to services and consent obtained.  Patient Care Team: [name], MD as PCP - General (Family Medicine) Edythe Clarity, [facility] [address] as Pharmacist (Pharmacist)  Recent office visits:  None   Recent consult visits:  None    Hospital visits:  None in previous 6 months   Objective:  Lab Results  Component Value Date   CREATININE 1.14 (H) 10/28/2020   BUN 11 10/28/2020   GFRNONAA 46 (L) 10/28/2020   GFRAA 53 (L) 10/28/2020   NA 138 10/28/2020   K 3.6 10/28/2020   CALCIUM 9.6 10/28/2020   CO2 24 10/28/2020   GLUCOSE 134 (H) 10/28/2020    Lab Results  Component Value Date/Time   HGBA1C 5.3 10/28/2020 04:28 PM   HGBA1C 5.7 (H) 01/12/2020 02:26 PM    Last diabetic Eye exam:  Lab Results  Component Value Date/Time   HMDIABEYEEXA No Retinopathy 05/21/2017 12:00 AM    Last diabetic Foot exam: No results found for: HMDIABFOOTEX   Lab Results  Component Value Date   CHOL 187 10/28/2020   HDL 34 (L) 10/28/2020   LDLCALC 108 (H) 10/28/2020   TRIG 341 (H) 10/28/2020   CHOLHDL 5.5 (H) 10/28/2020    Hepatic Function Latest Ref Rng & Units 10/28/2020 01/12/2020 11/07/2018  Total Protein 6.1 - 8.1 g/dL 6.8 6.7 6.8  Albumin 3.6 - 5.1 g/dL - - -  AST 10 - 35 U/L 34 20 21  ALT 6 - 29 U/L '20 8 13  ' Alk Phosphatase 33 - 130 U/L - - -  Total Bilirubin 0.2 - 1.2 mg/dL 0.9 0.6 0.5    Lab Results  Component Value Date/Time   TSH 1.945 07/08/2014 10:28 AM   TSH 2.706 11/09/2013 04:42 PM    CBC Latest Ref Rng & Units 10/28/2020 01/12/2020 11/07/2018  WBC 3.8 - 10.8 Thousand/uL 10.8 12.2(H)  12.3(H)  Hemoglobin 11.7 - 15.5 g/dL 15.5 15.6(H) 16.0(H)  Hematocrit 35.0 - 45.0 % 45.2(H) 44.4 45.6(H)  Platelets 140 - 400 Thousand/uL 305 254 265    No results found for: VD25OH  Clinical ASCVD: Yes  The 10-year ASCVD risk score (Arnett DK, et al., 2019) is: 55.6%   Values used to calculate the score:     Age: 42 years     Sex: Female     Is Non-Hispanic African American: No     Diabetic: Yes     Tobacco smoker: Yes     Systolic Blood Pressure: 638 mmHg     Is BP treated: Yes     HDL Cholesterol: 34 mg/dL     Total Cholesterol: 187 mg/dL    Depression screen [facility] 2/9 10/28/2020 07/02/2017 07/30/2016   Decreased Interest 1 0 0  Down, Depressed, Hopeless 1 0 0  PHQ - 2 Score 2 0 0  Altered sleeping 0 0 0  Tired, decreased energy 3 2 0  Change in appetite 3 1 0  Feeling bad or failure about yourself  1 1 0  Trouble concentrating 0 1 0  Moving slowly or fidgety/restless 0 0 0  Suicidal thoughts 0 0 0  PHQ-9 Score 9 5 0  Difficult doing work/chores Very difficult Somewhat difficult Not difficult at all     Social History   Tobacco Use  Smoking Status Every Day   Packs/day: 1.00   Years: 55.00   Pack years: 55.00   Types: Cigarettes  Smokeless Tobacco Current   BP Readings from Last 3 Encounters:  10/28/20 122/70  10/28/20 120/72  01/12/20 102/60   Pulse Readings from Last 3 Encounters:  10/28/20 80  01/12/20 (!) 102  11/07/18 100   Wt Readings from Last 3 Encounters:  10/28/20 156 lb (70.8 kg)  10/28/20 156 lb (70.8 kg)  01/12/20 172 lb (78 kg)   BMI Readings from Last 3 Encounters:  10/28/20 26.36 kg/m  10/28/20 26.36 kg/m  01/12/20 29.07 kg/m    Assessment/Interventions: Review of patient past medical history, allergies, medications, health status, including review of consultants reports, laboratory and other test data, was performed as part of comprehensive evaluation and provision of chronic care management services.   SDOH:  (Social Determinants of Health) assessments and interventions performed: Yes  Financial Resource Strain: Low Risk    Difficulty of Paying Living Expenses: Not hard at all    SDOH Screenings   Alcohol Screen: Low Risk    Last Alcohol Screening Score (AUDIT): 0  Depression (PHQ2-9): Medium Risk   PHQ-2 Score: 9  Financial Resource Strain: Low Risk    Difficulty of Paying Living Expenses: Not hard at all  Food Insecurity: No Food Insecurity   Worried About Charity fundraiser in the Last Year: Never true   Ran Out of Food in the Last Year: Never true  Housing: Low Risk    Last Housing Risk Score: 0  Physical Activity:  Inactive   Days of Exercise per Week: 0 days   Minutes of Exercise per Session: 0 min  Social Connections: Socially Isolated   Frequency of Communication with Friends and Family: More than three times a week   Frequency of Social Gatherings with Friends and Family: More than three times a week   Attends Religious Services: Never   Marine scientist or Organizations: No   Attends Archivist Meetings: Never   Marital Status: Widowed  Stress: No Stress Concern  Present   Feeling of Stress : Not at all  Tobacco Use: High Risk   Smoking Tobacco Use: Every Day   Smokeless Tobacco Use: Current   Passive Exposure: Not on file  Transportation Needs: No Transportation Needs   Lack of Transportation (Medical): No   Lack of Transportation (Non-Medical): No    CCM Care Plan  Allergies  Allergen Reactions   Paxil [Paroxetine Hcl]     Can't sleep    Medications Reviewed Today     Reviewed by Edythe Clarity, [facility] (Pharmacist) on 03/16/21 at [facility] List Status: <None>   Medication Order Taking? Sig Documenting Provider Last Dose Status Informant  DULoxetine (CYMBALTA) 30 MG capsule [id number] Yes TAKE 1 CAPSULE BY MOUTH EVERY DAY [name], MD Taking Active   ELIQUIS 5 MG TABS tablet [id number] Yes TAKE 1 TABLET BY MOUTH TWICE A DAY [name], MD Taking Active   fenofibrate (TRICOR) 145 MG tablet [id number] No TAKE 1 TABLET BY MOUTH DAILY  Patient not taking: No sig reported   [name], MD Not Taking Active   gabapentin (NEURONTIN) 600 MG tablet [id number] Yes TAKE 1 TABLET BY MOUTH 3  TIMES DAILY [name], MD Taking Active   hydrochlorothiazide (HYDRODIURIL) 25 MG tablet [id number] Yes TAKE 1 TABLET BY MOUTH  DAILY [name], MD Taking Active   HYDROcodone-acetaminophen (NORCO/VICODIN) 5-325 MG tablet [id number] Yes Take 1 tablet by mouth 3 (three) times daily as needed for moderate pain. [name], MD Taking Active    hydrOXYzine (ATARAX/VISTARIL) 25 MG tablet [id number] Yes TAKE 1 TABLET BY MOUTH EVERYDAY AT BEDTIME [name], MD Taking Active   memantine [facility]) 10 MG tablet [id number] No Take 1 tablet (10 mg total) by mouth 2 (two) times daily.  Patient not taking: Reported on 03/16/2021   [name], MD Not Taking Active   metoprolol tartrate (LOPRESSOR) 25 MG tablet [id number] Yes TAKE 1 TABLET BY MOUTH  TWICE DAILY [name], MD Taking Active   simvastatin (ZOCOR) 20 MG tablet [id number] Yes Take 1 tablet (20 mg total) by mouth at bedtime. [name], MD Taking Active             Patient Active Problem List   Diagnosis Date Noted   Controlled type 2 diabetes mellitus without complication ([place]) 05/39/7673   BP (high blood pressure) 10/25/2015   Dysuria 10/25/2015   Urethral caruncle 10/25/2015   Colon polyps    Anxiety state 04/13/2014   Abdominal pain, generalized 04/13/2014   Back ache 04/13/2014   Disorder of peripheral nervous system 04/13/2014   Major depressive disorder with single episode 04/13/2014   S/P laparoscopic cholecystectomy 01/12/2014   Diarrhea 01/01/2014   D (diarrhea) 01/01/2014   Symptomatic cholelithiasis 12/11/2013   Calculus of gallbladder 12/11/2013   Hyperlipidemia 08/07/2013   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      Medication Assistance: None required.  Patient affirms current coverage meets  needs.  Compliance/Adherence/Medication fill history: Care Gaps: Urine microalbumin Foot exam Eye exam  Star-Rating Drugs: Simvastatin 48m 01/20/21 90ds  Patient[facility] pharmacy [facility]  [id number] [place], [place] [address]: [phone]: [address] [address] [place]one: [phone]: [phone] ([facility] ) - [place] [id number]: [phone]: [phone] Uses pill box? No - takes from bottles Pt endorses 75% compliance  We discussed: Benefits of medication synchronization, packaging and delivery as well as enhanced pharmacist oversight with [facility]. Patient decided to: Utilize [facility] for medication synchronization, packaging and delivery Verbal consent obtained for [facility] enhanced pharmacy services (medication synchronization, adherence packaging, delivery coordination). A medication sync plan was created to allow patient to get all medications delivered once every 30 to 90 days per patient preference. Patient understands they have freedom to choose pharmacy and clinical pharmacist will coordinate care between all prescribers and [facility].  Care Plan and Follow Up Patient Decision:  Patient agrees to Care Plan and Follow-up.  Plan: The care management team will reach out to the patient again over the next 90 days.  ChBeverly MilchPharmD Clinical Pharmacist [facility] ([phone]  Current Barriers:  Unable to independently monitor therapeutic efficacy Unable to self administer medications as prescribed Does not adhere to prescribed medication regimen  Pharmacist Clinical Goal(s):  Patient will achieve adherence to  monitoring guidelines and medication adherence to achieve therapeutic efficacy achieve improvement in adherence as evidenced by fill dates achieve ability to self administer medications as prescribed through use of pill packs as evidenced by patient report through collaboration with PharmD and provider.   Interventions: 1:1 collaboration with PiSusy FrizzleMD regarding development and update of comprehensive plan of care as evidenced by provider attestation and co-signature Inter-disciplinary care team collaboration (see longitudinal plan of care) Comprehensive medication review performed; medication list updated in electronic medical record  Hypertension (BP goal <130/80) -Controlled -Current treatment: HCTZ 2587m patient did not have on hand Metoprolol tartrate 94m42mD - did not have on hand -Medications previously tried: none noted  -Current home readings: checks sometimes no logs present -Current exercise habits: minimal -Denies hypotensive/hypertensive symptoms -Educated on BP goals and benefits of medications for prevention of heart attack, stroke and kidney damage; Importance of home blood pressure monitoring; Symptoms of hypotension and importance of maintaining adequate hydration; -Counseled to monitor BP at home periodically, document, and provide log at future appointments -Recommended to continue current medication Need to confirm with PCP but I believe she should be taking both of this.  Especially metoprolol in the setting of Afib.  Hyperlipidemia: (LDL goal < 70) -Uncontrolled -Current treatment: Simvastatin 20mg34mly Fenofibrate 145mcg34mly - none on hand (not taking currently) -Medications previously tried: none noted   -Educated on Cholesterol goals;  Benefits of statin for ASCVD risk reduction; Importance of limiting foods high in cholesterol; -Recommended to continue current medication I believe she needs to be on fenofibrate due to TG > 300 in June  lipid panel, will confirm with PCP.  Diabetes w/ neuropathy (A1c goal <7%) -Controlled -Current medications: Gabapentin 600mg t8mrn Cymbalta 30mg da28m- not taking 0 on hand -Medications previously tried:  none noted  -Current home glucose readings fasting glucose: not checking post prandial glucose: not checking  -Denies hypoglycemic/hyperglycemic symptoms -Educated on A1c and blood sugar goals; -Counseled to check feet daily and get yearly eye exams -Due for eye exam/foot exam/and urine microalbumin -Not taking Cymbalta and using gabapentin sparingly - she has plenty of this left. -Recommended to continue current medication Update recommended screenings  Atrial Fibrillation (Goal: prevent stroke and major bleeding) -Controlled -Current treatment: Rate control: metoprolol tartrate 40m BID - not taking Anticoagulation: Eliquis 587mBID -Medications previously tried: none noted  -Counseled on increased risk of stroke due to Afib and benefits of anticoagulation for stroke prevention; importance of adherence to anticoagulant exactly as prescribed; bleeding risk associated with Eliquis and importance of self-monitoring for signs/symptoms of bleeding; -Denies any abnormal bruising/bleeding - copay is increased as they are in donut hole but tolerable -Recommended to continue current medication  Health Maintenance There are a lot of gaps in fill history and uncertainty at home on which medications she is taking.  I feel it is most beneficial to put her in pill packs so that I can facilitate the prescriptions being filled and make sure that she is taking the right medication regimen.  Sister has agreed to trial this process and we will facilitate all refills moving forward this way.  Patient Goals/Self-Care Activities Patient will:  - take medications as prescribed as evidenced by patient report and record review focus on medication adherence by pill packs check blood pressure  periodically, document, and provide at future appointments  Follow Up Plan: The care management team will reach out to the patient again over the next 180 days.

## 2021-07-05 ENCOUNTER — Telehealth: Payer: Self-pay | Admitting: Pharmacist

## 2021-07-05 NOTE — Progress Notes (Addendum)
° ° °  Chronic Care Management Pharmacy Assistant   Name: Catherine Orozco  MRN: [id number] DOB: 08/21/1940   Reason for Encounter: Medication Coordination Call    Recent office visits:  None  Recent consult visits:  None  Hospital visits:  None in previous 6 months  Medications: Outpatient Encounter Medications as of 07/05/2021  Medication Sig   apixaban (ELIQUIS) 5 MG TABS tablet Take 1 tablet (5 mg total) by mouth 2 (two) times daily.   DULoxetine (CYMBALTA) 30 MG capsule TAKE 1 CAPSULE BY MOUTH EVERY DAY   fenofibrate (TRICOR) 145 MG tablet Take 1 tablet (145 mg total) by mouth daily.   gabapentin (NEURONTIN) 600 MG tablet TAKE 1 TABLET BY MOUTH 3  TIMES DAILY   hydrochlorothiazide (HYDRODIURIL) 25 MG tablet Take 1 tablet (25 mg total) by mouth daily.   HYDROcodone-acetaminophen (NORCO/VICODIN) 5-325 MG tablet Take 1 tablet by mouth 2 (two) times daily as needed for moderate pain.   hydrOXYzine (ATARAX/VISTARIL) 25 MG tablet TAKE 1 TABLET BY MOUTH EVERYDAY AT BEDTIME   simvastatin (ZOCOR) 20 MG tablet Take 1 tablet (20 mg total) by mouth at bedtime.   No facility-administered encounter medications on file as of 07/05/2021.    BP Readings from Last 3 Encounters:  10/28/20 122/70  10/28/20 120/72  01/12/20 102/60    Lab Results  Component Value Date   HGBA1C 5.3 10/28/2020     Patient obtains medications through Vials  30 Days   Last adherence delivery included:  Eliquis 5mg  BID Fenofibrate 17mcg HCTZ 25mg  Hydroxyzine 25mg  hs Simvastatin 20mg  daily  Patient is due for next adherence delivery on: 07/17/2021. Called patient and reviewed medications and coordinated delivery.  This delivery to include: Eliquis 5mg  BID Fenofibrate 115mcg HCTZ 25mg  Hydroxyzine 25mg  hs Simvastatin 20mg  daily   Confirmed delivery date of 07/17/2021, advised patient that pharmacy will contact them the morning of delivery.   Care Gaps: Medicare Annual Wellness: Overdue Hemoglobin  A1C: 5.3% on 10/28/2020 Colonoscopy: Last completed 04/26/2014 Dexa Scan: Ordered on 10/28/2020 Mammogram: Last completed 11/21/2005  Future Appointments  Date Time Provider [place]  07/06/2021  2:00 PM BSFM-CCM PHARMACIST [facility] None     Star Rating Drugs: Simvastatin 20 mg last filled 05/03/2021 90 DS  April D Calhoun, [place] Pharmacist Assistant [phone]   8 minutes spent in review, coordination, and documentation.  Reviewed by: Beverly Milch, PharmD Clinical Pharmacist [phone]

## 2021-07-06 ENCOUNTER — Telehealth: Payer: Medicare Other

## 2021-07-10 ENCOUNTER — Other Ambulatory Visit: Payer: Self-pay

## 2021-07-10 NOTE — Telephone Encounter (Signed)
LOV 10/28/20 Last refill 06/06/21, #60, 0 refills  Please review, thanks!

## 2021-07-10 NOTE — Telephone Encounter (Signed)
Pt's daughter called in requesting a refill of HYDROcodone-acetaminophen (NORCO/VICODIN) 5-325 MG tablet for pt. Please advise  Cb#: [phone]

## 2021-07-11 MED ORDER — HYDROCODONE-ACETAMINOPHEN 5-325 MG PO TABS: 1.0000 | ORAL_TABLET | Freq: Two times a day (BID) | ORAL | 0 refills | Status: DC | PRN

## 2021-07-13 ENCOUNTER — Telehealth: Payer: Medicare Other

## 2021-07-27 ENCOUNTER — Other Ambulatory Visit: Payer: Self-pay | Admitting: Family Medicine

## 2021-07-30 ENCOUNTER — Other Ambulatory Visit: Payer: Self-pay

## 2021-07-30 ENCOUNTER — Inpatient Hospital Stay
Admission: EM | Admit: 2021-07-30 | Discharge: 2021-08-02 | DRG: 309 | Disposition: A | Discharge status: Hospice | Payer: Medicare Other | Attending: Internal Medicine | Admitting: Internal Medicine

## 2021-07-30 ENCOUNTER — Emergency Department: Payer: Medicare Other

## 2021-07-30 DIAGNOSIS — I700 Atherosclerosis of aorta: Secondary | ICD-10-CM | POA: Diagnosis not present

## 2021-07-30 DIAGNOSIS — R590 Localized enlarged lymph nodes: Secondary | ICD-10-CM | POA: Diagnosis present

## 2021-07-30 DIAGNOSIS — G8929 Other chronic pain: Secondary | ICD-10-CM | POA: Diagnosis present

## 2021-07-30 DIAGNOSIS — Z7189 Other specified counseling: Secondary | ICD-10-CM | POA: Diagnosis not present

## 2021-07-30 DIAGNOSIS — E872 Acidosis, unspecified: Secondary | ICD-10-CM | POA: Diagnosis not present

## 2021-07-30 DIAGNOSIS — A419 Sepsis, unspecified organism: Secondary | ICD-10-CM

## 2021-07-30 DIAGNOSIS — I4891 Unspecified atrial fibrillation: Secondary | ICD-10-CM | POA: Diagnosis present

## 2021-07-30 DIAGNOSIS — Z9114 Patient's other noncompliance with medication regimen: Secondary | ICD-10-CM

## 2021-07-30 DIAGNOSIS — N1831 Chronic kidney disease, stage 3a: Secondary | ICD-10-CM | POA: Diagnosis not present

## 2021-07-30 DIAGNOSIS — I82403 Acute embolism and thrombosis of unspecified deep veins of lower extremity, bilateral: Secondary | ICD-10-CM | POA: Diagnosis not present

## 2021-07-30 DIAGNOSIS — C3431 Malignant neoplasm of lower lobe, right bronchus or lung: Secondary | ICD-10-CM | POA: Diagnosis not present

## 2021-07-30 DIAGNOSIS — R000 Tachycardia, unspecified: Secondary | ICD-10-CM | POA: Diagnosis not present

## 2021-07-30 DIAGNOSIS — I739 Peripheral vascular disease, unspecified: Secondary | ICD-10-CM | POA: Diagnosis not present

## 2021-07-30 DIAGNOSIS — E1122 Type 2 diabetes mellitus with diabetic chronic kidney disease: Secondary | ICD-10-CM | POA: Diagnosis present

## 2021-07-30 DIAGNOSIS — B9620 Unspecified Escherichia coli [E. coli] as the cause of diseases classified elsewhere: Secondary | ICD-10-CM | POA: Diagnosis present

## 2021-07-30 DIAGNOSIS — R680 Hypothermia, not associated with low environmental temperature: Secondary | ICD-10-CM | POA: Diagnosis not present

## 2021-07-30 DIAGNOSIS — I10 Essential (primary) hypertension: Secondary | ICD-10-CM | POA: Diagnosis present

## 2021-07-30 DIAGNOSIS — Z66 Do not resuscitate: Secondary | ICD-10-CM | POA: Diagnosis not present

## 2021-07-30 DIAGNOSIS — E119 Type 2 diabetes mellitus without complications: Secondary | ICD-10-CM

## 2021-07-30 DIAGNOSIS — Z888 Allergy status to other drugs, medicaments and biological substances status: Secondary | ICD-10-CM

## 2021-07-30 DIAGNOSIS — Z20822 Contact with and (suspected) exposure to covid-19: Secondary | ICD-10-CM | POA: Diagnosis present

## 2021-07-30 DIAGNOSIS — E440 Moderate protein-calorie malnutrition: Secondary | ICD-10-CM | POA: Diagnosis present

## 2021-07-30 DIAGNOSIS — R918 Other nonspecific abnormal finding of lung field: Secondary | ICD-10-CM | POA: Diagnosis not present

## 2021-07-30 DIAGNOSIS — N179 Acute kidney failure, unspecified: Secondary | ICD-10-CM | POA: Diagnosis not present

## 2021-07-30 DIAGNOSIS — Z515 Encounter for palliative care: Secondary | ICD-10-CM | POA: Diagnosis not present

## 2021-07-30 DIAGNOSIS — N132 Hydronephrosis with renal and ureteral calculous obstruction: Secondary | ICD-10-CM | POA: Diagnosis present

## 2021-07-30 DIAGNOSIS — I499 Cardiac arrhythmia, unspecified: Secondary | ICD-10-CM | POA: Diagnosis not present

## 2021-07-30 DIAGNOSIS — E785 Hyperlipidemia, unspecified: Secondary | ICD-10-CM | POA: Diagnosis present

## 2021-07-30 DIAGNOSIS — I70203 Unspecified atherosclerosis of native arteries of extremities, bilateral legs: Secondary | ICD-10-CM | POA: Diagnosis not present

## 2021-07-30 DIAGNOSIS — I7409 Other arterial embolism and thrombosis of abdominal aorta: Secondary | ICD-10-CM | POA: Diagnosis not present

## 2021-07-30 DIAGNOSIS — G309 Alzheimer's disease, unspecified: Secondary | ICD-10-CM | POA: Diagnosis not present

## 2021-07-30 DIAGNOSIS — I480 Paroxysmal atrial fibrillation: Principal | ICD-10-CM | POA: Diagnosis present

## 2021-07-30 DIAGNOSIS — E860 Dehydration: Secondary | ICD-10-CM | POA: Diagnosis present

## 2021-07-30 DIAGNOSIS — E1142 Type 2 diabetes mellitus with diabetic polyneuropathy: Secondary | ICD-10-CM | POA: Diagnosis not present

## 2021-07-30 DIAGNOSIS — N390 Urinary tract infection, site not specified: Secondary | ICD-10-CM | POA: Diagnosis not present

## 2021-07-30 DIAGNOSIS — R579 Shock, unspecified: Secondary | ICD-10-CM

## 2021-07-30 DIAGNOSIS — R627 Adult failure to thrive: Secondary | ICD-10-CM | POA: Diagnosis present

## 2021-07-30 DIAGNOSIS — I743 Embolism and thrombosis of arteries of the lower extremities: Secondary | ICD-10-CM | POA: Diagnosis not present

## 2021-07-30 DIAGNOSIS — I248 Other forms of acute ischemic heart disease: Secondary | ICD-10-CM | POA: Diagnosis present

## 2021-07-30 DIAGNOSIS — F02A0 Dementia in other diseases classified elsewhere, mild, without behavioral disturbance, psychotic disturbance, mood disturbance, and anxiety: Secondary | ICD-10-CM | POA: Diagnosis present

## 2021-07-30 DIAGNOSIS — E1151 Type 2 diabetes mellitus with diabetic peripheral angiopathy without gangrene: Secondary | ICD-10-CM | POA: Diagnosis present

## 2021-07-30 DIAGNOSIS — R531 Weakness: Secondary | ICD-10-CM | POA: Diagnosis not present

## 2021-07-30 DIAGNOSIS — R400 Somnolence: Secondary | ICD-10-CM | POA: Diagnosis not present

## 2021-07-30 DIAGNOSIS — J90 Pleural effusion, not elsewhere classified: Secondary | ICD-10-CM | POA: Diagnosis not present

## 2021-07-30 DIAGNOSIS — R52 Pain, unspecified: Secondary | ICD-10-CM | POA: Diagnosis not present

## 2021-07-30 DIAGNOSIS — R0600 Dyspnea, unspecified: Secondary | ICD-10-CM | POA: Diagnosis not present

## 2021-07-30 DIAGNOSIS — E861 Hypovolemia: Secondary | ICD-10-CM | POA: Diagnosis present

## 2021-07-30 DIAGNOSIS — Z6824 Body mass index (BMI) 24.0-24.9, adult: Secondary | ICD-10-CM

## 2021-07-30 DIAGNOSIS — I129 Hypertensive chronic kidney disease with stage 1 through stage 4 chronic kidney disease, or unspecified chronic kidney disease: Secondary | ICD-10-CM | POA: Diagnosis present

## 2021-07-30 DIAGNOSIS — F17210 Nicotine dependence, cigarettes, uncomplicated: Secondary | ICD-10-CM | POA: Diagnosis present

## 2021-07-30 DIAGNOSIS — E876 Hypokalemia: Secondary | ICD-10-CM | POA: Diagnosis present

## 2021-07-30 DIAGNOSIS — I513 Intracardiac thrombosis, not elsewhere classified: Secondary | ICD-10-CM | POA: Diagnosis not present

## 2021-07-30 DIAGNOSIS — M549 Dorsalgia, unspecified: Secondary | ICD-10-CM | POA: Diagnosis present

## 2021-07-30 DIAGNOSIS — I82462 Acute embolism and thrombosis of left calf muscular vein: Secondary | ICD-10-CM | POA: Diagnosis not present

## 2021-07-30 DIAGNOSIS — R54 Age-related physical debility: Secondary | ICD-10-CM | POA: Diagnosis not present

## 2021-07-30 DIAGNOSIS — Z743 Need for continuous supervision: Secondary | ICD-10-CM | POA: Diagnosis not present

## 2021-07-30 LAB — CBC WITH DIFFERENTIAL/PLATELET
Abs Immature Granulocytes: 0.15 10*3/uL — ABNORMAL HIGH (ref 0.00–0.07)
Basophils Absolute: 0 10*3/uL (ref 0.0–0.1)
Basophils Relative: 0 %
Eosinophils Absolute: 0 10*3/uL (ref 0.0–0.5)
Eosinophils Relative: 0 %
HCT: 42.4 % (ref 36.0–46.0)
Hemoglobin: 13.7 g/dL (ref 12.0–15.0)
Immature Granulocytes: 1 %
Lymphocytes Relative: 11 %
Lymphs Abs: 1.4 10*3/uL (ref 0.7–4.0)
MCH: 28.8 pg (ref 26.0–34.0)
MCHC: 32.3 g/dL (ref 30.0–36.0)
MCV: 89.1 fL (ref 80.0–100.0)
Monocytes Absolute: 0.6 10*3/uL (ref 0.1–1.0)
Monocytes Relative: 5 %
Neutro Abs: 10.7 10*3/uL — ABNORMAL HIGH (ref 1.7–7.7)
Neutrophils Relative %: 83 %
Platelets: 300 10*3/uL (ref 150–400)
RBC: 4.76 MIL/uL (ref 3.87–5.11)
RDW: 13.8 % (ref 11.5–15.5)
WBC: 12.9 10*3/uL — ABNORMAL HIGH (ref 4.0–10.5)
nRBC: 0 % (ref 0.0–0.2)

## 2021-07-30 LAB — COMPREHENSIVE METABOLIC PANEL
ALT: 15 U/L (ref 0–44)
AST: 45 U/L — ABNORMAL HIGH (ref 15–41)
Albumin: 2.5 g/dL — ABNORMAL LOW (ref 3.5–5.0)
Alkaline Phosphatase: 128 U/L — ABNORMAL HIGH (ref 38–126)
Anion gap: 13 (ref 5–15)
BUN: 34 mg/dL — ABNORMAL HIGH (ref 8–23)
CO2: 21 mmol/L — ABNORMAL LOW (ref 22–32)
Calcium: 8.1 mg/dL — ABNORMAL LOW (ref 8.9–10.3)
Chloride: 109 mmol/L (ref 98–111)
Creatinine, Ser: 1.21 mg/dL — ABNORMAL HIGH (ref 0.44–1.00)
GFR, Estimated: 46 mL/min — ABNORMAL LOW (ref >60)
Glucose, Bld: 108 mg/dL — ABNORMAL HIGH (ref 70–99)
Potassium: 3.2 mmol/L — ABNORMAL LOW (ref 3.5–5.1)
Sodium: 143 mmol/L (ref 135–145)
Total Bilirubin: 0.9 mg/dL (ref 0.3–1.2)
Total Protein: 6.3 g/dL — ABNORMAL LOW (ref 6.5–8.1)

## 2021-07-30 LAB — RESP PANEL BY RT-PCR (FLU A&B, COVID) ARPGX2
Influenza A by PCR: NEGATIVE
Influenza B by PCR: NEGATIVE
SARS Coronavirus 2 by RT PCR: NEGATIVE

## 2021-07-30 LAB — TROPONIN I (HIGH SENSITIVITY)
Troponin I (High Sensitivity): 14 ng/L (ref <18)
Troponin I (High Sensitivity): 16 ng/L (ref <18)

## 2021-07-30 LAB — HEPARIN LEVEL (UNFRACTIONATED): Heparin Unfractionated: 0.28 IU/mL — ABNORMAL LOW (ref 0.30–0.70)

## 2021-07-30 LAB — MAGNESIUM: Magnesium: 2.3 mg/dL (ref 1.7–2.4)

## 2021-07-30 LAB — PROCALCITONIN: Procalcitonin: 0.75 ng/mL

## 2021-07-30 LAB — PROTIME-INR
INR: 1.2 (ref 0.8–1.2)
Prothrombin Time: 15.5 seconds — ABNORMAL HIGH (ref 11.4–15.2)

## 2021-07-30 LAB — MRSA NEXT GEN BY PCR, NASAL: MRSA by PCR Next Gen: NOT DETECTED

## 2021-07-30 LAB — LACTIC ACID, PLASMA
Lactic Acid, Venous: 3.6 mmol/L (ref 0.5–1.9)
Lactic Acid, Venous: 1.5 mmol/L (ref 0.5–1.9)

## 2021-07-30 LAB — GLUCOSE, CAPILLARY
Glucose-Capillary: 96 mg/dL (ref 70–99)
Glucose-Capillary: 88 mg/dL (ref 70–99)

## 2021-07-30 LAB — BRAIN NATRIURETIC PEPTIDE: B Natriuretic Peptide: 108.5 pg/mL — ABNORMAL HIGH (ref 0.0–100.0)

## 2021-07-30 LAB — APTT: aPTT: 74 seconds — ABNORMAL HIGH (ref 24–36)

## 2021-07-30 MED ORDER — ACETAMINOPHEN 325 MG PO TABS
650.0000 mg | ORAL_TABLET | ORAL | Status: DC | PRN
  Administered 2021-07-31 – 2021-08-01 (×2): 650 mg via ORAL
  Filled 2021-07-30 (×2): qty 2

## 2021-07-30 MED ORDER — ONDANSETRON HCL 4 MG/2ML IJ SOLN: 4.0000 mg | Freq: Four times a day (QID) | INTRAMUSCULAR | Status: DC | PRN

## 2021-07-30 MED ORDER — IOHEXOL 350 MG/ML SOLN: 100.0000 mL | Freq: Once | INTRAVENOUS | Status: DC | PRN

## 2021-07-30 MED ORDER — METOPROLOL TARTRATE 25 MG PO TABS
12.5000 mg | ORAL_TABLET | Freq: Two times a day (BID) | ORAL | Status: DC
  Administered 2021-07-31 (×2): 12.5 mg via ORAL
  Filled 2021-07-30 (×2): qty 1

## 2021-07-30 MED ORDER — KCL-LACTATED RINGERS-D5W 20 MEQ/L IV SOLN
INTRAVENOUS | Status: DC
  Filled 2021-07-30 (×6): qty 1000

## 2021-07-30 MED ORDER — SODIUM CHLORIDE 0.9 % IV BOLUS
1000.0000 mL | Freq: Once | INTRAVENOUS | Status: AC
  Administered 2021-07-30: 1000 mL via INTRAVENOUS

## 2021-07-30 MED ORDER — DILTIAZEM HCL-DEXTROSE 125-5 MG/125ML-% IV SOLN (PREMIX)
5.0000 mg/h | INTRAVENOUS | Status: DC
  Administered 2021-07-30 – 2021-07-31 (×2): 5 mg/h via INTRAVENOUS
  Filled 2021-07-30 (×2): qty 125

## 2021-07-30 MED ORDER — VANCOMYCIN HCL IN DEXTROSE 1-5 GM/200ML-% IV SOLN: 1000.0000 mg | Freq: Once | INTRAVENOUS | Status: DC

## 2021-07-30 MED ORDER — IOHEXOL 350 MG/ML SOLN
100.0000 mL | Freq: Once | INTRAVENOUS | Status: AC | PRN
  Administered 2021-07-30: 100 mL via INTRAVENOUS

## 2021-07-30 MED ORDER — DOCUSATE SODIUM 100 MG PO CAPS: 100.0000 mg | ORAL_CAPSULE | Freq: Two times a day (BID) | ORAL | Status: DC | PRN

## 2021-07-30 MED ORDER — LACTATED RINGERS IV SOLN: INTRAVENOUS | Status: DC

## 2021-07-30 MED ORDER — HEPARIN BOLUS VIA INFUSION
3300.0000 [IU] | Freq: Once | INTRAVENOUS | Status: AC
  Administered 2021-07-30: 3300 [IU] via INTRAVENOUS
  Filled 2021-07-30: qty 3300

## 2021-07-30 MED ORDER — POLYETHYLENE GLYCOL 3350 17 G PO PACK: 17.0000 g | PACK | Freq: Every day | ORAL | Status: DC | PRN

## 2021-07-30 MED ORDER — METOPROLOL TARTRATE 25 MG PO TABS
25.0000 mg | ORAL_TABLET | Freq: Once | ORAL | Status: AC
  Administered 2021-07-30: 25 mg via ORAL
  Filled 2021-07-30: qty 1

## 2021-07-30 MED ORDER — HEPARIN (PORCINE) 25000 UT/250ML-% IV SOLN
1000.0000 [IU]/h | INTRAVENOUS | Status: DC
  Administered 2021-07-30: 1000 [IU]/h via INTRAVENOUS
  Filled 2021-07-30: qty 250

## 2021-07-30 MED ORDER — SODIUM CHLORIDE 0.9 % IV SOLN
2.0000 g | Freq: Once | INTRAVENOUS | Status: AC
  Administered 2021-07-30: 2 g via INTRAVENOUS
  Filled 2021-07-30: qty 2

## 2021-07-30 NOTE — Consult Note (Signed)
ANTICOAGULATION CONSULT NOTE - Follow Up Consult ? ?Pharmacy Consult for heparin gtt ?Indication: C/f Arterial embolus & limb ischemia ISO Afib (mixed med compliance; PTA eliquis) ? ?Allergies  ?Allergen Reactions  ? Paxil [Paroxetine Hcl]   ?  Can't sleep  ? ? ?Patient Measurements: ?Height: 5\' 2"  (157.5 cm) ?Weight: 68 kg (150 lb) ?IBW/kg (Calculated) : 50.1 ?Heparin Dosing Weight: 64.2kg ? ?Vital Signs: ?Temp: 93.4 ?F (34.1 ?C) (03/19 1600) ?Temp Source: Rectal (03/19 1600) ?BP: 128/96 (03/19 1600) ?Pulse Rate: 161 (03/19 1600) ? ?Labs: ?Recent Labs  ?  07/30/21 ?1616  ?HGB 13.7  ?HCT 42.4  ?PLT 300  ?CREATININE 1.21*  ?TROPONINIHS 14  ? ? ?Estimated Creatinine Clearance: 34.1 mL/min (A) (by C-G formula based on SCr of 1.21 mg/dL (H)). ? ? ?Medications:  ?PTA: Eliquis 5mg  BID ?Inpatient: + Heparin gtt (3/19>> ?Heparin Dosing Weight: 64.2kg ? ?Assessment: ?81yo F w/ h/o Afib (on eliquis), DM, HTN, & dementia presenting with weakness and discoloration of legs. Based on presentation c/f Arterial embolus & limb ischemia ISO Afib (mixed med compliance; PTA eliquis). Planning CT Angio to assess and consult to Vasc Surgery to evaluate. Pharmacy consulted for management of heparin gtt. ? ?Date Time aPTT/HL Rate/Comment ?     ? ?Baseline Labs: ?aPTT - pending ?INR - pending ?Hgb - 13.7 ?Plts - 300 ? ?Goal of Therapy:  ?Heparin level 0.3-0.7 units/ml ?aPTT 66-102 seconds ?Monitor platelets by anticoagulation protocol: Yes ?  ?Plan:  ?Will start with slightly reduced bolus based on unknown timing of last eliquis dose, but d/t c/f new clot will not delay starting infusion. ?Give 3300 units bolus x1; then start heparin infusion at 1000 units/hr ?Check aPTT/Anti-Xa level in 8hours and daily once consecutively therapeutic.  ?Titrate by aPTT's until lab correlation is noted, then titrate by anti-xa alone. ?Continue to monitor H&H and platelets daily while on heparin gtt. ? ?Lorna Dibble, PharmD, BCCP ?Clinical  Pharmacist ?07/30/2021 5:07 PM ? ? ? ?

## 2021-07-30 NOTE — Plan of Care (Addendum)
?  Interdisciplinary Goals of Care Family Meeting ? ? ?Date carried out: 07/30/2021 ? ?Location of the meeting: Bedside ? ?Member's involved: Physician, Bedside Registered Nurse, and Family Member or next of kin ? ?Durable Power of Attorney or acting medical decision maker: Catherine Orozco, daughter, present during discussion. ? ?Discussion: We discussed goals of care for Catherine Orozco .  Patient has had failure to thrive which has accelerated particularly over the last few weeks.  Patient is also with significant malnutrition.  Daughter is interested in pursuing palliative care for the patient.  Current situation with atrial fibrillation and rapid ventricular response, potential sepsis and volume depletion.  Patient's daughter states to treat the treatable with the least invasive means possible but no mechanical ventilation, CPR or ACLS.  Patient is DNR. ? ?Code status: Full DNR ? ?Disposition: Continue current acute care ,Palliative Care consultation. ? ?Time spent for the meeting: 15 minutes ? ? ? ?C. Derrill Kay, MD ?Advanced Bronchoscopy ?[facility] ? ? ?07/30/2021, 10:15 PM ? ? ?

## 2021-07-30 NOTE — ED Provider Notes (Signed)
? ?[facility] ?Provider Note ? ? ? Event Date/Time  ? First MD Initiated Contact with Patient 07/30/21 1557   ?  (approximate) ? ? ?History  ? ?Weakness ? ? ?HPI ? ?Catherine Orozco is a 81 y.o. female with a PMH of atrial fibrillation on Eliquis, DM, hypertension, and dementia who presents with weakness and discoloration of her legs.  Per EMS the discoloration was noted by family today.  The patient has been weak and has not been eating or drinking in the last several days.  The patient's main complaint is that she is very cold and thirsty.  She denies acute pain in her legs or any numbness or tingling. ? ? ?Physical Exam  ? ?Triage Vital Signs: ?ED Triage Vitals  ?Enc Vitals Group  ?   BP 07/30/21 1600 (!) 128/96  ?   Pulse Rate 07/30/21 1600 (!) 161  ?   Resp 07/30/21 1600 19  ?   Temp 07/30/21 1600 (!) 93.4 ?F (34.1 ?C)  ?   Temp Source 07/30/21 1600 Rectal  ?   SpO2 07/30/21 1600 94 %  ?   Weight 07/30/21 1606 150 lb (68 kg)  ?   Height 07/30/21 1606 5\' 2"  (1.575 m)  ?   Head Circumference --   ?   Peak Flow --   ?   Pain Score --   ?   Pain Loc --   ?   Pain Edu? --   ?   Excl. in [place]? --   ? ? ?Most recent vital signs: ?Vitals:  ? 07/30/21 2230 07/30/21 2245  ?BP: (!) 105/59 106/67  ?Pulse: 73 (!) 52  ?Resp: 15 (!) 21  ?Temp:    ?SpO2: 100% 100%  ? ? ? ?General: Alert, oriented x2, weak and frail appearing. ?CV:  Tachycardic, irregular rhythm.  Normal heart sounds. ?Resp:  Normal effort.  Lungs CTA B. ?Abd:  No distention.  ?Other:  All extremities somewhat cool and pale.  Right toes mildly cyanotic.  No palpable DP pulses, however there is audible arterial pulse via Doppler in the left.  No Doppler pulse on the right.  Normal range of motion to the foot and ankle, intact sensation.  Dry mucous membranes.  Motor intact in all extremities. ? ? ?ED Results / Procedures / Treatments  ? ?Labs ?(all labs ordered are listed, but only abnormal results are displayed) ?Labs Reviewed   ?COMPREHENSIVE METABOLIC PANEL - Abnormal; Notable for the following components:  ?    Result Value  ? Potassium 3.2 (*)   ? CO2 21 (*)   ? Glucose, Bld 108 (*)   ? BUN 34 (*)   ? Creatinine, Ser 1.21 (*)   ? Calcium 8.1 (*)   ? Total Protein 6.3 (*)   ? Albumin 2.5 (*)   ? AST 45 (*)   ? Alkaline Phosphatase 128 (*)   ? GFR, Estimated 46 (*)   ? All other components within normal limits  ?CBC WITH DIFFERENTIAL/PLATELET - Abnormal; Notable for the following components:  ? WBC 12.9 (*)   ? Neutro Abs 10.7 (*)   ? Abs Immature Granulocytes 0.15 (*)   ? All other components within normal limits  ?LACTIC ACID, PLASMA - Abnormal; Notable for the following components:  ? Lactic Acid, Venous 3.6 (*)   ? All other components within normal limits  ?BRAIN NATRIURETIC PEPTIDE - Abnormal; Notable for the following components:  ? B Natriuretic Peptide 108.5 (*)   ?  All other components within normal limits  ?APTT - Abnormal; Notable for the following components:  ? aPTT 74 (*)   ? All other components within normal limits  ?HEPARIN LEVEL (UNFRACTIONATED) - Abnormal; Notable for the following components:  ? Heparin Unfractionated 0.28 (*)   ? All other components within normal limits  ?PROTIME-INR - Abnormal; Notable for the following components:  ? Prothrombin Time 15.5 (*)   ? All other components within normal limits  ?RESP PANEL BY RT-PCR (FLU A&B, COVID) [id number]  ?CULTURE, BLOOD (ROUTINE X 2)  ?CULTURE, BLOOD (ROUTINE X 2)  ?URINE CULTURE  ?MRSA NEXT GEN BY PCR, NASAL  ?LACTIC ACID, PLASMA  ?MAGNESIUM  ?GLUCOSE, CAPILLARY  ?URINALYSIS, ROUTINE W REFLEX MICROSCOPIC  ?CBC  ?HEPARIN LEVEL (UNFRACTIONATED)  ?APTT  ?CORTISOL  ?STREP PNEUMONIAE URINARY ANTIGEN  ?LEGIONELLA PNEUMOPHILA SEROGP 1 UR AG  ?BASIC METABOLIC PANEL  ?MAGNESIUM  ?PHOSPHORUS  ?TSH  ?T4, FREE  ?PROCALCITONIN  ?PROCALCITONIN  ?TROPONIN I (HIGH SENSITIVITY)  ?TROPONIN I (HIGH SENSITIVITY)  ? ? ? ?EKG ? ?ED ECG REPORT ?IArta Silence, the attending  physician, personally viewed and interpreted this ECG. ? ?Date: 07/30/2021 ?EKG Time: 1555 ?Rate: 165 ?Rhythm: Atrial fibrillation with RVR ?QRS Axis: Borderline right axis ?Intervals: normal ?ST/T Wave abnormalities: normal ?Narrative Interpretation: no evidence of acute ischemia ? ? ? ?RADIOLOGY ? ?Chest x-ray: I independently viewed and interpreted the images; there is no focal consolidation or edema ? ?PROCEDURES: ? ?Critical Care performed: Yes, see critical care procedure note(s) ? ?.Critical Care ?Performed by: Arta Silence, MD ?Authorized by: Arta Silence, MD  ? ?Critical care provider statement:  ?  Critical care time (minutes):  60 ?  Critical care was necessary to treat or prevent imminent or life-threatening deterioration of the following conditions:  Circulatory failure and cardiac failure ?  Critical care was time spent personally by me on the following activities:  Development of treatment plan with patient or surrogate, discussions with consultants, evaluation of patient's response to treatment, examination of patient, ordering and review of laboratory studies, ordering and review of radiographic studies, ordering and performing treatments and interventions, pulse oximetry, re-evaluation of patient's condition, review of old charts and obtaining history from patient or surrogate ?  Care discussed with: admitting provider   ? ? ?MEDICATIONS ORDERED IN ED: ?Medications  ?diltiazem (CARDIZEM) 125 mg in dextrose 5% 125 mL (1 mg/mL) infusion (10 mg/hr Intravenous Infusion Verify 07/30/21 2300)  ?iohexol (OMNIPAQUE) 350 MG/ML injection 100 mL (has no administration in time range)  ?heparin ADULT infusion 100 units/mL (25000 units/230mL) (1,000 Units/hr Intravenous Infusion Verify 07/30/21 2300)  ?docusate sodium (COLACE) capsule 100 mg (has no administration in time range)  ?polyethylene glycol (MIRALAX / GLYCOLAX) packet 17 g (has no administration in time range)  ?dextrose 5% in lactated  ringers with KCl 20 mEq/L infusion ( Intravenous New Bag/Given 07/30/21 2317)  ?acetaminophen (TYLENOL) tablet 650 mg (has no administration in time range)  ?ondansetron (ZOFRAN) injection 4 mg (has no administration in time range)  ?metoprolol tartrate (LOPRESSOR) tablet 12.5 mg (has no administration in time range)  ?sodium chloride 0.9 % bolus 1,000 mL (0 mLs Intravenous Stopped 07/30/21 1846)  ?heparin bolus via infusion 3,300 Units (3,300 Units Intravenous Bolus from Bag 07/30/21 1819)  ?iohexol (OMNIPAQUE) 350 MG/ML injection 100 mL (100 mLs Intravenous Contrast Given 07/30/21 1735)  ?sodium chloride 0.9 % bolus 1,000 mL (0 mLs Intravenous Stopped 07/30/21 2107)  ?metoprolol tartrate (LOPRESSOR) tablet 25 mg (25 mg Oral Given 07/30/21  1829)  ?ceFEPIme (MAXIPIME) 2 g in sodium chloride 0.9 % 100 mL IVPB (0 g Intravenous Stopped 07/30/21 1925)  ? ? ? ?IMPRESSION / MDM / ASSESSMENT AND PLAN / ED COURSE  ?I reviewed the triage vital signs and the nursing notes. ? ?81 year old female with PMH as noted above presents with cool and discolored extremities noted today as well as with generalized weakness and not eating and drinking for the last several days.  Is unclear whether the patient has been taking any of her medications. ? ?I reviewed the past medical records.  The patient's most recent outpatient visits from 2/22 with her family practice, with no acute issues at that time.  She is on Eliquis.  She has no recent ED visits or admissions. ? ?On exam the patient is weak and frail appearing.  She is tachycardic to the 160s and rapid atrial fibrillation.  She is significantly hypothermic, however other vital signs are normal.  All extremities are somewhat cool.  She has a pale left foot, and the right toes appear slightly cyanotic.  The patient has normal motor function and sensation.  There is no dopplerable DP pulse on the right, however there is on the left ? ?The patient is in rapid atrial fibrillation.  Diltiazem was  given by EMS with no relief.  We will place the patient on a diltiazem infusion.  In terms of the patient's hypothermia, cool extremities, and pallor, differential includes hypoperfusion due to rapid atrial fibrillation, a

## 2021-07-30 NOTE — Plan of Care (Signed)
  Problem: Education: Goal: Knowledge of General Education information will improve Description Including pain rating scale, medication(s)/side effects and non-pharmacologic comfort measures Outcome: Progressing   Problem: Health Behavior/Discharge Planning: Goal: Ability to manage health-related needs will improve Outcome: Progressing   

## 2021-07-30 NOTE — Consult Note (Signed)
PHARMACY -  BRIEF ANTIBIOTIC NOTE  ? ?Pharmacy has received consult(s) for Vancomycin & Cefepime from an ED provider.  The patient's profile has been reviewed for ht/wt/allergies/indication/available labs.   ? ?One time order(s) placed for: ?Vancomycin 1g IV x1 in ED ?Cefepime 2g IV x1 in ED (given 1st at [place]) ? ?Further antibiotics/pharmacy consults should be ordered by admitting physician if indicated.       ?                ?Thank you, ?Shanon Brow Issaac Shipper ?07/30/2021  7:06 PM ? ?

## 2021-07-30 NOTE — Progress Notes (Signed)
eLink Physician-Brief Progress Note ?Patient Name: Catherine Orozco ?DOB: 29-Sep-1940 ?MRN: [id number] ? ? ?Date of Service ? 07/30/2021  ?HPI/Events of Note ? 80/F with history of atrial fibrillation, mild dementia, who presents with weakness. Evaluation in the ED revealed pt to be in afib with RVR. She was given fluids, cardizem in ED with improvement.   ?eICU Interventions ? Atrial fibrillation in RVR ?- Likely driven by hypovolemia, from poor intake.  ?- Given IVF with improvement.  ?- Cardizem started in ED. Will titrate as per protocol. Monitor for development of hypotension/bradycardia ?- Continue anticoagulation with heparin.  ?- Monitor electrolytes, address abnormalities as warranted.  ?- Plan for TSH, echo in AM  ? ? ? ?  ? ?[place] ?07/30/2021, 10:51 PM ?

## 2021-07-30 NOTE — ED Triage Notes (Signed)
[facility] was called out initially for pt feet being black (noticed on Wed) and pt not eating and weak. Upon EMS arrival pt HR 180 EMS gave Adenisone 6 mg 12 mg and 15 mg cardizem. Pt HR remained 140. Pt peripheral ext. Discolored. Feet are worse than hands. Pt has not been taking medicine either.  ?

## 2021-07-30 NOTE — H&P (Signed)
? ?NAME:  Catherine Orozco, MRN:  [id number], DOB:  [birth year], LOS: 0 ?ADMISSION DATE:  07/30/2021, CONSULTATION DATE: 07/30/2021 ?REFERRING MD: Arta Silence, MD, CHIEF COMPLAINT: Atrial fibrillation with RVR ? ?History of Present Illness:  ?Catherine Orozco is a 81 year old with a history of mild dementia and age-related cognitive decline and paroxysmal atrial fibrillation who presented to [facility] today via EMS with a complaint of weakness and discoloration on her legs.  Per the records available, EMS reported that the discoloration was noted by the patient's family today.  Patient has been weak and has not been refusing to eat or drink over the last several days.  Complained only of being cold and thirsty on arrival to the ED.  She did not endorse any pain.  Patient was noted to be in atrial fibrillation with rapid ventricular response and was requiring a Cardizem infusion.  Her rate however was still high and PCCM was at to admit for management of her atrial fibrillation with rapid ventricular response.  Dr. Cherylann Banas had also conferred with cardiology on all who instructed that the patient get 1 dose of metoprolol p.o.  The patient's daughter Catherine Orozco, came to bedside (she lives out of town) and reported that she had last seen her mother 2 weeks ago.  Catherine Orozco states that she has a brother that is local in town however he only sees his mother once a week.  Per Catherine Orozco she had tried to contact the patient 3 days prior however there was no answer in the home.  She got worried today because of the not having an answer and ask her aunt to come and check on the patient.  On arrival they found the patient in bed complaining of thirst and being cold and with her legs mottled.  This was conveyed to Catherine Orozco who summoned EMS to get the patient. ? ?Per Catherine Orozco, the patient lives alone however she has support from her sister.  Patient has 2 sons 1 who lives locally.  But stated that she saw her mother last 2 weeks ago and was  concerned because of the rapid decline in her physical state and demeanor.  She states "I felt that he has given up".  The patient has had issues with chronic back pain of longstanding.  She has recently stopped taking all of her medications to include Eliquis, Percocet and Neurontin. ? ?The patient currently tells me "I feel better".  Does not endorse pain anywhere.  She does not endorse any cough.  Appears very passive. ? ?History of the patient presented in A-fib with RVR.  EKG as below, personally reviewed: ?  ? ?Patient was placed on a Cardizem drip and given metoprolol 25 mg p.o. x1. ? ?Pertinent  Medical History  ?Atrial fibrillation on Eliquis ?Chronic back pain ?Cognitive decline ?Adult failure to thrive ? ?Significant Hospital Events: ?Including procedures, antibiotic start and stop dates in addition to other pertinent events   ?3/19 presented to the ED in atrial fibrillation with rapid ventricular response, hypothermia and dehydration ? ?Interim History / Subjective:  ?"I feel better now" patient does not elaborate further.  Appears passive and withdrawn. ? ?Objective   ?Blood pressure 113/65, pulse (!) 138, temperature (!) 96.4 ?F (35.8 ?C), temperature source Axillary, resp. rate 17, height 5\' 2"  (1.575 m), weight 68 kg, SpO2 99 %. ?   ?   ? ?Intake/Output Summary (Last 24 hours) at 07/30/2021 2022 ?Last data filed at 07/30/2021 1846 ?Gross per 24 hour  ?Intake 1000 ml  ?  Output --  ?Net 1000 ml  ? ?Filed Weights  ? 07/30/21 1606  ?Weight: 68 kg  ? ?Examination: ?GENERAL: Well-developed, well-nourished woman, laying comfortably in stretcher, flat.  Covered with Bair Hugger warming blanket.  Oriented to person and place.  Mild confusion with time. ?HEAD: Normocephalic, atraumatic.  ?EYES: Pupils equal, round, reactive to light.  No scleral icterus.  ?MOUTH: Very dry oral mucosa.  No thrush.  Pharynx clear ?NECK: Supple. No thyromegaly. Trachea midline. No JVD.  No adenopathy. ?PULMONARY: Good air entry  bilaterally.  No adventitious sounds. ?CARDIOVASCULAR: S1 and S2.  Regular rate and rhythm rate 135-150 murmurs not appreciated. ?ABDOMEN: Slightly diminished bowel sounds.  Soft, nondistended, nontender.  No hepatosplenomegaly ?MUSCULOSKELETAL: No joint deformity, no clubbing, no edema.  Feet warm, currently not mottled, mild rubor, per nursing color has improved with warming and volume repletion. ?NEUROLOGIC: No overt focal deficit ?SKIN: Intact,warm,dry. ?PSYCH: Passive, flat affect, mildly befuddled. ? ?Resolved Hospital Problem list   ? ? ?Assessment & Plan:  ?Atrial fibrillation with rapid ventricular response ?Patient has history of the same ?Suspect volume depletion has precipitated rapid ventricular response ?Anticoagulation with heparin IV ?Cardizem infusion wean off as tolerated ?Check magnesium level and supplement if needed ?Check thyroid function ?2D echo ?Patient received single dose of beta-blocker p.o. ?We will continue beta-blocker p.o. daily ? ?Mottling of lower extremities ?Query perfusion deficit versus potential embolic phenomena ?Resolved with volume resuscitation, warming of extremities and anticoagulation ?Continue anticoagulation for now given A-fib ?Continue volume resuscitation ?The echo given A-fib, querry cardiac embolic source ? ?Acute kidney injury, baseline creatinine unknown ?Hypovolemia ?Hypokalemia ?Volume resuscitate ?Check magnesium and replete as needed ?Replete potassium ?Nephrotoxins as able ?Maintain adequate perfusion ? ?Lactic acidosis ?Volume resuscitate ?Query sepsis, panculture ?Trend lactic acid ? ?Possible pneumonia with sepsis ?Abnormality on chest x-ray be due to infiltrate ?Empiric cefepime ?Panculture ?Trend procalcitonin and WBC ?Follow chest x-ray, consider chest CT ? ?Moderate protein malnutrition ?Adult failure to thrive ?Albumin 2.5 ?Dietitian consult ?PT OT when able to participate ?Transition of care consult ? ? ? ? ?Best Practice (right click and "Reselect  all SmartList Selections" daily)  ? ?Diet/type: clear liquids ?DVT prophylaxis: systemic heparin ?GI prophylaxis: N/A ?Lines: N/A ?Foley:  Yes, and it is still needed ?Code Status:  DNR ?Last date of multidisciplinary goals of care discussion [07/30/2021] discussion with Catherine Orozco, daughter of the patient and DPOA. ? ?Labs   ?CBC: ?Recent Labs  ?Lab 07/30/21 ?1616  ?WBC 12.9*  ?NEUTROABS 10.7*  ?HGB 13.7  ?HCT 42.4  ?MCV 89.1  ?PLT 300  ? ? ?Basic Metabolic Panel: ?Recent Labs  ?Lab 07/30/21 ?1616 07/30/21 ?1619  ?NA 143  --   ?K 3.2*  --   ?CL 109  --   ?CO2 21*  --   ?GLUCOSE 108*  --   ?BUN 34*  --   ?CREATININE 1.21*  --   ?CALCIUM 8.1*  --   ?MG  --  2.3  ? ?GFR: ?Estimated Creatinine Clearance: 34.1 mL/min (A) (by C-G formula based on SCr of 1.21 mg/dL (H)). ?Recent Labs  ?Lab 07/30/21 ?1616  ?WBC 12.9*  ?LATICACIDVEN 3.6*  ? ? ?Liver Function Tests: ?Recent Labs  ?Lab 07/30/21 ?1616  ?AST 45*  ?ALT 15  ?ALKPHOS 128*  ?BILITOT 0.9  ?PROT 6.3*  ?ALBUMIN 2.5*  ? ?No results for input(s): LIPASE, AMYLASE in the last 168 hours. ?No results for input(s): AMMONIA in the last 168 hours. ? ?ABG ?No results found for: PHART, PCO2ART,  PO2ART, HCO3, TCO2, ACIDBASEDEF, O2SAT  ? ?Coagulation Profile: ?No results for input(s): INR, PROTIME in the last 168 hours. ? ?Cardiac Enzymes: ?No results for input(s): CKTOTAL, CKMB, CKMBINDEX, TROPONINI in the last 168 hours. ? ?HbA1C: ?Hgb A1c MFr Bld  ?Date/Time Value Ref Range Status  ?10/28/2020 04:28 PM 5.3 <5.7 % of total Hgb Final  ?  Comment:  ?  For the purpose of screening for the presence of ?diabetes: ?. ?<5.7%       Consistent with the absence of diabetes ?5.7-6.4%    Consistent with increased risk for diabetes ?            (prediabetes) ?> or =6.5%  Consistent with diabetes ?Marland Kitchen ?This assay result is consistent with a decreased risk ?of diabetes. ?. ?Currently, no consensus exists regarding use of ?hemoglobin A1c for diagnosis of diabetes in children. ?. ?According to  American Diabetes Association (ADA) ?guidelines, hemoglobin A1c <7.0% represents optimal ?control in non-pregnant diabetic patients. Different ?metrics may apply to specific patient populations.  ?Standards of

## 2021-07-30 NOTE — ED Notes (Signed)
Pt at ct at this time ?

## 2021-07-30 NOTE — ED Notes (Signed)
Bear hugger and warm blankets placed on pt d/t having low temp. Family at bedside. No needs/concerns voiced at this time. ?

## 2021-07-31 ENCOUNTER — Inpatient Hospital Stay
Admit: 2021-07-31 | Discharge: 2021-07-31 | Disposition: A | Discharge status: Home / Self Care | Payer: Medicare Other | Attending: Pulmonary Disease | Admitting: Pulmonary Disease

## 2021-07-31 ENCOUNTER — Inpatient Hospital Stay: Payer: Medicare Other

## 2021-07-31 DIAGNOSIS — I4891 Unspecified atrial fibrillation: Secondary | ICD-10-CM | POA: Diagnosis not present

## 2021-07-31 LAB — URINALYSIS, ROUTINE W REFLEX MICROSCOPIC
Bilirubin Urine: NEGATIVE
Glucose, UA: NEGATIVE mg/dL
Ketones, ur: NEGATIVE mg/dL
Nitrite: NEGATIVE
Protein, ur: 30 mg/dL — AB
Specific Gravity, Urine: >1.046 — ABNORMAL HIGH (ref 1.005–1.030)
WBC, UA: >50 WBC/hpf — ABNORMAL HIGH (ref 0–5)
pH: 5 (ref 5.0–8.0)

## 2021-07-31 LAB — CBC
HCT: 37.9 % (ref 36.0–46.0)
Hemoglobin: 11.9 g/dL — ABNORMAL LOW (ref 12.0–15.0)
MCH: 28.4 pg (ref 26.0–34.0)
MCHC: 31.4 g/dL (ref 30.0–36.0)
MCV: 90.5 fL (ref 80.0–100.0)
Platelets: 244 10*3/uL (ref 150–400)
RBC: 4.19 MIL/uL (ref 3.87–5.11)
RDW: 14.1 % (ref 11.5–15.5)
WBC: 11.4 10*3/uL — ABNORMAL HIGH (ref 4.0–10.5)
nRBC: 0 % (ref 0.0–0.2)

## 2021-07-31 LAB — BASIC METABOLIC PANEL
Anion gap: 14 (ref 5–15)
BUN: 34 mg/dL — ABNORMAL HIGH (ref 8–23)
CO2: 21 mmol/L — ABNORMAL LOW (ref 22–32)
Calcium: 8 mg/dL — ABNORMAL LOW (ref 8.9–10.3)
Chloride: 101 mmol/L (ref 98–111)
Creatinine, Ser: 1.19 mg/dL — ABNORMAL HIGH (ref 0.44–1.00)
GFR, Estimated: 47 mL/min — ABNORMAL LOW (ref >60)
Glucose, Bld: 96 mg/dL (ref 70–99)
Potassium: 3.3 mmol/L — ABNORMAL LOW (ref 3.5–5.1)
Sodium: 136 mmol/L (ref 135–145)

## 2021-07-31 LAB — T4, FREE: Free T4: 1.31 ng/dL — ABNORMAL HIGH (ref 0.61–1.12)

## 2021-07-31 LAB — HEPARIN LEVEL (UNFRACTIONATED): Heparin Unfractionated: 0.28 IU/mL — ABNORMAL LOW (ref 0.30–0.70)

## 2021-07-31 LAB — CORTISOL: Cortisol, Plasma: 29.8 ug/dL

## 2021-07-31 LAB — PROCALCITONIN: Procalcitonin: 0.75 ng/mL

## 2021-07-31 LAB — TSH: TSH: 1.907 u[IU]/mL (ref 0.350–4.500)

## 2021-07-31 LAB — GLUCOSE, CAPILLARY
Glucose-Capillary: 105 mg/dL — ABNORMAL HIGH (ref 70–99)
Glucose-Capillary: 115 mg/dL — ABNORMAL HIGH (ref 70–99)
Glucose-Capillary: 114 mg/dL — ABNORMAL HIGH (ref 70–99)

## 2021-07-31 LAB — APTT: aPTT: 68 seconds — ABNORMAL HIGH (ref 24–36)

## 2021-07-31 LAB — MAGNESIUM: Magnesium: 2.1 mg/dL (ref 1.7–2.4)

## 2021-07-31 LAB — PHOSPHORUS: Phosphorus: 2.2 mg/dL — ABNORMAL LOW (ref 2.5–4.6)

## 2021-07-31 MED ORDER — VANCOMYCIN HCL 1250 MG/250ML IV SOLN
1250.0000 mg | Freq: Once | INTRAVENOUS | Status: AC
  Administered 2021-07-31: 1250 mg via INTRAVENOUS
  Filled 2021-07-31 (×2): qty 250

## 2021-07-31 MED ORDER — APIXABAN 5 MG PO TABS
5.0000 mg | ORAL_TABLET | Freq: Two times a day (BID) | ORAL | Status: DC
  Administered 2021-07-31: 5 mg via ORAL
  Filled 2021-07-31: qty 1

## 2021-07-31 MED ORDER — SODIUM CHLORIDE 0.9 % IV SOLN: 2.0000 g | INTRAVENOUS | Status: DC

## 2021-07-31 MED ORDER — CHLORHEXIDINE GLUCONATE CLOTH 2 % EX PADS
6.0000 | MEDICATED_PAD | Freq: Every day | CUTANEOUS | Status: DC
  Administered 2021-08-01 – 2021-08-02 (×2): 6 via TOPICAL

## 2021-07-31 MED ORDER — POTASSIUM PHOSPHATES 15 MMOLE/5ML IV SOLN
15.0000 mmol | Freq: Once | INTRAVENOUS | Status: AC
  Administered 2021-07-31: 15 mmol via INTRAVENOUS
  Filled 2021-07-31: qty 5

## 2021-07-31 MED ORDER — POTASSIUM CHLORIDE CRYS ER 20 MEQ PO TBCR
20.0000 meq | EXTENDED_RELEASE_TABLET | Freq: Once | ORAL | Status: AC
  Administered 2021-07-31: 20 meq via ORAL
  Filled 2021-07-31: qty 1

## 2021-07-31 MED ORDER — VANCOMYCIN HCL 1250 MG/250ML IV SOLN: 1250.0000 mg | INTRAVENOUS | Status: DC

## 2021-07-31 MED ORDER — NICOTINE 21 MG/24HR TD PT24
21.0000 mg | MEDICATED_PATCH | Freq: Every day | TRANSDERMAL | Status: DC
  Administered 2021-07-31 – 2021-08-02 (×3): 21 mg via TRANSDERMAL
  Filled 2021-07-31 (×4): qty 1

## 2021-07-31 MED ORDER — POTASSIUM CHLORIDE CRYS ER 20 MEQ PO TBCR
40.0000 meq | EXTENDED_RELEASE_TABLET | Freq: Once | ORAL | Status: DC
  Filled 2021-07-31: qty 2

## 2021-07-31 NOTE — Consult Note (Signed)
ANTICOAGULATION CONSULT NOTE - Follow Up Consult ? ?Pharmacy Consult for heparin gtt ?Indication: C/f Arterial embolus & limb ischemia ISO Afib (mixed med compliance; PTA eliquis) ? ?Allergies  ?Allergen Reactions  ? Paxil [Paroxetine Hcl]   ?  Can't sleep  ? ? ?Patient Measurements: ?Height: 5\' 2"  (157.5 cm) ?Weight: 56 kg (123 lb 7.3 oz) ?IBW/kg (Calculated) : 50.1 ?Heparin Dosing Weight: 64.2kg ? ?Vital Signs: ?Temp: 97.5 ?F (36.4 ?C) (03/20 0315) ?Temp Source: Oral (03/20 0315) ?BP: 118/66 (03/20 0430) ?Pulse Rate: 48 (03/20 0430) ? ?Labs: ?Recent Labs  ?  07/30/21 ?1616 07/30/21 ?2103 07/31/21 ?0236  ?HGB 13.7  --  11.9*  ?HCT 42.4  --  37.9  ?PLT 300  --  244  ?APTT  --  74* 68*  ?LABPROT  --  15.5*  --   ?INR  --  1.2  --   ?HEPARINUNFRC  --  0.28* 0.28*  ?CREATININE 1.21*  --  1.19*  ?TROPONINIHS 14 16  --   ? ? ? ?Estimated Creatinine Clearance: 30.3 mL/min (A) (by C-G formula based on SCr of 1.19 mg/dL (H)). ? ? ?Medications:  ?PTA: Eliquis 5mg  BID ?Inpatient: + Heparin gtt (3/19>> ?Heparin Dosing Weight: 64.2kg ? ?Assessment: ?81yo F w/ h/o Afib (on eliquis), DM, HTN, & dementia presenting with weakness and discoloration of legs. Based on presentation c/f Arterial embolus & limb ischemia ISO Afib (mixed med compliance; PTA eliquis). Planning CT Angio to assess and consult to Vasc Surgery to evaluate. Pharmacy consulted for management of heparin gtt. ? ?Date Time aPTT/HL Rate/Comment ?     ? ?Baseline Labs: ?aPTT - pending ?INR - pending ?Hgb - 13.7 ?Plts - 300 ? ?Goal of Therapy:  ?Heparin level 0.3-0.7 units/ml ?aPTT 66-102 seconds ?Monitor platelets by anticoagulation protocol: Yes ?  ?Plan:  ?3/20 @ 0236:  HL = 0.28, aPTT = 68 (therapeutic X 1)  ?- will continue to use aPTT to dose heparin until correlating with HL ?- Will draw confirmation aPTT in 8 hrs on 3/20 @ 1100.  ? ?Sebastain Fishbaugh D ?Clinical Pharmacist ?07/31/2021 4:41 AM ? ? ? ?

## 2021-07-31 NOTE — Plan of Care (Signed)
? ?  Interdisciplinary Goals of Care Family Meeting ? ? ?Date carried out: 07/31/2021 ? ?Location of the meeting: Unit ? ?Member's involved: Physician, Bedside Registered Nurse, and Family Member or next of kin ? ?Durable Power of Tour manager: patient and daughter Catherine Orozco ? ? ?Code status: Full DNR ? ?Disposition: Continue current acute care ? ? ? ?GOALS OF CARE DISCUSSION ? ?The Clinical status was relayed to family in detail- ? ?Updated and notified of patients medical condition- ?Explained to family course of therapy and the modalities  ? 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 ? ?Patient with Progressive multiorgan failure with a very high probablity of a very minimal chance of meaningful recovery despite all aggressive and optimal medical therapy.  ? ?Family understands the situation. ? ? ?Family are satisfied with Plan of action and management. All questions answered ? ?Additional CC time 35 mins ? ? ?Corrin Parker, M.D.  ?[facility]  ?Medical Director [place] ?Medical Director [facility] Cardio-Pulmonary Department  ? ? ?

## 2021-07-31 NOTE — Consult Note (Signed)
?Cardiology Consultation:  ? ?Patient ID: Catherine Orozco ?MRN: [id number]; DOB: 1940/05/26 ? ?Admit date: 07/30/2021 ?Date of Consult: 07/31/2021 ? ?PCP:  [name], MD ?  ?[facility] Providers ?Cardiologist:  Remotely seen by Dr. Rockey Situ ? ?Patient Profile:  ? ?Catherine Orozco is a 81 y.o. female with a hx of paroxysmal Afib, Alzheimer's dementia, tobacco use, DM2, neuropathy, chronic back pain, Hyperlipidemia, hypertension who is being seen 07/31/2021 for the evaluation of A-fib RVR at the request of Dr. Mortimer Fries. ? ?History of Present Illness:  ? ?History obtained through chart review and daughter who was at bedside. ? ?[name] remotely saw Dr. Rockey Situ in 2015 for afib on Eliquis 60m BID. Prior echo in 2014 showed LVEF 60-65%.  ? ?Daughter reports patient lives alone. Reports possible baseline dementia. The patient quit taking all her medications about a year ago.  She follows with primary care annually.  His note from 10/28/2020 says patient lives independently and unable to administer her own medications. Daughter suggest low desire to continue living.  She reported patient is a current smoker. Looks like patient was previously prescribed, Eliquis, fenofibrate, HCTZ, and simvastatin.  ? ?The patient presented to the ER from home on 07/30/2021.  The patient lives at home and was being checked on by family member.  It was noted that the patient was not answering the phone, suspected she was asleep or in bed for the last 3 days.  Unsure if the patient was been eating or drinking.  Her daughter asked her aunt to check on the patient.  On arrival they found her to be thirsty and cold with her legs mottled.  EMS was called. ? ?In the ER she was noted to be in rapid A-fib.  Blood pressure 120/96, pulse rate 161, respiratory rate 19, temperature 93.4 ?F.  Labs showed potassium 3.2, CO2 21, creatinine 1.21, BUN 34, albumin 2.5, AST 45, alk phos 128, WBC 12.9, lactic acid 3.6, BNP 108. HS troponin 14>66. CXR  with right lung mass. CTA abd showed PAD,  enlargement of left adrenal gland.  She was placed on IV Cardizem drip and given metoprolol 25 mg x 1. She was admitted to the ICU for further work-up.  ? ?Past Medical History:  ?Diagnosis Date  ? [place]   ? Anxiety   ? Arrhythmia   ? afib  ? Arthritis   ? [place]rial [place] 12/23[place]  ? Overview:  Last Assessment & Plan:  Maintaining normal sinus rhythm. Suggested she continue on anticoagulation and metoprolol   ? BP (high blood pressure) 10/25/2015  ? Calculus of gallbladder 12/11/2013  ? Overview:  Last Assessment & Plan:  Acceptable risk for upcoming gallbladder surgery. Suggested she stop her anticoagulation 3 days prior to surgery. She is maintaining normal sinus rhythm. This could be restarted when she is felt to be in appropriate anticoagulation candidate, would recommend at least 2 or 3 days post procedure   ? Chronic back pain   ? 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 CKD (chronic kidney disease) stage 3, GFR 30-59 ml/min (HCC)   ? Colon polyps   ? due again 04/2017  ? Depression   ? Gallstones   ? Hypertension   ? Insomnia   ? Neuropathy   ? Peripheral neuropathy   ? no lumbar radiculopathy  ? ? ?Past Surgical History:  ?Procedure Laterality Date  ? ABDOMINAL HYSTERECTOMY    ? CHOLECYSTECTOMY N/A 01/12/2014  ? Procedure: LAPAROSCOPIC CHOLECYSTECTOMY ;  Surgeon: DCoralie Keens MD;  Location: [facility] OR;  Service: General;  Laterality: N/A;  ?  ? ?Home Medications:  ?Prior to Admission medications   ?Medication Sig Start Date End Date Taking? Authorizing Provider  ?apixaban (ELIQUIS) 5 MG TABS tablet Take 1 tablet (5 mg total) by mouth 2 (two) times daily. 03/17/21   Pickard, Warren T, MD  ?DULoxetine (CYMBALTA) 30 MG capsule TAKE 1 CAPSULE BY MOUTH EVERY DAY 02/02/19   Pickard, Warren T, MD  ?fenofibrate (TRICOR) 145 MG tablet Take 1 tablet (145 mg total) by mouth daily. 03/17/21   Pickard, Warren T, MD  ?gabapentin (NEURONTIN) 600 MG tablet TAKE 1 TABLET BY MOUTH 3  TIMES DAILY  03/29/20   Pickard, Warren T, MD  ?hydrochlorothiazide (HYDRODIURIL) 25 MG tablet Take 1 tablet (25 mg total) by mouth daily. 03/17/21   Pickard, Warren T, MD  ?HYDROcodone-acetaminophen (NORCO/VICODIN) 5-325 MG tablet Take 1 tablet by mouth 2 (two) times daily as needed for moderate pain. 07/11/21   Pickard, Warren T, MD  ?hydrOXYzine (ATARAX/VISTARIL) 25 MG tablet TAKE 1 TABLET BY MOUTH EVERYDAY AT BEDTIME 03/17/21   Pickard, Warren T, MD  ?simvastatin (ZOCOR) 20 MG tablet TAKE 1 TABLET BY MOUTH EVERYDAY AT BEDTIME 07/27/21   Pickard, Warren T, MD  ? ? ?Inpatient Medications: ?Scheduled Meds: ? Chlorhexidine Gluconate Cloth  6 each Topical Daily  ? metoprolol tartrate  12.5 mg Oral BID  ? ?Continuous Infusions: ? ceFEPime (MAXIPIME) IV    ? dextrose 5% lactated ringers with KCl 20 mEq/L 100 mL/hr at 07/31/21 0700  ? diltiazem (CARDIZEM) infusion 5 mg/hr (07/31/21 0700)  ? heparin 1,000 Units/hr (07/31/21 0700)  ? potassium PHOSPHATE IVPB (in mmol) 43 mL/hr at 07/31/21 0700  ? [START ON 08/02/2021] vancomycin    ? ?PRN Meds: ?acetaminophen, docusate sodium, iohexol, ondansetron (ZOFRAN) IV, polyethylene glycol ? ?Allergies:    ?Allergies  ?Allergen Reactions  ? Paxil [Paroxetine Hcl]   ?  Can't sleep  ? ? ?Social History:   ?Social History  ? ?Socioeconomic History  ? Marital status: Widowed  ?  Spouse name: Not on file  ? Number of children: Not on file  ? Years of education: Not on file  ? Highest education level: Not on file  ?Occupational History  ? Not on file  ?Tobacco Use  ? Smoking status: Every Day  ?  Packs/day: 1.00  ?  Years: 55.00  ?  Pack years: 55.00  ?  Types: Cigarettes  ? Smokeless tobacco: Current  ?Substance and Sexual Activity  ? Alcohol use: No  ? Drug use: No  ? Sexual activity: Not on file  ?Other Topics Concern  ? Not on file  ?Social History Narrative  ? Not on file  ? ?Social Determinants of Health  ? ?Financial Resource Strain: Low Risk   ? Difficulty of Paying Living Expenses: Not hard at  all  ?Food Insecurity: No Food Insecurity  ? Worried About Running Out of Food in the Last Year: Never true  ? Ran Out of Food in the Last Year: Never true  ?Transportation Needs: No Transportation Needs  ? Lack of Transportation (Medical): No  ? Lack of Transportation (Non-Medical): No  ?Physical Activity: Inactive  ? Days of Exercise per Week: 0 days  ? Minutes of Exercise per Session: 0 min  ?Stress: No Stress Concern Present  ? Feeling of Stress : Not at all  ?Social Connections: Socially Isolated  ? Frequency of Communication with Friends and Family: More than three times a week  ?   Frequency of Social Gatherings with Friends and Family: More than three times a week  ? Attends Religious Services: Never  ? Active Member of Clubs or Organizations: No  ? Attends Club or Organization Meetings: Never  ? Marital Status: Widowed  ?Intimate Partner Violence: Not At Risk  ? Fear of Current or Ex-Partner: No  ? Emotionally Abused: No  ? Physically Abused: No  ? Sexually Abused: No  ?  ?Family History:   ?History reviewed. No pertinent family history.  ? ?ROS:  ?Please see the history of present illness.  ? ?All other ROS reviewed and negative.    ? ?Physical Exam/Data:  ? ?Vitals:  ? 07/31/21 0600 07/31/21 0630 07/31/21 0700 07/31/21 0800  ?BP: 105/66 (!) 115/93 113/72 109/71  ?Pulse: 99 79 98 95  ?Resp: (!) 21 (!) 21 20 (!) 21  ?Temp:    97.6 ?F (36.4 ?C)  ?TempSrc:    Axillary  ?SpO2: 97% 100% 98% 99%  ?Weight:      ?Height:      ? ? ?Intake/Output Summary (Last 24 hours) at 07/31/2021 0854 ?Last data filed at 07/31/2021 0700 ?Gross per 24 hour  ?Intake 3438.59 ml  ?Output 500 ml  ?Net 2938.59 ml  ? ?Last 3 Weights 07/31/2021 07/30/2021 07/30/2021  ?Weight (lbs) 123 lb 7.3 oz 125 lb 10.6 oz 150 lb  ?Weight (kg) 56 kg 57 kg 68.04 kg  ?   ?Body mass index is 22.58 kg/m?.  ?General:  frail elderly WF ?HEENT: normal ?Neck: no JVD ?Vascular: No carotid bruits; Distal pulses 2+ bilaterally ?Cardiac:  normal S1, S2; Irreg Irreg; no  murmur  ?Lungs:  clear to auscultation bilaterally, no wheezing, rhonchi or rales  ?Abd: soft, nontender, no hepatomegaly  ?Ext: no edema ?Musculoskeletal:  No deformities, BUE and BLE strength normal an

## 2021-07-31 NOTE — Progress Notes (Signed)
? ?NAME:  Catherine Orozco, MRN:  [id number], DOB:  1940-11-05, LOS: 1 ?ADMISSION DATE:  07/30/2021 ? ?BRIEF SYNOPSIS ?  ?Ms. Catherine Orozco is a 81 year old with a history of mild dementia and age-related cognitive decline and paroxysmal atrial fibrillation who presented to [facility] today via EMS with a complaint of weakness and discoloration on her legs.  Per the records available, EMS reported that the discoloration was noted by the patient's family today.  Patient has been weak and has not been refusing to eat or drink over the last several days.  Complained only of being cold and thirsty on arrival to the ED.  She did not endorse any pain.  Patient was noted to be in atrial fibrillation with rapid ventricular response and was requiring a Cardizem infusion.  Her rate however was still high and PCCM was at to admit for management of her atrial fibrillation with rapid ventricular response.  Dr. Cherylann Banas had also conferred with cardiology on all who instructed that the patient get 1 dose of metoprolol p.o.  The patient's daughter Catherine Orozco, came to bedside (she lives out of town) and reported that she had last seen her mother 2 weeks ago.  Catherine Orozco states that she has a brother that is local in town however he only sees his mother once a week.  Per Catherine Orozco she had tried to contact the patient 3 days prior however there was no answer in the home.  She got worried today because of the not having an answer and ask her aunt to come and check on the patient.  On arrival they found the patient in bed complaining of thirst and being cold and with her legs mottled.  This was conveyed to [place] who summoned EMS to get the patient. ?  ?Per Catherine Orozco, the patient lives alone however she has support from her sister.  Patient has 2 sons 1 who lives locally.  But stated that she saw her mother last 2 weeks ago and was concerned because of the rapid decline in her physical state and demeanor.  She states "I felt that he has given up".  The  patient has had issues with chronic back pain of longstanding.  She has recently stopped taking all of her medications to include Eliquis, Percocet and Neurontin. ?  ?The patient currently tells me "I feel better".  Does not endorse pain anywhere.  She does not endorse any cough.  Appears very passive. ?  ?History of the patient presented in A-fib with RVR.  EKG as below, personally reviewed: ?  ?  ?Patient was placed on a Cardizem drip and given metoprolol 25 mg p.o. x1. ?  ?Pertinent  Medical History  ?Atrial fibrillation on Eliquis ?Chronic back pain ?Cognitive decline ?Adult failure to thrive ?  ?Significant Hospital Events: ?Including procedures, antibiotic start and stop dates in addition to other pertinent events   ?3/19 presented to the ED in atrial fibrillation with rapid ventricular response, hypothermia and dehydration ?3/20 on DILT, heparin infusions, alert and awake ? ? ?Antimicrobials:  ? ?Antibiotics Given (last 72 hours)   ? ? Date/Time Action Medication Dose Rate  ? 07/30/21 1855 New Bag/Given  ? ceFEPIme (MAXIPIME) 2 g in sodium chloride 0.9 % 100 mL IVPB 2 g 200 mL/hr  ? 07/31/21 0314 New Bag/Given  ? vancomycin (VANCOREADY) IVPB 1250 mg/250 mL 1,250 mg 166.7 mL/hr  ? ?  ? ? ? ? ? ? ? ?Interim History / Subjective:  ?Remains alert and awake ?DNR/DNI ?On  DILT, heparin drips ?Follow up cardiology recs ? ? ?Objective   ?Blood pressure 113/72, pulse 98, temperature (!) 97.5 ?F (36.4 ?C), temperature source Oral, resp. rate 20, height 5\' 2"  (1.575 m), weight 56 kg, SpO2 98 %. ?   ?   ? ?Intake/Output Summary (Last 24 hours) at 07/31/2021 0747 ?Last data filed at 07/31/2021 0700 ?Gross per 24 hour  ?Intake 3438.59 ml  ?Output 500 ml  ?Net 2938.59 ml  ? ?Filed Weights  ? 07/30/21 1606 07/30/21 2150 07/31/21 0424  ?Weight: 68 kg 57 kg 56 kg  ? ? ?Review of Systems: ? ?Gen:  Denies  fever, sweats, chills weight loss  ?HEENT: Denies blurred vision, double vision, ear pain, eye pain, hearing loss, nose bleeds,  sore throat ?Cardiac:  No dizziness, chest pain or heaviness, chest tightness,edema, No JVD ?Resp:   No cough, -sputum production, -shortness of breath,-wheezing, -hemoptysis,  ? ? ?Physical Examination:  ? ?General Appearance: No distress  ?EYES PERRLA, EOM intact.   ?NECK Supple, No JVD ?Pulmonary: normal breath sounds, No wheezing.  ?CardiovascularNormal S1,S2.  No m/r/g.   ?Abdomen: Benign, Soft, non-tender. ?Skin:   warm, no rashes, no ecchymosis  ?Extremities: normal, no cyanosis, clubbing. ?Neuro:without focal findings,  speech normal  ?PSYCHIATRIC: Mood, affect within normal limits. ? ? ?ALL OTHER ROS ARE NEGATIVE ? ? ? ? ?Labs/imaging that I havepersonally reviewed  ?(right click and "Reselect all SmartList Selections" daily)  ? ? ? ?ASSESSMENT AND PLAN ?SYNOPSIS ? ?81 yo WF with admitted for afib with RVR with demand ischemia  ? ?CARDIAC FAILURE-afib with RVR with demand ischemia  ?-oxygen as needed ?-Lasix as tolerated ?-follow up cardiac enzymes as indicated ?CARDIOLOGY CONSULTED ? ? ?CARDIAC ?ICU monitoring ? ? ?ACUTE KIDNEY INJURY/Renal Failure ?-continue Foley Catheter-assess need ?-Avoid nephrotoxic agents ?-Follow urine output, BMP ?-Ensure adequate renal perfusion, optimize oxygenation ?-Renal dose medications ? ? ?Intake/Output Summary (Last 24 hours) at 07/31/2021 0747 ?Last data filed at 07/31/2021 0700 ?Gross per 24 hour  ?Intake 3438.59 ml  ?Output 500 ml  ?Net 2938.59 ml  ? ? ? ?NEUROLOGY ?intact ? ? ?ENDO ?- ICU hypoglycemic\Hyperglycemia protocol ?-check FSBS per protocol ? ? ?GI ?GI PROPHYLAXIS as indicated ? ?NUTRITIONAL STATUS ?DIET-->as tolerated ?Constipation protocol as indicated ? ? ?ELECTROLYTES ?-follow labs as needed ?-replace as needed ?-pharmacy consultation and following ? ?Abnormal CXR LLL mass ?Plan for CT chest ? ? ?Best practice (right click and "Reselect all SmartList Selections" daily)  ?Diet:  As tolerated ?DVT prophylaxis: Systemic AC ?Mobility:  bed rest  ?Code Status:   FULL CODE ?Disposition: ICU ?DNR/DNI ? ?Labs   ?CBC: ?Recent Labs  ?Lab 07/30/21 ?1616 07/31/21 ?0236  ?WBC 12.9* 11.4*  ?NEUTROABS 10.7*  --   ?HGB 13.7 11.9*  ?HCT 42.4 37.9  ?MCV 89.1 90.5  ?PLT 300 244  ? ? ?Basic Metabolic Panel: ?Recent Labs  ?Lab 07/30/21 ?1616 07/30/21 ?1619 07/31/21 ?0236  ?NA 143  --  136  ?K 3.2*  --  3.3*  ?CL 109  --  101  ?CO2 21*  --  21*  ?GLUCOSE 108*  --  96  ?BUN 34*  --  34*  ?CREATININE 1.21*  --  1.19*  ?CALCIUM 8.1*  --  8.0*  ?MG  --  2.3 2.1  ?PHOS  --   --  2.2*  ? ?GFR: ?Estimated Creatinine Clearance: 30.3 mL/min (A) (by C-G formula based on SCr of 1.19 mg/dL (H)). ?Recent Labs  ?Lab 07/30/21 ?1616 07/30/21 ?2103 07/30/21 ?  2316 07/31/21 ?0236  ?PROCALCITON  --   --  0.75 0.75  ?WBC 12.9*  --   --  11.4*  ?LATICACIDVEN 3.6* 1.5  --   --   ? ? ?Liver Function Tests: ?Recent Labs  ?Lab 07/30/21 ?1616  ?AST 45*  ?ALT 15  ?ALKPHOS 128*  ?BILITOT 0.9  ?PROT 6.3*  ?ALBUMIN 2.5*  ? ?No results for input(s): LIPASE, AMYLASE in the last 168 hours. ?No results for input(s): AMMONIA in the last 168 hours. ? ?ABG ?No results found for: PHART, PCO2ART, PO2ART, HCO3, TCO2, ACIDBASEDEF, O2SAT  ? ?Coagulation Profile: ?Recent Labs  ?Lab 07/30/21 ?2103  ?INR 1.2  ? ? ?Cardiac Enzymes: ?No results for input(s): CKTOTAL, CKMB, CKMBINDEX, TROPONINI in the last 168 hours. ? ?HbA1C: ?Hgb A1c MFr Bld  ?Date/Time Value Ref Range Status  ?10/28/2020 04:28 PM 5.3 <5.7 % of total Hgb Final  ?  Comment:  ?  For the purpose of screening for the presence of ?diabetes: ?. ?<5.7%       Consistent with the absence of diabetes ?5.7-6.4%    Consistent with increased risk for diabetes ?            (prediabetes) ?> or =6.5%  Consistent with diabetes ?Marland Kitchen ?This assay result is consistent with a decreased risk ?of diabetes. ?. ?Currently, no consensus exists regarding use of ?hemoglobin A1c for diagnosis of diabetes in children. ?. ?According to American Diabetes Association (ADA) ?guidelines, hemoglobin A1c  <7.0% represents optimal ?control in non-pregnant diabetic patients. Different ?metrics may apply to specific patient populations.  ?Standards of Medical Care in Diabetes(ADA). ?. ?  ?01/12/2020 02:26 PM 5.7 (

## 2021-07-31 NOTE — Progress Notes (Signed)
Inserted intermittent catheter, bladder scan showed over 350 in bladder, drained 500 ml's of clear urine.  ?

## 2021-07-31 NOTE — Progress Notes (Signed)
On initial assessment, patient reports she's in the hospital "because people won't leave her alone."  Refuses to cooperate with portions of the exam, and requests that we leave her be.  Otherwise appears in no apparent distress, will continue to monitor. ?

## 2021-07-31 NOTE — Plan of Care (Signed)
?  Problem: Elimination: ?Goal: Will not experience complications related to bowel motility ?Outcome: Progressing ?  ?Problem: Activity: ?Goal: Ability to tolerate increased activity will improve ?Outcome: Progressing ?  ?

## 2021-07-31 NOTE — Progress Notes (Signed)
Pharmacy Antibiotic Note ? ?Catherine Orozco is a 81 y.o. female admitted on 07/30/2021 with sepsis.  Pharmacy has been consulted for Vancomycin, Cefepime dosing. ? ?Plan: ?Cefepime 2 gm IV X 1 given on 3/19 @ 1855. ?Cefepime 2 gm IV Q24H ordered to start on 3/20 @ 1900.  ? ?Vancomycin 1250 mg IV X 1 ordered for 3/20 @ 0300. ?Vancomycin 1250 mg IV Q48H ordered to start on 3/22 @ 0300. ? ?AUC = 522.4 ?Vanc trough = 10.4  ? ?Height: 5\' 2"  (157.5 cm) ?Weight: 57 kg (125 lb 10.6 oz) ?IBW/kg (Calculated) : 50.1 ? ?Temp (24hrs), Avg:96.7 ?F (35.9 ?C), Min:93.4 ?F (34.1 ?C), Max:98.4 ?F (36.9 ?C) ? ?Recent Labs  ?Lab 07/30/21 ?1616 07/30/21 ?2103  ?WBC 12.9*  --   ?CREATININE 1.21*  --   ?LATICACIDVEN 3.6* 1.5  ?  ?Estimated Creatinine Clearance: 29.8 mL/min (A) (by C-G formula based on SCr of 1.21 mg/dL (H)).   ? ?Allergies  ?Allergen Reactions  ? Paxil [Paroxetine Hcl]   ?  Can't sleep  ? ? ?Antimicrobials this admission: ?  >>  ?  >>  ? ?Dose adjustments this admission: ? ? ?Microbiology results: ? BCx:  ? UCx:   ? Sputum:   ? MRSA PCR:  ? ?Thank you for allowing pharmacy to be a part of this patient?s care. ? ?Catherine Orozco D ?07/31/2021 2:19 AM ? ?

## 2021-08-01 DIAGNOSIS — R918 Other nonspecific abnormal finding of lung field: Secondary | ICD-10-CM

## 2021-08-01 DIAGNOSIS — I4891 Unspecified atrial fibrillation: Secondary | ICD-10-CM | POA: Diagnosis not present

## 2021-08-01 DIAGNOSIS — Z7189 Other specified counseling: Secondary | ICD-10-CM

## 2021-08-01 DIAGNOSIS — I739 Peripheral vascular disease, unspecified: Secondary | ICD-10-CM

## 2021-08-01 LAB — LEGIONELLA PNEUMOPHILA SEROGP 1 UR AG: L. pneumophila Serogp 1 Ur Ag: NEGATIVE

## 2021-08-01 LAB — STREP PNEUMONIAE URINARY ANTIGEN: Strep Pneumo Urinary Antigen: NEGATIVE

## 2021-08-01 MED ORDER — HALOPERIDOL LACTATE 2 MG/ML PO CONC
0.5000 mg | ORAL | Status: DC | PRN
  Filled 2021-08-01: qty 0.3

## 2021-08-01 MED ORDER — MORPHINE SULFATE (CONCENTRATE) 10 MG/0.5ML PO SOLN
5.0000 mg | ORAL | Status: DC | PRN
  Filled 2021-08-01: qty 0.5

## 2021-08-01 MED ORDER — METOPROLOL SUCCINATE ER 25 MG PO TB24
25.0000 mg | ORAL_TABLET | Freq: Every day | ORAL | Status: DC
  Administered 2021-08-01 – 2021-08-02 (×2): 25 mg via ORAL
  Filled 2021-08-01 (×2): qty 1

## 2021-08-01 MED ORDER — LORAZEPAM 1 MG PO TABS
1.0000 mg | ORAL_TABLET | ORAL | Status: DC | PRN
Start: 2021-08-01 — End: 2021-08-02

## 2021-08-01 MED ORDER — HALOPERIDOL LACTATE 5 MG/ML IJ SOLN
0.5000 mg | INTRAMUSCULAR | Status: DC | PRN
Start: 2021-08-01 — End: 2021-08-02

## 2021-08-01 MED ORDER — LORAZEPAM 2 MG/ML PO CONC
1.0000 mg | ORAL | Status: DC | PRN
Start: 2021-08-01 — End: 2021-08-02

## 2021-08-01 MED ORDER — HALOPERIDOL 0.5 MG PO TABS
0.5000 mg | ORAL_TABLET | ORAL | Status: DC | PRN
  Filled 2021-08-01: qty 1

## 2021-08-01 MED ORDER — MORPHINE SULFATE (CONCENTRATE) 10 MG/0.5ML PO SOLN
5.0000 mg | Freq: Four times a day (QID) | ORAL | Status: DC
  Administered 2021-08-01 – 2021-08-02 (×3): 5 mg via SUBLINGUAL
  Filled 2021-08-01 (×2): qty 0.5

## 2021-08-01 MED ORDER — LORAZEPAM 2 MG/ML IJ SOLN: 1.0000 mg | INTRAMUSCULAR | Status: DC | PRN

## 2021-08-01 NOTE — Assessment & Plan Note (Signed)
Heart rate improved with Cardizem infusion, cardiology started her on metoprolol and Cardizem infusion was discontinued last night. ?Patient developed melanotic stool with anticoagulation so it is placed on hold. ?As patient refusing further care-cardiology signed off. ?Echocardiogram ordered- patient refused ?-Continue with metoprolol ?-Will need anticoagulation if changed her mind regarding hospice. ?

## 2021-08-01 NOTE — Assessment & Plan Note (Signed)
Patient has stopped taking her home statin and does not want to restart. ?-We will restart statin due to extensive PAD if she decided against her current wishes of being hospice only. ?

## 2021-08-01 NOTE — Progress Notes (Signed)
? ?Progress Note ? ?Patient Name: Catherine Orozco ?Date of Encounter: 08/01/2021 ? ?[facility] Cardiologist: Esmond Plants, MD PhD ? ?Subjective  ? ?Patient complains of hurting all over as well as burning in her feet.  No chest pain, palpitations, or shortness of breath.  She was transitioned from IV heparin to apixaban yesterday evening but had a large melanotic bowel movement prompting discontinuation of oral anticoagulation.  She is refusing blood draws.  Echocardiogram was also canceled by Dr. Mortimer Fries based on his discussion with the patient regarding goals of care.  Diltiazem infusion weaned off last night. ? ?Inpatient Medications  ?  ?Scheduled Meds: ? Chlorhexidine Gluconate Cloth  6 each Topical Daily  ? metoprolol tartrate  12.5 mg Oral BID  ? nicotine  21 mg Transdermal Daily  ? ?Continuous Infusions: ? dextrose 5% lactated ringers with KCl 20 mEq/L 100 mL/hr at 08/01/21 0900  ? diltiazem (CARDIZEM) infusion Stopped (07/31/21 2131)  ? ?PRN Meds: ?acetaminophen, docusate sodium, iohexol, ondansetron (ZOFRAN) IV, polyethylene glycol  ? ?Vital Signs  ?  ?Vitals:  ? 08/01/21 0500 08/01/21 0600 08/01/21 0700 08/01/21 0800  ?BP: 104/63 (!) 111/53 111/75 115/68  ?Pulse: (!) 101 (!) 102 98 (!) 109  ?Resp: (!) 23 20 (!) 24 (!) 23  ?Temp:    97.7 ?F (36.5 ?C)  ?TempSrc:    Oral  ?SpO2: 99% 100% 100% 100%  ?Weight:      ?Height:      ? ? ?Intake/Output Summary (Last 24 hours) at 08/01/2021 6546 ?Last data filed at 08/01/2021 0900 ?Gross per 24 hour  ?Intake 2822.68 ml  ?Output 300 ml  ?Net 2522.68 ml  ? ?Last 3 Weights 08/01/2021 07/31/2021 07/30/2021  ?Weight (lbs) 132 lb 15 oz 123 lb 7.3 oz 125 lb 10.6 oz  ?Weight (kg) 60.3 kg 56 kg 57 kg  ?   ? ?Telemetry  ?  ?Atrial fibrillation with ventricular rates 80-110 bpm (mostly 90-100 bpm). - Personally Reviewed ? ?ECG  ?  ?No new tracing ? ?Physical Exam  ? ?GEN: No acute distress.  Pallor noted. ?Neck: No JVD ?Cardiac: Irregularly irregular rhythm without murmurs, rubs,  or gallops. ?Respiratory: Coarse breath sounds bilaterally. ?GI: Soft, nontender, non-distended  ?MS: No edema; No deformity. ?Neuro:  Nonfocal  ?Psych: Flat affect ? ?Labs  ?  ?High Sensitivity Troponin:   ?Recent Labs  ?Lab 07/30/21 ?1616 07/30/21 ?2103  ?TROPONINIHS 14 16  ?   ?Chemistry ?Recent Labs  ?Lab 07/30/21 ?1616 07/30/21 ?1619 07/31/21 ?0236  ?NA 143  --  136  ?K 3.2*  --  3.3*  ?CL 109  --  101  ?CO2 21*  --  21*  ?GLUCOSE 108*  --  96  ?BUN 34*  --  34*  ?CREATININE 1.21*  --  1.19*  ?CALCIUM 8.1*  --  8.0*  ?MG  --  2.3 2.1  ?PROT 6.3*  --   --   ?ALBUMIN 2.5*  --   --   ?AST 45*  --   --   ?ALT 15  --   --   ?ALKPHOS 128*  --   --   ?BILITOT 0.9  --   --   ?GFRNONAA 46*  --  47*  ?ANIONGAP 13  --  14  ?  ?Lipids No results for input(s): CHOL, TRIG, HDL, LABVLDL, LDLCALC, CHOLHDL in the last 168 hours.  ?Hematology ?Recent Labs  ?Lab 07/30/21 ?1616 07/31/21 ?0236  ?WBC 12.9* 11.4*  ?RBC 4.76 4.19  ?HGB  13.7 11.9*  ?HCT 42.4 37.9  ?MCV 89.1 90.5  ?MCH 28.8 28.4  ?MCHC 32.3 31.4  ?RDW 13.8 14.1  ?PLT 300 244  ? ?Thyroid  ?Recent Labs  ?Lab 07/31/21 ?0236  ?TSH 1.907  ?FREET4 1.31*  ?  ?BNP ?Recent Labs  ?Lab 07/30/21 ?1616  ?BNP 108.5*  ?  ?DDimer No results for input(s): DDIMER in the last 168 hours.  ? ?Radiology  ?  ?CT CHEST WO CONTRAST ? ?Result Date: 07/31/2021 ?CLINICAL DATA:  81 year old female history of pulmonary nodule. Followup study. EXAM: CT CHEST WITHOUT CONTRAST TECHNIQUE: Multidetector CT imaging of the chest was performed following the standard protocol without IV contrast. RADIATION DOSE REDUCTION: This exam was performed according to the departmental dose-optimization program which includes automated exposure control, adjustment of the mA and/or kV according to patient size and/or use of iterative reconstruction technique. COMPARISON:  Chest CT 01/11/2014. FINDINGS: Cardiovascular: Heart size is borderline enlarged. Small amount of pericardial fluid and/or thickening anteriorly,  unlikely to be of any hemodynamic significance at this time. No pericardial calcification. There is aortic atherosclerosis, as well as atherosclerosis of the great vessels of the mediastinum and the coronary arteries, including calcified atherosclerotic plaque in the left main, left anterior descending and right coronary arteries. Mediastinum/Nodes: Hilar structures are poorly evaluated on today's noncontrast CT examination. With this limitation in mind, there is apparent right hilar lymphadenopathy measuring up to 1.5 cm in short axis (axial image 64 of series 2). Several other prominent but nonenlarged right paratracheal lymph nodes are noted (nonspecific). Esophagus is unremarkable in appearance. No axillary lymphadenopathy. Lungs/Pleura: In the right lower lobe (axial image 92 of series 3 and sagittal image 41 of series 6) there is a 3.0 x 2.8 x 2.1 cm macrolobulated mass with some spiculated margins and adjacent cystic component which is likely part of the lesion (axial image 85 of series 3). Additionally, there are several nodular appearing areas of architectural distortion in the posterior aspect of the left upper lobe, best appreciated on axial image 35 of series 3 where these appear to be comprised of areas of septal thickening. These are unusual and of uncertain etiology and significance. No confluent consolidative airspace disease. No pleural effusions. Areas of passive atelectasis are noted in the lower lobes of the lungs bilaterally. Mild diffuse bronchial wall thickening with mild centrilobular and paraseptal emphysema. Upper Abdomen: Aortic atherosclerosis. Cortical scarring in the upper pole of the right kidney. Moderate to severe left hydroureteronephrosis (see report for recent CTA of the abdomen and pelvis for full details). Status post cholecystectomy. Musculoskeletal: There are no aggressive appearing lytic or blastic lesions noted in the visualized portions of the skeleton. IMPRESSION: 1. 3.0 x  2.8 x 2.1 cm right lower lobe mass with probable right hilar lymphadenopathy, highly concerning for primary bronchogenic neoplasm. Further evaluation with PET-CT is recommended in the near future. 2. Unusual nodular areas of architectural distortion in the left upper lobe. These are of uncertain etiology and significance. Attention on follow-up studies is recommended to ensure stability or regression. 3. Aortic atherosclerosis, in addition to left main and 2 vessel coronary artery disease. Assessment for potential risk factor modification, dietary therapy or pharmacologic therapy may be warranted, if clinically indicated. 4. Mild diffuse bronchial wall thickening with mild centrilobular and paraseptal emphysema; imaging findings suggestive of underlying COPD. 5. Additional incidental findings, as above. Aortic Atherosclerosis (ICD10-I70.0) and Emphysema (ICD10-J43.9). Electronically Signed   By: Vinnie Langton M.D.   On: 07/31/2021 12:03  ? ?CT  Angio Aortobifemoral W and/or Wo Contrast ? ?Result Date: 07/31/2021 ?CLINICAL DATA:  Arterial embolism, lower extremity. EXAM: CT ANGIOGRAPHY OF ABDOMINAL AORTA WITH ILIOFEMORAL RUNOFF TECHNIQUE: Multidetector CT imaging of the abdomen, pelvis and lower extremities was performed using the standard protocol during bolus administration of intravenous contrast. Multiplanar CT image reconstructions and MIPs were obtained to evaluate the vascular anatomy. RADIATION DOSE REDUCTION: This exam was performed according to the departmental dose-optimization program which includes automated exposure control, adjustment of the mA and/or kV according to patient size and/or use of iterative reconstruction technique. CONTRAST:  126mL OMNIPAQUE IOHEXOL 350 MG/ML SOLN COMPARISON:  CT abdomen and pelvis 04/13/2014 FINDINGS: VASCULAR Aorta: Circumferential irregular mural thrombus in the abdominal aorta without aneurysm, dissection or significant stenosis. Celiac: Patent without evidence of  aneurysm, dissection, vasculitis or significant stenosis. SMA: Proximal SMA is widely patent. There is noncalcified plaque approximately 3 cm from the origin causes close to 50% narrowing. Distal SMA is pate

## 2021-08-01 NOTE — Assessment & Plan Note (Signed)
Currently blood pressure within goal. ?-Continue with metoprolol ?

## 2021-08-01 NOTE — Assessment & Plan Note (Signed)
Not on any medications at home.  CBG within goal. ?Patient refusing further testing ?

## 2021-08-01 NOTE — Consult Note (Signed)
? ?                                                                                ?Consultation Note ?Date: 08/01/2021  ? ?Patient Name: Catherine Orozco  ?DOB: 1941-04-01  MRN: [id number]  Age / Sex: 81 y.o., female  ?PCP: [name], MD ?Referring Physician: Lorella Nimrod, MD ? ?Reason for Consultation:  "establish goals of care" ? ?HPI/Patient Profile: 81 y.o. female  with past medical history of mild dementia, paroxysmal a-fib admitted on 07/30/2021 with a fib with RVR, lower extremity DVT's with large clot burden, hydronephrosis r/t renal calculis, as well as findings on CT scan suspicious for malignancy. She was started on anticoag but this was complicated by GI bleeding. On consult with attending team- family and patient expressed their desire to go home and have no further workup. Palliative medicine consulted for Catherine Orozco.   ? ?Primary Decision Maker ?PATIENT- and her daughter ? ?Discussion: ?Chart reviewed including imaging, labs, progress notes. Report received from attending team and bedside RN.  ?Evaluated patient. She was awake and alert. Daughter at bedside. Patient with limited involvement in [place] discussion per her choice. She would only state that her only wish was to go home and smoke a cigarette.  ?Catherine Orozco supports Catherine Orozco in this [place]- to go home, be in comfort, eat and drink what she wants, smoke her cigarettes. Catherine Orozco is familiar with Hospice philosophy.  ?We discussed transition to full comfort measures only. They are not interested in anticoag due to the burden of it causing GI bleeding. We discussed using pain medications to manage symptoms of DVTs. Catherine Orozco is in agreement with starting scheduled morphine for pain relief. Yariah endorses pain in her feet, lower legs and her whole body.  ?Hospice philosophy and services were reviewed.  ?Patient is not likely appropriate for inpatient Hospice at this point in her journey- but may need this service in the future.  ?Catherine Orozco has several family members in  the area and states they will pool their resources to care for [place] in their home.  ?  ? ?SUMMARY OF RECOMMENDATIONS ?-Transition to full comfort measures only- d/c IV fluids, labs, cardiac monitoring, and other interventions not necessary for comfort ?-TOC referral for Hospice services at home ?-Morphine solution 5mg  po q 6 hours scheduled at q1hr prn for SOB or breakthrough pain ?On discharge, would recommend scripts for: ?- Morphine Concentrate 10mg /0.66ml: 5mg  (0.80ml) sublingual every 1 hour as needed for pain or shortness of breath: Disp 66ml ?- Lorazepam 2mg /ml concentrated solution: 1mg  (0.30ml) sublingual every 4 hours as needed for anxiety: Disp 57ml ?- Haldol 2mg /ml solution: 0.5mg  (0.91ml) sublingual every 4 hours as needed for agitation or nausea: Disp 3ml  ?   ? ?Code Status/Advance Care Planning: ?DNR ? ? ?Prognosis:   ?< 3 months ? ?Discharge Planning: Home with Hospice ? ?Primary Diagnoses: ?Present on Admission: ? Atrial fibrillation with rapid ventricular response ([place]) ? Essential hypertension ? Hyperlipidemia ? ? ?Review of Systems ? ?Physical Exam ? ?Vital Signs: BP 114/79 (BP Location: Left Arm)   Pulse (!) 115   Temp 97.9 ?F (36.6 ?C) (Oral)   Resp 18   Ht 5'  2" (1.575 m)   Wt 60.3 kg   SpO2 100%   BMI 24.31 kg/m?  ?Pain Scale: PAINAD ?  ?Pain Score: 8  ? ? ?SpO2: SpO2: 100 % ?O2 Device:SpO2: 100 % ?O2 Flow Rate: .  ? ?IO: Intake/output summary:  ?Intake/Output Summary (Last 24 hours) at 08/01/2021 1507 ?Last data filed at 08/01/2021 1200 ?Gross per 24 hour  ?Intake 3132.22 ml  ?Output 300 ml  ?Net 2832.22 ml  ? ? ?LBM: Last BM Date : 08/01/21 ?Baseline Weight: Weight: 68 kg ?Most recent weight: Weight: 60.3 kg    ? ? ? ? ? ?Thank you for this consult. Palliative medicine will continue to follow and assist as needed.  ?Signed by: ?Mariana Kaufman, AGNP-C ?Palliative Medicine ? ?  ?Please contact Palliative Medicine Team phone at [phone] for questions and concerns.  ?For individual provider:  See Amion ? ? ? ? ? ? ? ? ? ? ? ? ? ? ?

## 2021-08-01 NOTE — Progress Notes (Signed)
?Progress Note ? ? ?Patient: Catherine Orozco EYC:[id number] DOB: 1940-12-11 DOA: 07/30/2021     2 ?DOS: the patient was seen and examined on 08/01/2021 ?  ?Brief hospital course: ?ICU transfer. ? ?Taken from prior notes. ?Ms. Catherine Orozco is a 81 year old with a history of mild dementia and age-related cognitive decline and paroxysmal atrial fibrillation who presented to [facility] on the day of admission via EMS with a complaint of weakness and discoloration on her legs, apparently noted by family the same day. ?Apparently patient was living alone, stopped taking all of her medications for for a while which include Eliquis, Percocet and Neurontin.  Daughter sent her are not when she did not get any response after multiple calls for couple of days.  Family was also concerned about declining health for some time. ? ?She was noted to be in atrial fibrillation with RVR requiring Cardizem infusion.  Cardiology was consulted due to difficult to control heart rate and she was admitted in ICU.  Heparin infusion was also started. ? ?Patient underwent CTA of lower extremities and found to have bilateral occlusion of SFA with some collaterals in place which shows that it is not acute.  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  She was noted to have moderate left hydronephrosis secondary to a 7 mm stone in the distal left ureter.  There was also noted some new nodularity and enlargement of left adrenal gland which could represent metastatic lesion and a suspicious opacity in lung which prompted CT chest which was positive for a 3 cm right lower lobe mass with probable right hilar lymphadenopathy, highly concerning for primary bronchogenic neoplasm.  There was also unusual nodular areas of architectural distortion in the left upper lobe, of uncertain etiology and significance. ? ?Lab work was significant for UA with concern of UTI, cultures pending, procalcitonin at 0.75, mildly elevated BUN at 34 and creatinine of  1.19, leukocytosis at 12.9>>11.4.  Blood cultures remain negative. ? ?PCCM has a discussion with patient and daughter, they does not want to pursue further investigation for a final diagnosis and would like to go home with home hospice services. ?They also refused echocardiogram and further blood draws. ? ?Her heparin was switched with Eliquis but patient had 1 melanotic stool resulted in holding anticoagulation. ? ?TRH to resume care on 08/01/2021. ?Palliative care was consulted-pending consult. ? ? ? ? ?Assessment and Plan: ?* Atrial fibrillation with rapid ventricular response (HCC) ?Heart rate improved with Cardizem infusion, cardiology started her on metoprolol and Cardizem infusion was discontinued last night. ?Patient developed melanotic stool with anticoagulation so it is placed on hold. ?As patient refusing further care-cardiology signed off. ?Echocardiogram ordered- patient refused ?-Continue with metoprolol ?-Will need anticoagulation if changed her mind regarding hospice. ? ?Essential hypertension ?Currently blood pressure within goal. ?-Continue with metoprolol ? ?Hyperlipidemia ?Patient has stopped taking her home statin and does not want to restart. ?-We will restart statin due to extensive PAD if she decided against her current wishes of being hospice only. ? ?Controlled type 2 diabetes mellitus without complication ([place]) ?Not on any medications at home.  CBG within goal. ?Patient refusing further testing ? ?Goals of care, counseling/discussion ?Patient refusing further management and would like to stay at home with hospice services.  She has a prior history of depression. ?Patient and daughter both are on the same page regarding palliative services. ?-Palliative care was consulted-will appreciate their help ? ? ?Subjective: Patient was seen and examined today.  Denies any pain.  Daughter at bedside.  Per nursing staff she had 1 tarry color stool so anticoagulation was placed on hold. ? ?Physical  Exam: ?Vitals:  ? 08/01/21 0700 08/01/21 0800 08/01/21 1006 08/01/21 1209  ?BP: 111/75 115/68 121/80 114/79  ?Pulse: 98 (!) 109  (!) 115  ?Resp: (!) 24 (!) 23 (!) 24 18  ?Temp:  97.7 ?F (36.5 ?C)  97.9 ?F (36.6 ?C)  ?TempSrc:  Oral  Oral  ?SpO2: 100% 100%  100%  ?Weight:      ?Height:      ? ?General.  Ill-appearing elderly lady, in no acute distress. ?Pulmonary.  Lungs clear bilaterally, normal respiratory effort. ?CV.  Regular rate and rhythm, no JVD, rub or murmur. ?Abdomen.  Soft, nontender, nondistended, BS positive. ?CNS.  Alert and oriented .  No focal neurologic deficit. ?Extremities.  No edema, no cyanosis, pulses intact and symmetrical. ?Psychiatry.  Judgment and insight appears normal. ? ?Data Reviewed: ?I reviewed prior notes, labs and images ? ?Family Communication: Discussed with daughter at bedside ? ?Disposition: ?Status is: Inpatient ?Remains inpatient appropriate because: Severity of illness ? ? Planned Discharge Destination: To be determined ? ? ?Time spent: 50 minutes ? ?This record has been created using Systems analyst. Errors have been sought and corrected,but may not always be located. Such creation errors do not reflect on the standard of care. ? ?Author: ?Lorella Nimrod, MD ?08/01/2021 2:19 PM ? ?For on call review [email or web address].  ?

## 2021-08-01 NOTE — TOC Initial Note (Signed)
Transition of Care (TOC) - Initial/Assessment Note  ? ? ?Patient Details  ?Name: Catherine Orozco ?MRN: [id number] ?Date of Birth: 05-Jun-1940 ? ?Transition of Care (TOC) CM/SW Contact:    ?Anselm Pancoast, RN ?Phone Number: ?08/01/2021, 2:18 PM ? ?Clinical Narrative:                 ?LVMM for daughter to discuss dc plan and possible Hospice services.  ? ?  ?  ? ? ?Patient Goals and CMS Choice ?  ?  ?  ? ?Expected Discharge Plan and Services ?  ?  ?  ?  ?  ?                ?  ?  ?  ?  ?  ?  ?  ?  ?  ?  ? ?Prior Living Arrangements/Services ?  ?  ?  ?       ?  ?  ?  ?  ? ?Activities of Daily Living ?  ?  ? ?Permission Sought/Granted ?  ?  ?   ?   ?   ?   ? ?Emotional Assessment ?  ?  ?  ?  ?  ?  ? ?Admission diagnosis:  Atrial fibrillation with rapid ventricular response ([place]) [I48.91] ?Patient Active Problem List  ? Diagnosis Date Noted  ? Atrial fibrillation with rapid ventricular response ([place]) 07/30/2021  ? Controlled type 2 diabetes mellitus without complication ([place]) 51/88/4166  ? BP (high blood pressure) 10/25/2015  ? Dysuria 10/25/2015  ? Urethral caruncle 10/25/2015  ? Colon polyps   ? Anxiety state 04/13/2014  ? Abdominal pain, generalized 04/13/2014  ? Back ache 04/13/2014  ? Disorder of peripheral nervous system 04/13/2014  ? Major depressive disorder with single episode 04/13/2014  ? S/P laparoscopic cholecystectomy 01/12/2014  ? Diarrhea 01/01/2014  ? D (diarrhea) 01/01/2014  ? Symptomatic cholelithiasis 12/11/2013  ? Calculus of gallbladder 12/11/2013  ? Hyperlipidemia 08/07/2013  ? 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 Atrial fibrillation ([place]) 12/23/2012  ? Smoker 12/23/2012  ? Essential hypertension 12/23/2012  ? Anxiety   ? Depression   ? Insomnia   ? Chronic back pain   ? Peripheral neuropathy   ? ?PCP:  [name], MD ?Pharmacy:   ?[id number] - [place], [place] - [address] ?[address] ?[place] [address] ?Phone: [phone] Fax:  [phone] ? ?[facility] - [place], [place] - [address] ?[address] ?[place] [address] ?Phone: [phone] Fax: [phone] ? ?[facility] ([facility] ) - [place] [place] ?[address] ?[place] [address] ?Phone: [phone] Fax: [phone] ? ? ? ? ?Social Determinants of Health (SDOH) Interventions ?  ? ?Readmission Risk Interventions ?No flowsheet data found. ? ? ?

## 2021-08-01 NOTE — Assessment & Plan Note (Signed)
Patient refusing further management and would like to stay at home with hospice services.  She has a prior history of depression. ?Patient and daughter both are on the same page regarding palliative services. ?-Palliative care was consulted-will appreciate their help ?

## 2021-08-01 NOTE — Hospital Course (Addendum)
ICU transfer. ? ?Taken from prior notes. ?Ms. Catherine Orozco is a 81 year old with a history of mild dementia and age-related cognitive decline and paroxysmal atrial fibrillation who presented to [facility] on the day of admission via EMS with a complaint of weakness and discoloration on her legs, apparently noted by family the same day. ?Apparently patient was living alone, stopped taking all of her medications for for a while which include Eliquis, Percocet and Neurontin.  Daughter sent her are not when she did not get any response after multiple calls for couple of days.  Family was also concerned about declining health for some time. ? ?She was noted to be in atrial fibrillation with RVR requiring Cardizem infusion.  Cardiology was consulted due to difficult to control heart rate and she was admitted in ICU.  Heparin infusion was also started. ? ?Patient underwent CTA of lower extremities and found to have bilateral occlusion of SFA with some collaterals in place which shows that it is not acute.  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  She was noted to have moderate left hydronephrosis secondary to a 7 mm stone in the distal left ureter.  There was also noted some new nodularity and enlargement of left adrenal gland which could represent metastatic lesion and a suspicious opacity in lung which prompted CT chest which was positive for a 3 cm right lower lobe mass with probable right hilar lymphadenopathy, highly concerning for primary bronchogenic neoplasm.  There was also unusual nodular areas of architectural distortion in the left upper lobe, of uncertain etiology and significance. ? ?Lab work was significant for UA with concern of UTI, cultures pending, procalcitonin at 0.75, mildly elevated BUN at 34 and creatinine of 1.19, leukocytosis at 12.9>>11.4.  Blood cultures remain negative.  Urine cultures with E. coli, after discussing with daughter we gave her 1 dose of fosfomycin. ? ?PCCM has a  discussion with patient and daughter, they does not want to pursue further investigation for a final diagnosis and would like to go home with home hospice services. ?They also refused echocardiogram and further blood draws. ? ?Her heparin was switched with Eliquis but patient had 1 melanotic stool resulted in holding anticoagulation. ? ?TRH to resume care on 08/01/2021. ?Palliative care was consulted-patient and family does not want any more medical management and would like to proceed with hospice services, hospice was contacted and patient is being discharged home with hospice. ? ? ?

## 2021-08-02 DIAGNOSIS — I82462 Acute embolism and thrombosis of left calf muscular vein: Secondary | ICD-10-CM

## 2021-08-02 MED ORDER — FOSFOMYCIN TROMETHAMINE 3 G PO PACK
3.0000 g | PACK | Freq: Once | ORAL | Status: AC
  Administered 2021-08-02: 3 g via ORAL
  Filled 2021-08-02: qty 3

## 2021-08-02 MED ORDER — METOPROLOL SUCCINATE ER 25 MG PO TB24: 25.0000 mg | ORAL_TABLET | Freq: Every day | ORAL | 1 refills | Status: AC

## 2021-08-02 MED ORDER — MORPHINE SULFATE (CONCENTRATE) 10 MG/0.5ML PO SOLN: 5.0000 mg | ORAL | 0 refills | Status: AC | PRN

## 2021-08-02 MED ORDER — HYDRALAZINE HCL 50 MG PO TABS
25.0000 mg | ORAL_TABLET | Freq: Three times a day (TID) | ORAL | Status: DC
  Administered 2021-08-02: 25 mg via ORAL
  Filled 2021-08-02 (×2): qty 1

## 2021-08-02 MED ORDER — HYDRALAZINE HCL 25 MG PO TABS: 25.0000 mg | ORAL_TABLET | Freq: Three times a day (TID) | ORAL | 1 refills | Status: AC

## 2021-08-02 MED ORDER — LORAZEPAM 2 MG/ML PO CONC: 1.0000 mg | ORAL | 0 refills | Status: AC | PRN

## 2021-08-02 MED ORDER — HALOPERIDOL LACTATE 2 MG/ML PO CONC
0.5000 mg | ORAL | 0 refills | Status: AC | PRN
Start: 2021-08-02 — End: ?

## 2021-08-02 MED ORDER — NICOTINE 21 MG/24HR TD PT24
21.0000 mg | MEDICATED_PATCH | Freq: Every day | TRANSDERMAL | 0 refills | Status: AC
Start: 2021-08-03 — End: ?

## 2021-08-02 MED ORDER — MORPHINE SULFATE ER 15 MG PO TBCR
15.0000 mg | EXTENDED_RELEASE_TABLET | Freq: Two times a day (BID) | ORAL | Status: DC
  Filled 2021-08-02: qty 1

## 2021-08-02 NOTE — Care Management Important Message (Signed)
Important Message ? ?Patient Details  ?Name: [name] ?MRN: [id number] ?Date of Birth: 1941-03-08 ? ? ?Medicare Important Message Given:  Other (see comment) ? ?Patient is on comfort care and will discharge home with Hospice. Out of respect for the patient and family no Important Message from [facility] given. ? ? ?Juliann Pulse A Donzella Carrol ?08/02/2021, 8:08 AM ?

## 2021-08-02 NOTE — TOC Progression Note (Signed)
Transition of Care (TOC) - Progression Note  ? ? ?Patient Details  ?Name: Catherine Orozco ?MRN: [id number] ?Date of Birth: 11-25-1940 ? ?Transition of Care (TOC) CM/SW Contact  ?Anselm Pancoast, RN ?Phone Number: ?08/02/2021, 10:24 AM ? ?Clinical Narrative:    ?Spoke with [name], daughter and confirmed family is in agreeance with moving forward with home hospice. Patient prefers [facility] if possible. Discussed possible private duty options as well.  ? ? ?  ?  ? ?Expected Discharge Plan and Services ?  ?  ?  ?  ?  ?                ?  ?  ?  ?  ?  ?  ?  ?  ?  ?  ? ? ?Social Determinants of Health (SDOH) Interventions ?  ? ?Readmission Risk Interventions ?   ? View : No data to display.  ?  ?  ?  ? ? ?

## 2021-08-02 NOTE — TOC Progression Note (Signed)
Transition of Care (TOC) - Progression Note  ? ? ?Patient Details  ?Name: Kimba Lottes Ferrell ?MRN: [id number] ?Date of Birth: 01/22/1941 ? ?Transition of Care (TOC) CM/SW Contact  ?Gelene Recktenwald A Hiro Vipond, LCSW ?Phone Number: ?08/02/2021, 11:52 AM ? ?Clinical Narrative:   Pt dc home with hospice today. Lorayne Bender with [facility] is working with family.  ? ? ? ?  ?  ? ?Expected Discharge Plan and Services ?  ?  ?  ?  ?  ?                ?  ?  ?  ?  ?  ?  ?  ?  ?  ?  ? ? ?Social Determinants of Health (SDOH) Interventions ?  ? ?Readmission Risk Interventions ?   ? View : No data to display.  ?  ?  ?  ? ? ?

## 2021-08-02 NOTE — Progress Notes (Signed)
? ?                                                                                                                                                     ?                                                   ?Daily Progress Note  ? ?Patient Name: Catherine Orozco       Date: 08/02/2021 ?DOB: Jul 15, 1940  Age: 81 y.o. MRN#: [id number] ?Attending Physician: Lorella Nimrod, MD ?Primary Care Physician: [name], MD ?Admit Date: 07/30/2021 ? ?Reason for Consultation/Follow-up: Establishing goals of care ? ?Patient Profile/HPI:   81 y.o. female  with past medical history of mild dementia, paroxysmal a-fib admitted on 07/30/2021 with a fib with RVR, lower extremity DVT's with large clot burden, hydronephrosis r/t renal calculis, as well as findings on CT scan suspicious for malignancy. She was started on anticoag but this was complicated by GI bleeding. On consult with attending team- family and patient expressed their desire to go home and have no further workup. Palliative medicine consulted for [place].   ? ?Subjective: Chart reviewed.  No as needed medications overnight evaluated she is resting comfortably. Called and spoke with [place]. We discussed symptom management for [name].  [name] inquired about a long-acting pain medication.  I think this is reasonable.  [name] also inquired about simple dental care to what hospice can provide.  We discussed that often it is a private pay companies unless patient has a separate policy.  Noted that Manuela Schwartz the case manager had called her yesterday however [name] has not had a chance to speak to her.  [name] will call her today. ? ?Review of Systems  ?Unable to perform ROS: Acuity of condition  ? ? ?Physical Exam ?Vitals and nursing note reviewed.  ?Cardiovascular:  ?   Rate and Rhythm: Rhythm irregular.  ?Pulmonary:  ?   Effort: Pulmonary effort is normal.  ?Skin: ?   General: Skin is warm and dry.  ?Neurological:  ?   Comments: Sleeping  ?         ? ?Vital Signs: BP (!) 186/116 (BP  Location: Left Arm)   Pulse 80   Temp 97.9 ?F (36.6 ?C)   Resp 17   Ht 5\' 2"  (1.575 m)   Wt 60.3 kg   SpO2 97%   BMI 24.31 kg/m?  ?SpO2: SpO2: 97 % ?O2 Device: O2 Device: Room Air ?O2 Flow Rate:   ? ?Intake/output summary:  ?Intake/Output Summary (Last 24 hours) at 08/02/2021 1008 ?Last data filed at 08/01/2021 2100 ?Gross per 24 hour  ?Intake 580.95 ml  ?Output --  ?  Net 580.95 ml  ? ?LBM: Last BM Date : 08/01/21 ?Baseline Weight: Weight: 68 kg ?Most recent weight: Weight: 60.3 kg ? ?     ?Palliative Assessment/Data: PPS: 50% ? ? ? ? ? ?Patient Active Problem List  ? Diagnosis Date Noted  ? Goals of care, counseling/discussion 08/01/2021  ? Atrial fibrillation with rapid ventricular response ([place]) 07/30/2021  ? Controlled type 2 diabetes mellitus without complication ([place]) 23/53/6144  ? BP (high blood pressure) 10/25/2015  ? Dysuria 10/25/2015  ? Urethral caruncle 10/25/2015  ? Colon polyps   ? Anxiety state 04/13/2014  ? Abdominal pain, generalized 04/13/2014  ? Back ache 04/13/2014  ? Disorder of peripheral nervous system 04/13/2014  ? Major depressive disorder with single episode 04/13/2014  ? S/P laparoscopic cholecystectomy 01/12/2014  ? Diarrhea 01/01/2014  ? D (diarrhea) 01/01/2014  ? Symptomatic cholelithiasis 12/11/2013  ? Calculus of gallbladder 12/11/2013  ? Hyperlipidemia 08/07/2013  ? 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 Atrial fibrillation ([place]) 12/23/2012  ? Smoker 12/23/2012  ? Essential hypertension 12/23/2012  ? Anxiety   ? Depression   ? Insomnia   ? Chronic back pain   ? Peripheral neuropathy   ? ? ?Palliative Care Assessment & Plan  ? ? ?Assessment/Recommendations/Plan ? ?Continue full comfort measures only-TOC arranging hospice-- ?Symptom management-we will transition morphine to MS Contin 15 mg p.o. twice daily-please continue this on discharge; continue morphine solution 5 mg every 1 hour as needed for breakthrough pain or shortness of breath ? ? ?Code  Status: ?DNR ? ?Prognosis: ? < 6 months ? ?Discharge Planning: ?Home with Hospice ? ?Care plan was discussed with patient's daughter [name] ? ? ?Thank you for allowing the Palliative Medicine Team to assist in the care of this patient. ? ?Mariana Kaufman, AGNP-C ?Palliative Medicine ? ? ?Please contact Palliative Medicine Team phone at [phone] for questions and concerns.  ? ? ? ? ? ? ?

## 2021-08-02 NOTE — Progress Notes (Addendum)
[facility]) Hospital Liaison Note ?  ?Received request from Transitions of Care Manager, Shelton Silvas, for hospice services at home after discharge. Chart and patient information under review by [facility] physician. Hospice eligibility approved. ?  ?Spoke with daughter/Paula & sonto initiate education related to hospice philosophy, services, and team approach to care. [name] verbalized understanding of information given. Per discussion, the plan is for patient to discharge home via [facility] once cleared to DC & DME arrives.  ?  ?DME needs discussed. Patient has the following equipment in the home ?(Purchased privately): ?N/a ?Patient requests the following equipment for delivery: ?Hospital bed ?Chux--AV Visit ?Bedside table ? ?Address verified and is correct in the chart. Paula/[phone] is the family member to contact to arrange time of equipment delivery.  ?  ?Please send signed and completed DNR home with patient/family. Please provide prescriptions at discharge as needed to ensure ongoing symptom management.  ?  ?[facility] information and contact numbers given to family & above information shared with TOC. ?  ?Please call with any questions/concerns.  ?  ?Thank you for the opportunity to participate in this patient's care. ?  ?Daphene Calamity, MSW ?[place]  ?[phone] ? ?

## 2021-08-02 NOTE — Discharge Summary (Signed)
?Physician Discharge Summary ?  ?Patient: Catherine Orozco MRN: [id number] DOB: 25-Jan-1941  ?Admit date:     07/30/2021  ?Discharge date: 08/02/21  ?Discharge Physician: Lorella Nimrod  ? ?PCP: [name], MD  ? ?Recommendations at discharge:  ?Follow-up with your primary care provider if needed ? ?Discharge Diagnoses: ?Principal Problem: ?  Atrial fibrillation with rapid ventricular response ([place]) ?Active Problems: ?  Essential hypertension ?  Hyperlipidemia ?  Controlled type 2 diabetes mellitus without complication ([place]) ?  Goals of care, counseling/discussion ? ?Resolved Problems: ?  * No resolved hospital problems. * ? ?Hospital Course: ?ICU transfer. ? ?Taken from prior notes. ?Catherine Orozco is a 81 year old with a history of mild dementia and age-related cognitive decline and paroxysmal atrial fibrillation who presented to [facility] on the day of admission via EMS with a complaint of weakness and discoloration on her legs, apparently noted by family the same day. ?Apparently patient was living alone, stopped taking all of her medications for for a while which include Eliquis, Percocet and Neurontin.  Daughter sent her are not when she did not get any response after multiple calls for couple of days.  Family was also concerned about declining health for some time. ? ?She was noted to be in atrial fibrillation with RVR requiring Cardizem infusion.  Cardiology was consulted due to difficult to control heart rate and she was admitted in ICU.  Heparin infusion was also started. ? ?Patient underwent CTA of lower extremities and found to have bilateral occlusion of SFA with some collaterals in place which shows that it is not acute.  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  She was noted to have moderate left hydronephrosis secondary to a 7 mm stone in the distal left ureter.  There was also noted some new nodularity and enlargement of left adrenal gland which could represent metastatic lesion  and a suspicious opacity in lung which prompted CT chest which was positive for a 3 cm right lower lobe mass with probable right hilar lymphadenopathy, highly concerning for primary bronchogenic neoplasm.  There was also unusual nodular areas of architectural distortion in the left upper lobe, of uncertain etiology and significance. ? ?Lab work was significant for UA with concern of UTI, cultures pending, procalcitonin at 0.75, mildly elevated BUN at 34 and creatinine of 1.19, leukocytosis at 12.9>>11.4.  Blood cultures remain negative.  Urine cultures with E. coli, after discussing with daughter we gave her 1 dose of fosfomycin. ? ?PCCM has a discussion with patient and daughter, they does not want to pursue further investigation for a final diagnosis and would like to go home with home hospice services. ?They also refused echocardiogram and further blood draws. ? ?Her heparin was switched with Eliquis but patient had 1 melanotic stool resulted in holding anticoagulation. ? ?TRH to resume care on 08/01/2021. ?Palliative care was consulted-patient and family does not want any more medical management and would like to proceed with hospice services, hospice was contacted and patient is being discharged home with hospice. ? ?Assessment and Plan: ?* Atrial fibrillation with rapid ventricular response ([place]) ?Heart rate improved with Cardizem infusion, cardiology started her on metoprolol and Cardizem infusion was discontinued last night. ?Patient developed melanotic stool with anticoagulation so it is placed on hold. ?As patient refusing further care-cardiology signed off. ?Echocardiogram ordered- patient refused ?-Continue with metoprolol ?-Will need anticoagulation if changed her mind regarding hospice. ? ?Essential hypertension ?Currently blood pressure within goal. ?-Continue with  metoprolol ? ?Hyperlipidemia ?Patient has stopped taking her home statin and does not want to restart. ?-We will restart statin due to  extensive PAD if she decided against her current wishes of being hospice only. ? ?Controlled type 2 diabetes mellitus without complication ([place]) ?Not on any medications at home.  CBG within goal. ?Patient refusing further testing ? ?Goals of care, counseling/discussion ?Patient refusing further management and would like to stay at home with hospice services.  She has a prior history of depression. ?Patient and daughter both are on the same page regarding palliative services. ?-Palliative care was consulted-will appreciate their help ? ? ?Pain control - [employer] Controlled Substance Reporting System database was reviewed. and patient was instructed, not to drive, operate heavy machinery, perform activities at heights, swimming or participation in water activities or provide baby-sitting services while on Pain, Sleep and Anxiety Medications; until their outpatient Physician has advised to do so again. Also recommended to not to take more than prescribed Pain, Sleep and Anxiety Medications.  ? ?Consultants: Palliative care ?Procedures performed: None ?Disposition: Hospice care ?Diet recommendation:  ?Discharge Diet Orders (From admission, onward)  ? ?  Start     Ordered  ? 08/02/21 0000  Diet - low sodium heart healthy       ? 08/02/21 1603  ? ?  ?  ? ?  ? ?Regular diet ?DISCHARGE MEDICATION: ?Allergies as of 08/02/2021   ? ?   Reactions  ? Paxil [paroxetine Hcl]   ? Can't sleep  ? ?  ? ?  ?Medication List  ?  ? ?STOP taking these medications   ? ?apixaban 5 MG Tabs tablet ?Commonly known as: Eliquis ?  ?fenofibrate 145 MG tablet ?Commonly known as: TRICOR ?  ?hydrochlorothiazide 25 MG tablet ?Commonly known as: HYDRODIURIL ?  ?HYDROcodone-acetaminophen 5-325 MG tablet ?Commonly known as: NORCO/VICODIN ?  ? ?  ? ?TAKE these medications   ? ?DULoxetine 30 MG capsule ?Commonly known as: CYMBALTA ?TAKE 1 CAPSULE BY MOUTH EVERY DAY ?  ?gabapentin 600 MG tablet ?Commonly known as: NEURONTIN ?TAKE 1 TABLET BY MOUTH 3   TIMES DAILY ?  ?haloperidol 2 MG/ML solution ?Commonly known as: HALDOL ?Place 0.3 mLs (0.6 mg total) under the tongue every 4 (four) hours as needed for agitation (or delirium). ?  ?hydrALAZINE 25 MG tablet ?Commonly known as: APRESOLINE ?Take 1 tablet (25 mg total) by mouth every 8 (eight) hours. ?  ?hydrOXYzine 25 MG tablet ?Commonly known as: ATARAX ?TAKE 1 TABLET BY MOUTH EVERYDAY AT BEDTIME ?  ?LORazepam 2 MG/ML concentrated solution ?Commonly known as: ATIVAN ?Place 0.5 mLs (1 mg total) under the tongue every 4 (four) hours as needed for anxiety. ?  ?metoprolol succinate 25 MG 24 hr tablet ?Commonly known as: TOPROL-XL ?Take 1 tablet (25 mg total) by mouth daily. ?Start taking on: August 03, 2021 ?  ?morphine CONCENTRATE 10 MG/0.5ML Soln concentrated solution ?Take 0.25 mLs (5 mg total) by mouth every hour as needed for shortness of breath or moderate pain. ?  ?nicotine 21 mg/24hr patch ?Commonly known as: NICODERM CQ - dosed in mg/24 hours ?Place 1 patch (21 mg total) onto the skin daily. ?Start taking on: August 03, 2021 ?  ?simvastatin 20 MG tablet ?Commonly known as: ZOCOR ?TAKE 1 TABLET BY MOUTH EVERYDAY AT BEDTIME ?  ? ?  ? ? ?Discharge Exam: ?Filed Weights  ? 07/30/21 2150 07/31/21 0424 08/01/21 0406  ?Weight: 57 kg 56 kg 60.3 kg  ? ?General.  Lethargic  elderly lady, in no acute distress. ?Pulmonary.  Lungs clear bilaterally, normal respiratory effort. ?CV.  Regular rate and rhythm, no JVD, rub or murmur. ?Abdomen.  Soft, nontender, nondistended, BS positive. ?CNS.  Somnolent, following commands, no apparent focal deficit ?Extremities.  No edema, no cyanosis, pulses intact and symmetrical. ?Psychiatry.  Judgment and insight appears impaired ? ?Condition at discharge: stable ? ?The results of significant diagnostics from this hospitalization (including imaging, microbiology, ancillary and laboratory) are listed below for reference.  ? ?Imaging Studies: ?CT CHEST WO CONTRAST ? ?Result Date:  07/31/2021 ?CLINICAL DATA:  81 year old female history of pulmonary nodule. Followup study. EXAM: CT CHEST WITHOUT CONTRAST TECHNIQUE: Multidetector CT imaging of the chest was performed following the standard protocol wit

## 2021-08-02 NOTE — TOC Transition Note (Addendum)
Transition of Care (TOC) - CM/SW Discharge Note ? ? ?Patient Details  ?Name: Catherine Orozco ?MRN: [id number] ?Date of Birth: [birth year] ? ?Transition of Care (TOC) CM/SW Contact:  ?Corneisha Alvi A Esaw Knippel, LCSW ?Phone Number: ?08/02/2021, 3:31 PM ? ? ?Clinical Narrative:   CSW got confirmation from [place] with [facility] to set up transport. Address in the chart was confirmed by [place] with family. CSW has arranged EMS pickup.  ? ?Pt is third on EMS list, RN and MD updated. ? ? ?Final next level of care: [place] ?Barriers to Discharge: No Barriers Identified ? ? ?Patient Goals and CMS Choice ?  ?  ?  ? ?Discharge Placement ?  ?           ?Patient chooses bed at:  (home) ?Patient to be transferred to facility by: [facility] ?  ?Patient and family notified of of transfer: 08/02/21 ? ?Discharge Plan and Services ?  ?  ?           ?  ?  ?  ?  ?  ?  ?  ?  ?  ?  ? ?Social Determinants of Health (SDOH) Interventions ?  ? ? ?Readmission Risk Interventions ?   ? View : No data to display.  ?  ?  ?  ? ? ? ? ? ?

## 2021-08-03 ENCOUNTER — Telehealth: Payer: Self-pay

## 2021-08-03 LAB — URINE CULTURE: Culture: >=100000 — AB

## 2021-08-03 NOTE — Telephone Encounter (Signed)
Sherrie from [facility] call to ask if you would agreed to be pt's attending physcians?  ? ? ?

## 2021-08-04 ENCOUNTER — Telehealth: Payer: Self-pay | Admitting: Pharmacist

## 2021-08-04 ENCOUNTER — Telehealth: Payer: Self-pay

## 2021-08-04 LAB — CULTURE, BLOOD (ROUTINE X 2)
Culture: NO GROWTH
Culture: NO GROWTH
Special Requests: ADEQUATE
Special Requests: ADEQUATE

## 2021-08-04 NOTE — Telephone Encounter (Signed)
Spoke with Sherrie, per Dr Dennard Schaumann, ok.  ?

## 2021-08-04 NOTE — Telephone Encounter (Signed)
Catherine Hawking, RN with [facility], called asking for increase on the Morphin 5mg  to 10mg  to to help with pain, ok per Dr. Dennard Schaumann as long as she is hospice.  ? ?Also needed Haloperidol 2mg  every 6 hours as needed for agitation, ok per Dr. Dennard Schaumann.  ? ?Any questions call [phone] ?

## 2021-08-04 NOTE — Progress Notes (Signed)
? ? ?  Chronic Care Management ?Pharmacy Assistant  ? ?Name: Catherine Orozco  MRN: [id number] DOB: [birth year] ? ?Reason for Encounter: Medication Coordination Call ?  ? ?Recent office visits:  ?None ? ?Recent consult visits:  ?None ? ?Hospital visits:  ?07/30/2021 ED to Hospital Admission due to Atrial fibrillation with rapid ventricular response at [facility] ?Admit date:     07/30/2021  ?Discharge date: 08/02/21  ? ?-Patient refusing further management and would like to stay at home with hospice services.  She has a prior history of depression. ?Patient and daughter both are on the same page regarding palliative services. ?-Palliative care was consulted-will appreciate their help ?  ? ?Medications: ?Outpatient Encounter Medications as of 08/04/2021  ?Medication Sig  ? DULoxetine (CYMBALTA) 30 MG capsule TAKE 1 CAPSULE BY MOUTH EVERY DAY (Patient not taking: Reported on 07/31/2021)  ? gabapentin (NEURONTIN) 600 MG tablet TAKE 1 TABLET BY MOUTH 3  TIMES DAILY  ? haloperidol (HALDOL) 2 MG/ML solution Place 0.3 mLs (0.6 mg total) under the tongue every 4 (four) hours as needed for agitation (or delirium).  ? hydrALAZINE (APRESOLINE) 25 MG tablet Take 1 tablet (25 mg total) by mouth every 8 (eight) hours.  ? hydrOXYzine (ATARAX/VISTARIL) 25 MG tablet TAKE 1 TABLET BY MOUTH EVERYDAY AT BEDTIME  ? LORazepam (ATIVAN) 2 MG/ML concentrated solution Place 0.5 mLs (1 mg total) under the tongue every 4 (four) hours as needed for anxiety.  ? metoprolol succinate (TOPROL-XL) 25 MG 24 hr tablet Take 1 tablet (25 mg total) by mouth daily.  ? Morphine Sulfate (MORPHINE CONCENTRATE) 10 MG/0.5ML SOLN concentrated solution Take 0.25 mLs (5 mg total) by mouth every hour as needed for shortness of breath or moderate pain.  ? nicotine (NICODERM CQ - DOSED IN MG/24 HOURS) 21 mg/24hr patch Place 1 patch (21 mg total) onto the skin daily.  ? simvastatin (ZOCOR) 20 MG tablet TAKE 1 TABLET BY MOUTH EVERYDAY AT BEDTIME  ? ?No  facility-administered encounter medications on file as of 08/04/2021.  ? ? ?Patient's daughter declined medications from [facility] this month. She states the patient is currently "on hospice care" and is no longer taking any of her medications. She states the patient will no longer be needing medications from [facility]. ? ? ?April D Calhoun, [place] ?Clinical Pharmacist Assistant ?[phone]  ?

## 2021-08-16 ENCOUNTER — Telehealth: Payer: Self-pay | Admitting: Family Medicine

## 2021-08-16 NOTE — Telephone Encounter (Signed)
Received voicemail message yesterday evening from Judson Roch with [facility] to inform provider patient passed away 2021-08-24 at 3:15pm in her home. Please advise Judson Roch with any questions at [phone]. ?

## 2021-09-11 DEATH — deceased

## 2022-01-18 ENCOUNTER — Telehealth: Payer: Medicare Other
